# Patient Record
Sex: Female | Born: 1935
Health system: Southern US, Community
[De-identification: ages and names within clinical notes are randomized; demographics above are authoritative.]

## PROBLEM LIST (undated history)

## (undated) DIAGNOSIS — I639 Cerebral infarction, unspecified: Secondary | ICD-10-CM

## (undated) DIAGNOSIS — R011 Cardiac murmur, unspecified: Secondary | ICD-10-CM

## (undated) DIAGNOSIS — I10 Essential (primary) hypertension: Secondary | ICD-10-CM

## (undated) DIAGNOSIS — M1990 Unspecified osteoarthritis, unspecified site: Secondary | ICD-10-CM

## (undated) DIAGNOSIS — E785 Hyperlipidemia, unspecified: Secondary | ICD-10-CM

## (undated) DIAGNOSIS — E119 Type 2 diabetes mellitus without complications: Secondary | ICD-10-CM

## (undated) DIAGNOSIS — R55 Syncope and collapse: Secondary | ICD-10-CM

## (undated) DIAGNOSIS — F03B0 Unspecified dementia, moderate, without behavioral disturbance, psychotic disturbance, mood disturbance, and anxiety: Secondary | ICD-10-CM

## (undated) DIAGNOSIS — M109 Gout, unspecified: Secondary | ICD-10-CM

## (undated) DIAGNOSIS — R32 Unspecified urinary incontinence: Secondary | ICD-10-CM

## (undated) DIAGNOSIS — C801 Malignant (primary) neoplasm, unspecified: Secondary | ICD-10-CM

## (undated) DIAGNOSIS — F0390 Unspecified dementia without behavioral disturbance: Secondary | ICD-10-CM

## (undated) DIAGNOSIS — I4891 Unspecified atrial fibrillation: Secondary | ICD-10-CM

## (undated) DIAGNOSIS — I890 Lymphedema, not elsewhere classified: Secondary | ICD-10-CM

## (undated) DIAGNOSIS — I5020 Unspecified systolic (congestive) heart failure: Secondary | ICD-10-CM

## (undated) DIAGNOSIS — D649 Anemia, unspecified: Secondary | ICD-10-CM

## (undated) DIAGNOSIS — I340 Nonrheumatic mitral (valve) insufficiency: Secondary | ICD-10-CM

## (undated) DIAGNOSIS — R569 Unspecified convulsions: Secondary | ICD-10-CM

## (undated) HISTORY — DX: Unspecified osteoarthritis, unspecified site: M19.90

## (undated) HISTORY — DX: Unspecified urinary incontinence: R32

## (undated) HISTORY — PX: EYE SURGERY: SHX253

---

## 1997-02-19 HISTORY — PX: MASTECTOMY: SHX3

## 1997-08-27 HISTORY — PX: BREAST SURGERY: SHX581

## 1997-08-27 HISTORY — PX: MASTECTOMY: SHX3

## 1998-08-27 HISTORY — PX: CATARACT EXTRACTION: SUR2

## 2004-10-24 ENCOUNTER — Ambulatory Visit: Payer: Self-pay | Admitting: Oncology

## 2005-04-23 ENCOUNTER — Ambulatory Visit: Payer: Self-pay | Admitting: Oncology

## 2005-04-27 ENCOUNTER — Ambulatory Visit: Payer: Self-pay | Admitting: Oncology

## 2005-08-23 ENCOUNTER — Ambulatory Visit: Payer: Self-pay | Admitting: Surgery

## 2005-08-24 ENCOUNTER — Ambulatory Visit: Payer: Self-pay | Admitting: Oncology

## 2005-09-05 ENCOUNTER — Encounter: Payer: Self-pay | Admitting: Surgery

## 2005-09-27 ENCOUNTER — Encounter: Payer: Self-pay | Admitting: Surgery

## 2005-10-22 ENCOUNTER — Ambulatory Visit: Payer: Self-pay | Admitting: Oncology

## 2005-10-25 ENCOUNTER — Ambulatory Visit: Payer: Self-pay | Admitting: Oncology

## 2006-10-22 ENCOUNTER — Ambulatory Visit: Payer: Self-pay | Admitting: Oncology

## 2006-10-26 ENCOUNTER — Ambulatory Visit: Payer: Self-pay | Admitting: Oncology

## 2006-10-30 ENCOUNTER — Ambulatory Visit: Payer: Self-pay | Admitting: Ophthalmology

## 2006-11-06 ENCOUNTER — Ambulatory Visit: Payer: Self-pay | Admitting: Ophthalmology

## 2007-07-01 ENCOUNTER — Ambulatory Visit: Payer: Self-pay | Admitting: Ophthalmology

## 2007-07-01 ENCOUNTER — Other Ambulatory Visit: Payer: Self-pay

## 2007-07-09 ENCOUNTER — Ambulatory Visit: Payer: Self-pay | Admitting: Ophthalmology

## 2007-10-26 ENCOUNTER — Ambulatory Visit: Payer: Self-pay | Admitting: Oncology

## 2007-11-04 ENCOUNTER — Ambulatory Visit: Payer: Self-pay | Admitting: Oncology

## 2007-11-26 ENCOUNTER — Ambulatory Visit: Payer: Self-pay | Admitting: Oncology

## 2008-01-26 ENCOUNTER — Ambulatory Visit: Payer: Self-pay | Admitting: Oncology

## 2008-03-27 ENCOUNTER — Ambulatory Visit: Payer: Self-pay | Admitting: Oncology

## 2009-08-27 DIAGNOSIS — I639 Cerebral infarction, unspecified: Secondary | ICD-10-CM

## 2009-08-27 HISTORY — DX: Cerebral infarction, unspecified: I63.9

## 2010-04-14 ENCOUNTER — Emergency Department: Payer: Self-pay | Admitting: Emergency Medicine

## 2011-02-23 ENCOUNTER — Emergency Department: Payer: Self-pay | Admitting: Emergency Medicine

## 2014-09-07 DIAGNOSIS — I482 Chronic atrial fibrillation, unspecified: Secondary | ICD-10-CM | POA: Insufficient documentation

## 2014-09-07 DIAGNOSIS — R072 Precordial pain: Secondary | ICD-10-CM | POA: Diagnosis not present

## 2014-09-07 DIAGNOSIS — I5020 Unspecified systolic (congestive) heart failure: Secondary | ICD-10-CM | POA: Diagnosis not present

## 2014-09-07 DIAGNOSIS — E782 Mixed hyperlipidemia: Secondary | ICD-10-CM | POA: Diagnosis not present

## 2014-09-07 DIAGNOSIS — I10 Essential (primary) hypertension: Secondary | ICD-10-CM | POA: Diagnosis not present

## 2014-09-28 DIAGNOSIS — E785 Hyperlipidemia, unspecified: Secondary | ICD-10-CM | POA: Diagnosis not present

## 2014-09-28 DIAGNOSIS — I10 Essential (primary) hypertension: Secondary | ICD-10-CM | POA: Diagnosis not present

## 2014-09-28 DIAGNOSIS — E559 Vitamin D deficiency, unspecified: Secondary | ICD-10-CM | POA: Diagnosis not present

## 2014-09-28 DIAGNOSIS — Z23 Encounter for immunization: Secondary | ICD-10-CM | POA: Diagnosis not present

## 2014-10-04 DIAGNOSIS — Z853 Personal history of malignant neoplasm of breast: Secondary | ICD-10-CM | POA: Diagnosis not present

## 2014-10-04 DIAGNOSIS — M549 Dorsalgia, unspecified: Secondary | ICD-10-CM | POA: Diagnosis not present

## 2014-10-04 DIAGNOSIS — M546 Pain in thoracic spine: Secondary | ICD-10-CM | POA: Diagnosis not present

## 2014-10-04 DIAGNOSIS — E119 Type 2 diabetes mellitus without complications: Secondary | ICD-10-CM | POA: Diagnosis not present

## 2014-10-04 DIAGNOSIS — D6489 Other specified anemias: Secondary | ICD-10-CM | POA: Diagnosis not present

## 2014-10-04 DIAGNOSIS — I10 Essential (primary) hypertension: Secondary | ICD-10-CM | POA: Diagnosis not present

## 2014-10-05 DIAGNOSIS — R072 Precordial pain: Secondary | ICD-10-CM | POA: Diagnosis not present

## 2014-10-07 ENCOUNTER — Ambulatory Visit: Payer: Self-pay | Admitting: Internal Medicine

## 2014-10-07 DIAGNOSIS — M546 Pain in thoracic spine: Secondary | ICD-10-CM | POA: Diagnosis not present

## 2014-10-07 DIAGNOSIS — Z853 Personal history of malignant neoplasm of breast: Secondary | ICD-10-CM | POA: Diagnosis not present

## 2015-01-13 DIAGNOSIS — D6489 Other specified anemias: Secondary | ICD-10-CM | POA: Diagnosis not present

## 2015-01-13 DIAGNOSIS — I482 Chronic atrial fibrillation: Secondary | ICD-10-CM | POA: Diagnosis not present

## 2015-01-13 DIAGNOSIS — I10 Essential (primary) hypertension: Secondary | ICD-10-CM | POA: Diagnosis not present

## 2015-01-13 DIAGNOSIS — E119 Type 2 diabetes mellitus without complications: Secondary | ICD-10-CM | POA: Diagnosis not present

## 2015-01-17 ENCOUNTER — Other Ambulatory Visit: Payer: Self-pay | Admitting: Internal Medicine

## 2015-01-17 DIAGNOSIS — E119 Type 2 diabetes mellitus without complications: Secondary | ICD-10-CM | POA: Diagnosis not present

## 2015-01-17 DIAGNOSIS — I482 Chronic atrial fibrillation: Secondary | ICD-10-CM | POA: Diagnosis not present

## 2015-01-17 DIAGNOSIS — I10 Essential (primary) hypertension: Secondary | ICD-10-CM | POA: Diagnosis not present

## 2015-01-17 DIAGNOSIS — M546 Pain in thoracic spine: Secondary | ICD-10-CM

## 2015-01-17 DIAGNOSIS — I5020 Unspecified systolic (congestive) heart failure: Secondary | ICD-10-CM | POA: Diagnosis not present

## 2015-01-19 ENCOUNTER — Ambulatory Visit
Admission: RE | Admit: 2015-01-19 | Discharge: 2015-01-19 | Disposition: A | Discharge status: Home / Self Care | Payer: Self-pay | Source: Ambulatory Visit | Attending: Internal Medicine | Admitting: Internal Medicine

## 2015-02-11 ENCOUNTER — Ambulatory Visit: Admission: RE | Admit: 2015-02-11 | Payer: Commercial Managed Care - HMO | Source: Ambulatory Visit

## 2015-02-12 ENCOUNTER — Ambulatory Visit
Admission: RE | Admit: 2015-02-12 | Discharge: 2015-02-12 | Disposition: A | Discharge status: Home / Self Care | Payer: Commercial Managed Care - HMO | Source: Ambulatory Visit | Attending: Internal Medicine | Admitting: Internal Medicine

## 2015-02-12 DIAGNOSIS — M546 Pain in thoracic spine: Secondary | ICD-10-CM | POA: Diagnosis present

## 2015-02-12 DIAGNOSIS — M47896 Other spondylosis, lumbar region: Secondary | ICD-10-CM | POA: Insufficient documentation

## 2015-02-12 DIAGNOSIS — M5186 Other intervertebral disc disorders, lumbar region: Secondary | ICD-10-CM | POA: Diagnosis not present

## 2015-02-12 DIAGNOSIS — M5124 Other intervertebral disc displacement, thoracic region: Secondary | ICD-10-CM | POA: Diagnosis not present

## 2015-02-12 DIAGNOSIS — M47814 Spondylosis without myelopathy or radiculopathy, thoracic region: Secondary | ICD-10-CM | POA: Diagnosis not present

## 2015-02-17 DIAGNOSIS — E782 Mixed hyperlipidemia: Secondary | ICD-10-CM | POA: Diagnosis not present

## 2015-02-17 DIAGNOSIS — I10 Essential (primary) hypertension: Secondary | ICD-10-CM | POA: Diagnosis not present

## 2015-02-17 DIAGNOSIS — E559 Vitamin D deficiency, unspecified: Secondary | ICD-10-CM | POA: Diagnosis not present

## 2015-02-17 DIAGNOSIS — Z853 Personal history of malignant neoplasm of breast: Secondary | ICD-10-CM | POA: Diagnosis not present

## 2015-02-24 DIAGNOSIS — E119 Type 2 diabetes mellitus without complications: Secondary | ICD-10-CM | POA: Diagnosis not present

## 2015-02-24 DIAGNOSIS — B351 Tinea unguium: Secondary | ICD-10-CM | POA: Diagnosis not present

## 2015-05-13 DIAGNOSIS — E559 Vitamin D deficiency, unspecified: Secondary | ICD-10-CM | POA: Diagnosis not present

## 2015-05-13 DIAGNOSIS — I482 Chronic atrial fibrillation: Secondary | ICD-10-CM | POA: Diagnosis not present

## 2015-05-13 DIAGNOSIS — E119 Type 2 diabetes mellitus without complications: Secondary | ICD-10-CM | POA: Diagnosis not present

## 2015-05-13 DIAGNOSIS — M109 Gout, unspecified: Secondary | ICD-10-CM | POA: Diagnosis not present

## 2015-05-25 DIAGNOSIS — M1990 Unspecified osteoarthritis, unspecified site: Secondary | ICD-10-CM | POA: Diagnosis not present

## 2015-05-25 DIAGNOSIS — E119 Type 2 diabetes mellitus without complications: Secondary | ICD-10-CM | POA: Diagnosis not present

## 2015-05-25 DIAGNOSIS — Z23 Encounter for immunization: Secondary | ICD-10-CM | POA: Diagnosis not present

## 2015-05-25 DIAGNOSIS — M1A00X0 Idiopathic chronic gout, unspecified site, without tophus (tophi): Secondary | ICD-10-CM | POA: Diagnosis not present

## 2015-05-25 DIAGNOSIS — D6489 Other specified anemias: Secondary | ICD-10-CM | POA: Diagnosis not present

## 2015-05-25 DIAGNOSIS — I5020 Unspecified systolic (congestive) heart failure: Secondary | ICD-10-CM | POA: Diagnosis not present

## 2015-05-25 DIAGNOSIS — I482 Chronic atrial fibrillation: Secondary | ICD-10-CM | POA: Diagnosis not present

## 2015-05-25 DIAGNOSIS — I10 Essential (primary) hypertension: Secondary | ICD-10-CM | POA: Diagnosis not present

## 2015-08-24 DIAGNOSIS — B351 Tinea unguium: Secondary | ICD-10-CM | POA: Diagnosis not present

## 2015-08-24 DIAGNOSIS — E119 Type 2 diabetes mellitus without complications: Secondary | ICD-10-CM | POA: Diagnosis not present

## 2015-09-28 ENCOUNTER — Ambulatory Visit (INDEPENDENT_AMBULATORY_CARE_PROVIDER_SITE_OTHER): Payer: Commercial Managed Care - HMO

## 2015-09-28 ENCOUNTER — Encounter: Payer: Self-pay | Admitting: Emergency Medicine

## 2015-09-28 ENCOUNTER — Ambulatory Visit
Admission: EM | Admit: 2015-09-28 | Discharge: 2015-09-28 | Disposition: A | Discharge status: Home / Self Care | Payer: Commercial Managed Care - HMO | Attending: Family Medicine | Admitting: Family Medicine

## 2015-09-28 DIAGNOSIS — M10079 Idiopathic gout, unspecified ankle and foot: Secondary | ICD-10-CM

## 2015-09-28 DIAGNOSIS — M7989 Other specified soft tissue disorders: Secondary | ICD-10-CM | POA: Diagnosis not present

## 2015-09-28 DIAGNOSIS — M79672 Pain in left foot: Secondary | ICD-10-CM | POA: Diagnosis not present

## 2015-09-28 DIAGNOSIS — M109 Gout, unspecified: Secondary | ICD-10-CM

## 2015-09-28 HISTORY — DX: Essential (primary) hypertension: I10

## 2015-09-28 HISTORY — DX: Gout, unspecified: M10.9

## 2015-09-28 HISTORY — DX: Cerebral infarction, unspecified: I63.9

## 2015-09-28 HISTORY — DX: Type 2 diabetes mellitus without complications: E11.9

## 2015-09-28 HISTORY — DX: Malignant (primary) neoplasm, unspecified: C80.1

## 2015-09-28 MED ORDER — PREDNISONE 10 MG PO TABS: 20.0000 mg | ORAL_TABLET | Freq: Every day | ORAL | Status: DC

## 2015-09-28 NOTE — ED Notes (Signed)
Pt presents with left foot pain for two weeks. Unable to bear weight. Pt has hx of gout. No known injury.

## 2015-09-28 NOTE — ED Provider Notes (Signed)
CSN: [id number]     Arrival date & time 09/28/15  1557 History   First MD Initiated Contact with Patient 09/28/15 1625     Chief Complaint  Patient presents with  . Foot Pain   (Consider location/radiation/quality/duration/timing/severity/associated sxs/prior Treatment) HPI: Patient presents today with symptoms of left foot pain and swelling for the last 2 days. Patient states that she has a history of gout. She denies any injury or trauma that she can recall. She does state that weightbearing on the foot is very painful. She did start taking her colchicine this morning. She denies any other joints involved. She denies any abrasions to the area recently. She denies any fever. She denies any new medications recently. She is on hydrochlorothiazide. She also admits to eating red meat recently. She denies any alcohol use.  Past Medical History  Diagnosis Date  . Hypertension   . Diabetes mellitus without complication ([place])   . Gout   . Cancer ([place])   . Stroke [facility])    Past Surgical History  Procedure Laterality Date  . Breast surgery     No family history on file. Social History  Substance Use Topics  . Smoking status: Never Smoker   . Smokeless tobacco: None  . Alcohol Use: No   OB History    No data available     Review of Systems  Allergies  Codeine  Home Medications   Prior to Admission medications   Medication Sig Start Date End Date Taking? Authorizing Provider  amLODipine (NORVASC) 10 MG tablet Take 10 mg by mouth daily.   Yes Historical Provider, MD  cholecalciferol (VITAMIN D) 1000 units tablet Take 1,000 Units by mouth daily.   Yes Historical Provider, MD  ciprofloxacin (CIPRO) 500 MG tablet Take 500 mg by mouth 1 day or 1 dose.   Yes Historical Provider, MD  colchicine 0.6 MG tablet Take 0.6 mg by mouth daily.   Yes Historical Provider, MD  glipiZIDE (GLUCOTROL) 5 MG tablet Take by mouth daily before breakfast.   Yes Historical Provider, MD  hydrochlorothiazide  (HYDRODIURIL) 12.5 MG tablet Take 12.5 mg by mouth daily.   Yes Historical Provider, MD  lovastatin (MEVACOR) 40 MG tablet Take 40 mg by mouth at bedtime.   Yes Historical Provider, MD  Rivaroxaban (XARELTO) 15 MG TABS tablet Take 15 mg by mouth 2 (two) times daily with a meal.   Yes Historical Provider, MD   Meds Ordered and Administered this Visit  Medications - No data to display  BP 157/66 mmHg  Pulse 71  Temp(Src) 98 F (36.7 C) (Oral)  Resp 20  Ht 5\' 2"  (1.575 m)  Wt 139 lb (63.05 kg)  BMI 25.42 kg/m2  SpO2 100% No data found.   Physical Exam   GENERAL: NAD RESP: CTA B CARD: RRR EXTREM: L Foot- mild to moderate erythema, swelling, warmth, tenderness to medial aspect of the midfoot, full range of motion, no drainage from the site, no streaks, no calf tenderness bilaterally   ED Course  Procedures (including critical care time)  Labs Review Labs Reviewed - No data to display  Imaging Review No results found.    MDM  Left foot pain and swelling- x-rays were done which do not show any acute fracture, there are some degenerative changes noted in the midfoot. It does not appear to be an infectious etiology. Will treat patient for gout at this time. She can continue taking the colchicine which she started today or start the oral  prednisone that I have prescribed her. I have prescribed a very low dose of prednisone given the fact that patient is diabetic. I have asked that she drink plenty of water and avoid foods and beverages that can cause gout attacks. She will follow up with Dr. Caryl Comes if needed in the next day or two if symptoms are not improving.    Paulina Fusi, MD 09/28/15 1750

## 2015-10-14 ENCOUNTER — Encounter: Payer: Self-pay | Admitting: *Deleted

## 2015-10-14 ENCOUNTER — Ambulatory Visit
Admission: EM | Admit: 2015-10-14 | Discharge: 2015-10-14 | Disposition: A | Discharge status: Other Acute Inpatient Hospital | Payer: Commercial Managed Care - HMO | Attending: Family Medicine | Admitting: Family Medicine

## 2015-10-14 DIAGNOSIS — K5900 Constipation, unspecified: Secondary | ICD-10-CM | POA: Diagnosis not present

## 2015-10-14 DIAGNOSIS — K625 Hemorrhage of anus and rectum: Secondary | ICD-10-CM

## 2015-10-14 NOTE — Discharge Instructions (Signed)
Constipation, Adult Constipation is when a person:  Poops (has a bowel movement) less than 3 times a week.  Has a hard time pooping.  Has poop that is dry, hard, or bigger than normal. HOME CARE   Eat foods with a lot of fiber in them. This includes fruits, vegetables, beans, and whole grains such as brown rice.  Avoid fatty foods and foods with a lot of sugar. This includes french fries, hamburgers, cookies, candy, and soda.  If you are not getting enough fiber from food, take products with added fiber in them (supplements).  Drink enough fluid to keep your pee (urine) clear or pale yellow.  Exercise on a regular basis, or as told by your doctor.  Go to the restroom when you feel like you need to poop. Do not hold it.  Only take medicine as told by your doctor. Do not take medicines that help you poop (laxatives) without talking to your doctor first. GET HELP RIGHT AWAY IF:   You have bright red blood in your poop (stool).  Your constipation lasts more than 4 days or gets worse.  You have belly (abdominal) or butt (rectal) pain.  You have thin poop (as thin as a pencil).  You lose weight, and it cannot be explained. MAKE SURE YOU:   Understand these instructions.  Will watch your condition.  Will get help right away if you are not doing well or get worse.   This information is not intended to replace advice given to you by your health care provider. Make sure you discuss any questions you have with your health care provider.   Document Released: 01/30/2008 Document Revised: 09/03/2014 Document Reviewed: 05/25/2013 Elsevier Interactive Patient Education 2016 [employer]  

## 2015-10-14 NOTE — ED Provider Notes (Signed)
CSN: [id number]     Arrival date & time 10/14/15  1607 History   First MD Initiated Contact with Patient 10/14/15 1831    Nurses notes were reviewed. Chief Complaint  Patient presents with  . Rectal Bleeding  . Rectal Pain   patient is here because of rectal bleeding according to her daughter. Her daughter reports that her mother has been constant for several days. She able to produce a laxative which finally helped her bowels move. Once her bowels finally did move according to her mother reports heavy massive amount of bleeding this afternoon. The bleeding has stopped she denies any abdominal pain but the daughter Shelley Cameron had called her PCP and PCP suggested that she be evaluated.  PCP nurse felt that she should be evaluated at urgent care and they came here. Daughter is not sure why she came to the urgent care was directed to the urgent care that she didn't think we can do anything to help her and her mother is probably need to have some type of abdominal evaluation. I'm in agreement with the daughter and is October the daughter about seeing rectal bleeding square the bleeding starts back up and to be quite massive. Another concern is of the mother has never had a colonoscopy the daughter has been not the mother and now we have a 35 year old black female who's had rectal bleeding constipation and never had a colonoscopy. (Consider location/radiation/quality/duration/timing/severity/associated sxs/prior Treatment) Patient is a 82 y.o. female presenting with hematochezia. The history is provided by the patient. No language interpreter was used.  Rectal Bleeding Quality:  Bright red Amount:  Moderate Progression:  Worsening Chronicity:  New Context: constipation   Similar prior episodes: no   Relieved by:  Nothing Ineffective treatments:  None tried Associated symptoms: hematemesis   Associated symptoms: no abdominal pain, no fever, no light-headedness, no loss of consciousness and no recent  illness   Risk factors: no hx of colorectal cancer and no hx of colorectal surgery     Past Medical History  Diagnosis Date  . Hypertension   . Diabetes mellitus without complication ([place])   . Gout   . Cancer ([place])   . Stroke [facility])    Past Surgical History  Procedure Laterality Date  . Breast surgery     History reviewed. No pertinent family history. Social History  Substance Use Topics  . Smoking status: Never Smoker   . Smokeless tobacco: None  . Alcohol Use: No   OB History    No data available     Review of Systems  Constitutional: Negative for fever.  Gastrointestinal: Positive for hematochezia and hematemesis. Negative for abdominal pain.  Neurological: Negative for loss of consciousness and light-headedness.  All other systems reviewed and are negative.   Allergies  Codeine  Home Medications   Prior to Admission medications   Medication Sig Start Date End Date Taking? Authorizing Provider  amLODipine (NORVASC) 10 MG tablet Take 10 mg by mouth daily.   Yes Historical Provider, MD  cholecalciferol (VITAMIN D) 1000 units tablet Take 1,000 Units by mouth daily.   Yes Historical Provider, MD  colchicine 0.6 MG tablet Take 0.6 mg by mouth daily.   Yes Historical Provider, MD  glipiZIDE (GLUCOTROL) 5 MG tablet Take by mouth daily before breakfast.   Yes Historical Provider, MD  hydrochlorothiazide (HYDRODIURIL) 12.5 MG tablet Take 12.5 mg by mouth daily.   Yes Historical Provider, MD  lovastatin (MEVACOR) 40 MG tablet Take 40 mg by mouth at  bedtime.   Yes Historical Provider, MD  Rivaroxaban (XARELTO) 15 MG TABS tablet Take 15 mg by mouth 2 (two) times daily with a meal.   Yes Historical Provider, MD  ciprofloxacin (CIPRO) 500 MG tablet Take 500 mg by mouth 1 day or 1 dose.    Historical Provider, MD  predniSONE (DELTASONE) 10 MG tablet Take 2 tablets (20 mg total) by mouth daily. 09/28/15   Paulina Fusi, MD   Meds Ordered and Administered this Visit  Medications -  No data to display  BP 130/78 mmHg  Pulse 88  Temp(Src) 99.3 F (37.4 C) (Oral)  Resp 16  Ht 5\' 2"  (1.575 m)  Wt 140 lb (63.504 kg)  BMI 25.60 kg/m2  SpO2 99% No data found.   Physical Exam  Constitutional: She is oriented to person, place, and time. She appears well-developed and well-nourished.  HENT:  Head: Normocephalic.  Eyes: Conjunctivae are normal. Pupils are equal, round, and reactive to light.  Neck: Normal range of motion.  Cardiovascular: Normal rate and regular rhythm.   Pulmonary/Chest: Effort normal.  Abdominal: Soft. She exhibits no distension. There is no tenderness.  Musculoskeletal: Normal range of motion.  Neurological: She is alert and oriented to person, place, and time.  Skin: Skin is warm and dry.  Vitals reviewed.   ED Course  Procedures (including critical care time)  Labs Review Labs Reviewed - No data to display  Imaging Review No results found.   Visual Acuity Review  Right Eye Distance:   Left Eye Distance:   Bilateral Distance:    Right Eye Near:   Left Eye Near:    Bilateral Near:         MDM   1. Bright red rectal bleeding   2. Constipation, unspecified constipation type    because of the bright red bleeding history constipation I agree with the daughter that more extensive evaluation is probably warranted needed. We talked about going to the emergency room of choice and elected to go to [facility] ED for evaluation and treatment. We discussed about the best way to get there and patient will be taken there tonight.    Frederich Cha, MD 10/14/15 [phone]

## 2015-10-14 NOTE — ED Notes (Signed)
Onset of rectal bleeding and pain today, states constipation yesterday with laxative use and difficult bowel movements.

## 2015-10-17 DIAGNOSIS — I5020 Unspecified systolic (congestive) heart failure: Secondary | ICD-10-CM | POA: Diagnosis not present

## 2015-10-17 DIAGNOSIS — R1084 Generalized abdominal pain: Secondary | ICD-10-CM | POA: Diagnosis not present

## 2015-10-17 DIAGNOSIS — I10 Essential (primary) hypertension: Secondary | ICD-10-CM | POA: Diagnosis not present

## 2015-10-17 DIAGNOSIS — R109 Unspecified abdominal pain: Secondary | ICD-10-CM | POA: Diagnosis not present

## 2015-10-17 DIAGNOSIS — I482 Chronic atrial fibrillation: Secondary | ICD-10-CM | POA: Diagnosis not present

## 2015-10-17 DIAGNOSIS — K922 Gastrointestinal hemorrhage, unspecified: Secondary | ICD-10-CM | POA: Diagnosis not present

## 2015-10-17 DIAGNOSIS — Z853 Personal history of malignant neoplasm of breast: Secondary | ICD-10-CM | POA: Diagnosis not present

## 2015-10-17 DIAGNOSIS — E119 Type 2 diabetes mellitus without complications: Secondary | ICD-10-CM | POA: Diagnosis not present

## 2015-11-15 DIAGNOSIS — E119 Type 2 diabetes mellitus without complications: Secondary | ICD-10-CM | POA: Diagnosis not present

## 2015-11-15 DIAGNOSIS — I482 Chronic atrial fibrillation: Secondary | ICD-10-CM | POA: Diagnosis not present

## 2015-11-15 DIAGNOSIS — E559 Vitamin D deficiency, unspecified: Secondary | ICD-10-CM | POA: Diagnosis not present

## 2015-11-15 DIAGNOSIS — I10 Essential (primary) hypertension: Secondary | ICD-10-CM | POA: Diagnosis not present

## 2015-11-22 DIAGNOSIS — E782 Mixed hyperlipidemia: Secondary | ICD-10-CM | POA: Diagnosis not present

## 2015-11-22 DIAGNOSIS — I10 Essential (primary) hypertension: Secondary | ICD-10-CM | POA: Diagnosis not present

## 2015-11-22 DIAGNOSIS — Z23 Encounter for immunization: Secondary | ICD-10-CM | POA: Diagnosis not present

## 2015-11-22 DIAGNOSIS — I340 Nonrheumatic mitral (valve) insufficiency: Secondary | ICD-10-CM | POA: Diagnosis not present

## 2015-11-22 DIAGNOSIS — I5020 Unspecified systolic (congestive) heart failure: Secondary | ICD-10-CM | POA: Diagnosis not present

## 2015-11-22 DIAGNOSIS — D6489 Other specified anemias: Secondary | ICD-10-CM | POA: Diagnosis not present

## 2015-11-22 DIAGNOSIS — E119 Type 2 diabetes mellitus without complications: Secondary | ICD-10-CM | POA: Diagnosis not present

## 2015-11-22 DIAGNOSIS — I482 Chronic atrial fibrillation: Secondary | ICD-10-CM | POA: Diagnosis not present

## 2015-11-22 DIAGNOSIS — E559 Vitamin D deficiency, unspecified: Secondary | ICD-10-CM | POA: Diagnosis not present

## 2016-01-01 DIAGNOSIS — E119 Type 2 diabetes mellitus without complications: Secondary | ICD-10-CM | POA: Diagnosis not present

## 2016-01-10 ENCOUNTER — Other Ambulatory Visit: Payer: Self-pay | Admitting: Internal Medicine

## 2016-01-10 DIAGNOSIS — E119 Type 2 diabetes mellitus without complications: Secondary | ICD-10-CM | POA: Diagnosis not present

## 2016-01-10 DIAGNOSIS — I10 Essential (primary) hypertension: Secondary | ICD-10-CM | POA: Diagnosis not present

## 2016-01-10 DIAGNOSIS — R600 Localized edema: Secondary | ICD-10-CM | POA: Diagnosis not present

## 2016-01-10 DIAGNOSIS — I5020 Unspecified systolic (congestive) heart failure: Secondary | ICD-10-CM | POA: Diagnosis not present

## 2016-01-10 DIAGNOSIS — I482 Chronic atrial fibrillation: Secondary | ICD-10-CM | POA: Diagnosis not present

## 2016-01-10 DIAGNOSIS — M79671 Pain in right foot: Secondary | ICD-10-CM

## 2016-01-10 DIAGNOSIS — M1A00X0 Idiopathic chronic gout, unspecified site, without tophus (tophi): Secondary | ICD-10-CM | POA: Diagnosis not present

## 2016-01-12 ENCOUNTER — Ambulatory Visit
Admission: RE | Admit: 2016-01-12 | Discharge: 2016-01-12 | Disposition: A | Discharge status: Home / Self Care | Payer: Commercial Managed Care - HMO | Source: Ambulatory Visit | Attending: Internal Medicine | Admitting: Internal Medicine

## 2016-01-12 DIAGNOSIS — R600 Localized edema: Secondary | ICD-10-CM

## 2016-01-12 DIAGNOSIS — M79671 Pain in right foot: Secondary | ICD-10-CM | POA: Diagnosis not present

## 2016-01-19 DIAGNOSIS — M19071 Primary osteoarthritis, right ankle and foot: Secondary | ICD-10-CM | POA: Diagnosis not present

## 2016-01-19 DIAGNOSIS — M25571 Pain in right ankle and joints of right foot: Secondary | ICD-10-CM | POA: Diagnosis not present

## 2016-02-14 DIAGNOSIS — Z853 Personal history of malignant neoplasm of breast: Secondary | ICD-10-CM | POA: Diagnosis not present

## 2016-02-14 DIAGNOSIS — E119 Type 2 diabetes mellitus without complications: Secondary | ICD-10-CM | POA: Diagnosis not present

## 2016-02-14 DIAGNOSIS — I10 Essential (primary) hypertension: Secondary | ICD-10-CM | POA: Diagnosis not present

## 2016-02-14 DIAGNOSIS — E559 Vitamin D deficiency, unspecified: Secondary | ICD-10-CM | POA: Diagnosis not present

## 2016-02-14 DIAGNOSIS — I482 Chronic atrial fibrillation: Secondary | ICD-10-CM | POA: Diagnosis not present

## 2016-02-14 DIAGNOSIS — E782 Mixed hyperlipidemia: Secondary | ICD-10-CM | POA: Diagnosis not present

## 2016-02-14 DIAGNOSIS — D6489 Other specified anemias: Secondary | ICD-10-CM | POA: Diagnosis not present

## 2016-02-14 DIAGNOSIS — M1A00X0 Idiopathic chronic gout, unspecified site, without tophus (tophi): Secondary | ICD-10-CM | POA: Diagnosis not present

## 2016-02-14 DIAGNOSIS — R413 Other amnesia: Secondary | ICD-10-CM | POA: Diagnosis not present

## 2016-02-15 ENCOUNTER — Other Ambulatory Visit: Payer: Self-pay | Admitting: Internal Medicine

## 2016-02-15 DIAGNOSIS — R413 Other amnesia: Secondary | ICD-10-CM

## 2016-02-20 DIAGNOSIS — E119 Type 2 diabetes mellitus without complications: Secondary | ICD-10-CM | POA: Diagnosis not present

## 2016-02-20 DIAGNOSIS — B351 Tinea unguium: Secondary | ICD-10-CM | POA: Diagnosis not present

## 2016-02-23 ENCOUNTER — Ambulatory Visit: Payer: Commercial Managed Care - HMO | Attending: Internal Medicine

## 2016-03-05 ENCOUNTER — Ambulatory Visit
Admission: RE | Admit: 2016-03-05 | Discharge: 2016-03-05 | Disposition: A | Discharge status: Home / Self Care | Payer: Commercial Managed Care - HMO | Source: Ambulatory Visit | Attending: Internal Medicine | Admitting: Internal Medicine

## 2016-03-05 DIAGNOSIS — R413 Other amnesia: Secondary | ICD-10-CM | POA: Insufficient documentation

## 2016-03-05 DIAGNOSIS — I739 Peripheral vascular disease, unspecified: Secondary | ICD-10-CM | POA: Diagnosis not present

## 2016-03-05 DIAGNOSIS — G9389 Other specified disorders of brain: Secondary | ICD-10-CM | POA: Insufficient documentation

## 2016-03-05 DIAGNOSIS — G319 Degenerative disease of nervous system, unspecified: Secondary | ICD-10-CM | POA: Insufficient documentation

## 2016-04-10 DIAGNOSIS — I340 Nonrheumatic mitral (valve) insufficiency: Secondary | ICD-10-CM | POA: Diagnosis not present

## 2016-04-10 DIAGNOSIS — E119 Type 2 diabetes mellitus without complications: Secondary | ICD-10-CM | POA: Diagnosis not present

## 2016-04-10 DIAGNOSIS — I482 Chronic atrial fibrillation: Secondary | ICD-10-CM | POA: Diagnosis not present

## 2016-04-10 DIAGNOSIS — E782 Mixed hyperlipidemia: Secondary | ICD-10-CM | POA: Diagnosis not present

## 2016-04-10 DIAGNOSIS — I5020 Unspecified systolic (congestive) heart failure: Secondary | ICD-10-CM | POA: Diagnosis not present

## 2016-04-10 DIAGNOSIS — I10 Essential (primary) hypertension: Secondary | ICD-10-CM | POA: Diagnosis not present

## 2016-04-25 DIAGNOSIS — F0390 Unspecified dementia without behavioral disturbance: Secondary | ICD-10-CM | POA: Diagnosis not present

## 2016-04-25 DIAGNOSIS — E538 Deficiency of other specified B group vitamins: Secondary | ICD-10-CM | POA: Diagnosis not present

## 2016-05-17 DIAGNOSIS — M109 Gout, unspecified: Secondary | ICD-10-CM | POA: Diagnosis not present

## 2016-05-17 DIAGNOSIS — I10 Essential (primary) hypertension: Secondary | ICD-10-CM | POA: Diagnosis not present

## 2016-05-17 DIAGNOSIS — E119 Type 2 diabetes mellitus without complications: Secondary | ICD-10-CM | POA: Diagnosis not present

## 2016-05-17 DIAGNOSIS — D6489 Other specified anemias: Secondary | ICD-10-CM | POA: Diagnosis not present

## 2016-05-17 DIAGNOSIS — I482 Chronic atrial fibrillation: Secondary | ICD-10-CM | POA: Diagnosis not present

## 2016-05-21 DIAGNOSIS — D6489 Other specified anemias: Secondary | ICD-10-CM | POA: Diagnosis not present

## 2016-05-21 DIAGNOSIS — E119 Type 2 diabetes mellitus without complications: Secondary | ICD-10-CM | POA: Diagnosis not present

## 2016-05-21 DIAGNOSIS — R808 Other proteinuria: Secondary | ICD-10-CM | POA: Diagnosis not present

## 2016-05-21 DIAGNOSIS — I482 Chronic atrial fibrillation: Secondary | ICD-10-CM | POA: Diagnosis not present

## 2016-05-21 DIAGNOSIS — M109 Gout, unspecified: Secondary | ICD-10-CM | POA: Diagnosis not present

## 2016-05-21 DIAGNOSIS — I10 Essential (primary) hypertension: Secondary | ICD-10-CM | POA: Diagnosis not present

## 2016-05-24 DIAGNOSIS — I482 Chronic atrial fibrillation: Secondary | ICD-10-CM | POA: Diagnosis not present

## 2016-05-24 DIAGNOSIS — M1A00X0 Idiopathic chronic gout, unspecified site, without tophus (tophi): Secondary | ICD-10-CM | POA: Diagnosis not present

## 2016-05-24 DIAGNOSIS — E559 Vitamin D deficiency, unspecified: Secondary | ICD-10-CM | POA: Diagnosis not present

## 2016-05-24 DIAGNOSIS — I10 Essential (primary) hypertension: Secondary | ICD-10-CM | POA: Diagnosis not present

## 2016-05-24 DIAGNOSIS — I340 Nonrheumatic mitral (valve) insufficiency: Secondary | ICD-10-CM | POA: Diagnosis not present

## 2016-05-24 DIAGNOSIS — E782 Mixed hyperlipidemia: Secondary | ICD-10-CM | POA: Diagnosis not present

## 2016-05-24 DIAGNOSIS — E119 Type 2 diabetes mellitus without complications: Secondary | ICD-10-CM | POA: Diagnosis not present

## 2016-05-24 DIAGNOSIS — F0390 Unspecified dementia without behavioral disturbance: Secondary | ICD-10-CM | POA: Diagnosis not present

## 2016-05-24 DIAGNOSIS — I5020 Unspecified systolic (congestive) heart failure: Secondary | ICD-10-CM | POA: Diagnosis not present

## 2016-05-24 DIAGNOSIS — Z23 Encounter for immunization: Secondary | ICD-10-CM | POA: Diagnosis not present

## 2016-05-30 ENCOUNTER — Emergency Department: Payer: Commercial Managed Care - HMO

## 2016-05-30 ENCOUNTER — Encounter: Payer: Self-pay | Admitting: *Deleted

## 2016-05-30 ENCOUNTER — Observation Stay
Admission: EM | Admit: 2016-05-30 | Discharge: 2016-06-01 | Disposition: A | Discharge status: Home / Self Care | Payer: Commercial Managed Care - HMO | Attending: Internal Medicine | Admitting: Internal Medicine

## 2016-05-30 DIAGNOSIS — Z7901 Long term (current) use of anticoagulants: Secondary | ICD-10-CM | POA: Diagnosis not present

## 2016-05-30 DIAGNOSIS — Z792 Long term (current) use of antibiotics: Secondary | ICD-10-CM | POA: Insufficient documentation

## 2016-05-30 DIAGNOSIS — E785 Hyperlipidemia, unspecified: Secondary | ICD-10-CM | POA: Diagnosis not present

## 2016-05-30 DIAGNOSIS — Z79899 Other long term (current) drug therapy: Secondary | ICD-10-CM | POA: Insufficient documentation

## 2016-05-30 DIAGNOSIS — E119 Type 2 diabetes mellitus without complications: Secondary | ICD-10-CM | POA: Insufficient documentation

## 2016-05-30 DIAGNOSIS — R079 Chest pain, unspecified: Secondary | ICD-10-CM | POA: Diagnosis not present

## 2016-05-30 DIAGNOSIS — Z8673 Personal history of transient ischemic attack (TIA), and cerebral infarction without residual deficits: Secondary | ICD-10-CM | POA: Diagnosis not present

## 2016-05-30 DIAGNOSIS — M109 Gout, unspecified: Secondary | ICD-10-CM | POA: Insufficient documentation

## 2016-05-30 DIAGNOSIS — R0602 Shortness of breath: Secondary | ICD-10-CM | POA: Diagnosis not present

## 2016-05-30 DIAGNOSIS — L308 Other specified dermatitis: Secondary | ICD-10-CM | POA: Diagnosis not present

## 2016-05-30 DIAGNOSIS — R0789 Other chest pain: Principal | ICD-10-CM | POA: Insufficient documentation

## 2016-05-30 DIAGNOSIS — Z7984 Long term (current) use of oral hypoglycemic drugs: Secondary | ICD-10-CM | POA: Diagnosis not present

## 2016-05-30 DIAGNOSIS — F0390 Unspecified dementia without behavioral disturbance: Secondary | ICD-10-CM | POA: Insufficient documentation

## 2016-05-30 DIAGNOSIS — Z853 Personal history of malignant neoplasm of breast: Secondary | ICD-10-CM | POA: Diagnosis not present

## 2016-05-30 DIAGNOSIS — I482 Chronic atrial fibrillation: Secondary | ICD-10-CM | POA: Diagnosis not present

## 2016-05-30 DIAGNOSIS — N9089 Other specified noninflammatory disorders of vulva and perineum: Secondary | ICD-10-CM | POA: Diagnosis not present

## 2016-05-30 DIAGNOSIS — G8918 Other acute postprocedural pain: Secondary | ICD-10-CM | POA: Diagnosis not present

## 2016-05-30 DIAGNOSIS — I110 Hypertensive heart disease with heart failure: Secondary | ICD-10-CM | POA: Diagnosis not present

## 2016-05-30 DIAGNOSIS — I5022 Chronic systolic (congestive) heart failure: Secondary | ICD-10-CM | POA: Insufficient documentation

## 2016-05-30 DIAGNOSIS — Z7952 Long term (current) use of systemic steroids: Secondary | ICD-10-CM | POA: Insufficient documentation

## 2016-05-30 DIAGNOSIS — I10 Essential (primary) hypertension: Secondary | ICD-10-CM | POA: Diagnosis not present

## 2016-05-30 HISTORY — DX: Unspecified systolic (congestive) heart failure: I50.20

## 2016-05-30 HISTORY — DX: Unspecified dementia without behavioral disturbance: F03.90

## 2016-05-30 HISTORY — DX: Unspecified dementia, moderate, without behavioral disturbance, psychotic disturbance, mood disturbance, and anxiety: F03.B0

## 2016-05-30 HISTORY — DX: Nonrheumatic mitral (valve) insufficiency: I34.0

## 2016-05-30 HISTORY — DX: Unspecified atrial fibrillation: I48.91

## 2016-05-30 LAB — GLUCOSE, CAPILLARY: Glucose-Capillary: 103 mg/dL — ABNORMAL HIGH (ref 65–99)

## 2016-05-30 LAB — BASIC METABOLIC PANEL
ANION GAP: 9 (ref 5–15)
BUN: 24 mg/dL — ABNORMAL HIGH (ref 6–20)
CALCIUM: 9.2 mg/dL (ref 8.9–10.3)
CO2: 31 mmol/L (ref 22–32)
Chloride: 101 mmol/L (ref 101–111)
Creatinine, Ser: 1.25 mg/dL — ABNORMAL HIGH (ref 0.44–1.00)
GFR calc non Af Amer: 39 mL/min — ABNORMAL LOW (ref >60)
GFR, EST AFRICAN AMERICAN: 45 mL/min — AB (ref >60)
Glucose, Bld: 113 mg/dL — ABNORMAL HIGH (ref 65–99)
Potassium: 3.1 mmol/L — ABNORMAL LOW (ref 3.5–5.1)
SODIUM: 141 mmol/L (ref 135–145)

## 2016-05-30 LAB — CBC
HCT: 40 % (ref 35.0–47.0)
HEMOGLOBIN: 13.8 g/dL (ref 12.0–16.0)
MCH: 28.7 pg (ref 26.0–34.0)
MCHC: 34.4 g/dL (ref 32.0–36.0)
MCV: 83.4 fL (ref 80.0–100.0)
Platelets: 202 10*3/uL (ref 150–440)
RBC: 4.79 MIL/uL (ref 3.80–5.20)
RDW: 15 % — ABNORMAL HIGH (ref 11.5–14.5)
WBC: 9.7 10*3/uL (ref 3.6–11.0)

## 2016-05-30 LAB — TROPONIN I

## 2016-05-30 MED ORDER — SODIUM CHLORIDE 0.9% FLUSH: 3.0000 mL | Freq: Two times a day (BID) | INTRAVENOUS | Status: DC

## 2016-05-30 MED ORDER — POTASSIUM CHLORIDE CRYS ER 20 MEQ PO TBCR
40.0000 meq | EXTENDED_RELEASE_TABLET | Freq: Once | ORAL | Status: AC
  Administered 2016-05-30: 40 meq via ORAL
  Filled 2016-05-30: qty 2

## 2016-05-30 MED ORDER — INSULIN ASPART 100 UNIT/ML ~~LOC~~ SOLN: 0.0000 [IU] | Freq: Three times a day (TID) | SUBCUTANEOUS | Status: DC

## 2016-05-30 MED ORDER — ALBUTEROL SULFATE (2.5 MG/3ML) 0.083% IN NEBU: 2.5000 mg | INHALATION_SOLUTION | RESPIRATORY_TRACT | Status: DC | PRN

## 2016-05-30 MED ORDER — RIVAROXABAN 15 MG PO TABS
15.0000 mg | ORAL_TABLET | Freq: Two times a day (BID) | ORAL | Status: DC
  Administered 2016-05-31: 15 mg via ORAL
  Filled 2016-05-30: qty 1

## 2016-05-30 MED ORDER — INSULIN ASPART 100 UNIT/ML ~~LOC~~ SOLN: 0.0000 [IU] | Freq: Every day | SUBCUTANEOUS | Status: DC

## 2016-05-30 MED ORDER — ACETAMINOPHEN 650 MG RE SUPP: 650.0000 mg | Freq: Four times a day (QID) | RECTAL | Status: DC | PRN

## 2016-05-30 MED ORDER — SODIUM CHLORIDE 0.9% FLUSH
3.0000 mL | Freq: Two times a day (BID) | INTRAVENOUS | Status: DC
  Administered 2016-05-30 – 2016-06-01 (×4): 3 mL via INTRAVENOUS

## 2016-05-30 MED ORDER — HYDROCODONE-ACETAMINOPHEN 5-325 MG PO TABS: 1.0000 | ORAL_TABLET | ORAL | Status: DC | PRN

## 2016-05-30 MED ORDER — METOPROLOL SUCCINATE ER 25 MG PO TB24
25.0000 mg | ORAL_TABLET | Freq: Every day | ORAL | Status: DC
  Administered 2016-05-30: 25 mg via ORAL
  Filled 2016-05-30: qty 1

## 2016-05-30 MED ORDER — SODIUM CHLORIDE 0.9% FLUSH: 3.0000 mL | INTRAVENOUS | Status: DC | PRN

## 2016-05-30 MED ORDER — GLIPIZIDE 5 MG PO TABS
5.0000 mg | ORAL_TABLET | Freq: Every day | ORAL | Status: DC
  Administered 2016-05-31 – 2016-06-01 (×2): 5 mg via ORAL
  Filled 2016-05-30 (×2): qty 1

## 2016-05-30 MED ORDER — POLYETHYLENE GLYCOL 3350 17 G PO PACK: 17.0000 g | PACK | Freq: Every day | ORAL | Status: DC | PRN

## 2016-05-30 MED ORDER — SODIUM CHLORIDE 0.9 % IV SOLN: 250.0000 mL | INTRAVENOUS | Status: DC | PRN

## 2016-05-30 MED ORDER — AMLODIPINE BESYLATE 10 MG PO TABS
10.0000 mg | ORAL_TABLET | Freq: Every day | ORAL | Status: DC
  Administered 2016-05-30 – 2016-05-31 (×2): 10 mg via ORAL
  Filled 2016-05-30 (×2): qty 1

## 2016-05-30 MED ORDER — PRAVASTATIN SODIUM 20 MG PO TABS
20.0000 mg | ORAL_TABLET | Freq: Every day | ORAL | Status: DC
  Administered 2016-05-31: 20 mg via ORAL
  Filled 2016-05-30: qty 1

## 2016-05-30 MED ORDER — NITROGLYCERIN 0.4 MG SL SUBL: 0.4000 mg | SUBLINGUAL_TABLET | SUBLINGUAL | Status: DC | PRN

## 2016-05-30 MED ORDER — DONEPEZIL HCL 5 MG PO TABS
5.0000 mg | ORAL_TABLET | Freq: Every day | ORAL | Status: DC
  Administered 2016-05-30 – 2016-05-31 (×2): 5 mg via ORAL
  Filled 2016-05-30 (×2): qty 1

## 2016-05-30 MED ORDER — ACETAMINOPHEN 325 MG PO TABS
650.0000 mg | ORAL_TABLET | Freq: Four times a day (QID) | ORAL | Status: DC | PRN
  Administered 2016-05-31 (×3): 650 mg via ORAL
  Filled 2016-05-30 (×3): qty 2

## 2016-05-30 MED ORDER — BISACODYL 10 MG RE SUPP
10.0000 mg | Freq: Every day | RECTAL | Status: DC | PRN
  Filled 2016-05-30: qty 1

## 2016-05-30 MED ORDER — ASPIRIN EC 81 MG PO TBEC
81.0000 mg | DELAYED_RELEASE_TABLET | Freq: Every day | ORAL | Status: DC
  Administered 2016-05-30 – 2016-06-01 (×3): 81 mg via ORAL
  Filled 2016-05-30 (×3): qty 1

## 2016-05-30 NOTE — ED Triage Notes (Signed)
Pt to triage via wheelchair. Pt had chest pain earlier today.  No pain on arrival to triage.  No sob. nonradiaiting pain.  No n/v/d. No diaphoresis.  Pt alert. Pt had vaginal biopsy today.

## 2016-05-30 NOTE — ED Provider Notes (Signed)
[facility] Emergency Department Provider Note    First MD Initiated Contact with Patient 05/30/16 1703     (approximate)  I have reviewed the triage vital signs and the nursing notes.   HISTORY  Chief Complaint Chest Pain    HPI Shelley Cameron is a 82 y.o. female with history of breast cancer presents with brief right-sided chest pain and pressure that started at 4 PM. Patient had a vaginal biopsy performed in clinic today. While going of clinic patient was complaining of shortness of breath and chest pain. States the pain lasted roughly 5-10 minutes and was while at rest. Daughter witnessed the episode states that she did look very uncomfortable but did not have any diaphoresis nausea or vomiting. No recent fevers. She is on Xarelto. Has been taking medications as prescribed. Currently denies any chest pain or pressure.   Past Medical History:  Diagnosis Date  . Cancer ([place])   . Diabetes mellitus without complication ([place])   . Gout   . Hypertension   . Stroke [facility])     There are no active problems to display for this patient.   Past Surgical History:  Procedure Laterality Date  . BREAST SURGERY      Prior to Admission medications   Medication Sig Start Date End Date Taking? Authorizing Provider  amLODipine (NORVASC) 10 MG tablet Take 10 mg by mouth daily.    Historical Provider, MD  cholecalciferol (VITAMIN D) 1000 units tablet Take 1,000 Units by mouth daily.    Historical Provider, MD  ciprofloxacin (CIPRO) 500 MG tablet Take 500 mg by mouth 1 day or 1 dose.    Historical Provider, MD  colchicine 0.6 MG tablet Take 0.6 mg by mouth daily.    Historical Provider, MD  glipiZIDE (GLUCOTROL) 5 MG tablet Take by mouth daily before breakfast.    Historical Provider, MD  hydrochlorothiazide (HYDRODIURIL) 12.5 MG tablet Take 12.5 mg by mouth daily.    Historical Provider, MD  lovastatin (MEVACOR) 40 MG tablet Take 40 mg by mouth at bedtime.     Historical Provider, MD  predniSONE (DELTASONE) 10 MG tablet Take 2 tablets (20 mg total) by mouth daily. 09/28/15   Paulina Fusi, MD  Rivaroxaban (XARELTO) 15 MG TABS tablet Take 15 mg by mouth 2 (two) times daily with a meal.    Historical Provider, MD    Allergies Codeine  No family history on file.  Social History Social History  Substance Use Topics  . Smoking status: Never Smoker  . Smokeless tobacco: Never Used  . Alcohol use No    Review of Systems Patient denies headaches, rhinorrhea, blurry vision, numbness, shortness of breath, chest pain, edema, cough, abdominal pain, nausea, vomiting, diarrhea, dysuria, fevers, rashes or hallucinations unless otherwise stated above in HPI. ____________________________________________   PHYSICAL EXAM:  VITAL SIGNS: Vitals:   05/30/16 1637 05/30/16 1730  BP: (!) 149/72 120/75  Pulse: 62 (!) 51  Resp: 20 (!) 21  Temp: 98 F (36.7 C)     Constitutional: Alert and oriented. Well appearing and in no acute distress. Eyes: Conjunctivae are normal. PERRL. EOMI. Head: Atraumatic. Nose: No congestion/rhinnorhea. Mouth/Throat: Mucous membranes are moist.  Oropharynx non-erythematous. Neck: No stridor. Painless ROM. No cervical spine tenderness to palpation Hematological/Lymphatic/Immunilogical: No cervical lymphadenopathy. Cardiovascular: Normal rate, regular rhythm. Grossly normal heart sounds.  Good peripheral circulation. Respiratory: Normal respiratory effort.  No retractions. Lungs CTAB. Gastrointestinal: Soft and nontender. No distention. No abdominal bruits. No CVA tenderness.  Genitourinary:  Musculoskeletal: No lower extremity tenderness nor edema.  No joint effusions. Neurologic:  Normal speech and language. No gross focal neurologic deficits are appreciated. No gait instability. Skin:  Skin is warm, dry and intact. No rash noted. Psychiatric: Mood and affect are normal. Speech and behavior are  normal.  ____________________________________________   LABS (all labs ordered are listed, but only abnormal results are displayed)  Results for orders placed or performed during the hospital encounter of 05/30/16 (from the past 24 hour(s))  Basic metabolic panel     Status: Abnormal   Collection Time: 05/30/16  4:41 PM  Result Value Ref Range   Sodium 141 135 - 145 mmol/L   Potassium 3.1 (L) 3.5 - 5.1 mmol/L   Chloride 101 101 - 111 mmol/L   CO2 31 22 - 32 mmol/L   Glucose, Bld 113 (H) 65 - 99 mg/dL   BUN 24 (H) 6 - 20 mg/dL   Creatinine, Ser 1.25 (H) 0.44 - 1.00 mg/dL   Calcium 9.2 8.9 - 10.3 mg/dL   GFR calc non Af Amer 39 (L) >60 mL/min   GFR calc Af Amer 45 (L) >60 mL/min   Anion gap 9 5 - 15  CBC     Status: Abnormal   Collection Time: 05/30/16  4:41 PM  Result Value Ref Range   WBC 9.7 3.6 - 11.0 K/uL   RBC 4.79 3.80 - 5.20 MIL/uL   Hemoglobin 13.8 12.0 - 16.0 g/dL   HCT 40.0 35.0 - 47.0 %   MCV 83.4 80.0 - 100.0 fL   MCH 28.7 26.0 - 34.0 pg   MCHC 34.4 32.0 - 36.0 g/dL   RDW 15.0 (H) 11.5 - 14.5 %   Platelets 202 150 - 440 K/uL  Troponin I     Status: None   Collection Time: 05/30/16  4:41 PM  Result Value Ref Range   Troponin I <0.03 <0.03 ng/mL   ____________________________________________  EKG My review and personal interpretation at Time: 16:36   Indication: chest pain  Rate: 55  Rhythm: afib Other: no acute ischemic changes, otherwise normal intervals ____________________________________________  RADIOLOGY  I personally reviewed all radiographic images ordered to evaluate for the above acute complaints and reviewed radiology reports and findings.  These findings were personally discussed with the patient.  Please see medical record for radiology report.  ____________________________________________   PROCEDURES  Procedure(s) performed: none    Critical Care performed: no ____________________________________________   INITIAL IMPRESSION /  ASSESSMENT AND PLAN / ED COURSE  Pertinent labs & imaging results that were available during my care of the patient were reviewed by me and considered in my medical decision making (see chart for details).  DDX: ACS, pericarditis, esophagitis, boerhaaves, pe, dissection, pna, bronchitis, costochondritis   ADAYSIA FAIDLEY is a 82 y.o. who presents to the ED with episode of chest pain earlier today. Patient does have multiple risk factors for cardiac disease including hypertension, hyperlipidemia and diabetes. Had a stress test every year and a half ago. Has never had pain like this before. Not consistent with PE as she is on Zaroxolyn has no hypoxia or tachycardia at this time. He EKG shows A. fib with nonspecific ST changes no acute ischemia. Troponin is negative. Chest x-ray shows no acute infiltrate. Her abdominal exam is soft and benign. Based on these findings do feel patient will require observation for further risk stratification.  Have discussed with the patient and available family all diagnostics and treatments performed thus  far and all questions were answered to the best of my ability. The patient demonstrates understanding and agreement with plan.   Clinical Course     ____________________________________________   FINAL CLINICAL IMPRESSION(S) / ED DIAGNOSES  Final diagnoses:  Chest pain, unspecified type      NEW MEDICATIONS STARTED DURING THIS VISIT:  New Prescriptions   No medications on file     Note:  This document was prepared using Dragon voice recognition software and may include unintentional dictation errors.    Merlyn Lot, MD 05/30/16 2207

## 2016-05-30 NOTE — ED Notes (Addendum)
2 unsuccessful IV attempts in patient's left wrist. Let float nurse IV access is needed.

## 2016-05-30 NOTE — ED Notes (Signed)
Christine, RN attempted 1 IV stick unsuccessfully.

## 2016-05-30 NOTE — ED Notes (Signed)
ED Nurse called to find out if bed was ready yet. Nurse told it would be a little longer while they cleaned another room for her.

## 2016-05-30 NOTE — H&P (Signed)
[name]    MR#:  [id number]  DATE OF BIRTH:  05-Jul-1930  DATE OF ADMISSION:  05/30/2016  PRIMARY CARE PHYSICIAN: Adin Hector, MD   REQUESTING/REFERRING PHYSICIAN: Dr. Quentin Cornwall  CHIEF COMPLAINT:   Chief Complaint  Patient presents with  . Chest Pain    HISTORY OF PRESENT ILLNESS:  Shelley Cameron  is a 82 y.o. female with a known history of CVA, atrial fibrillation, hypertension, diabetes presents to the emergency room due to acute onset of central chest pain while watching television at home. This pain lasted all through her journey in a private car to the emergency room and resolve on its own. Vision is a poor historian due to dementia and history obtained from daughter at bedside, ER staff and reviewing old records. Daughter mentions that she noticed patient held the center of the chest and seemed uncomfortable. She had a normal stress test in February 2016 for similar pain. Patient had vaginal biopsy earlier today due to vaginal bleeding.  PAST MEDICAL HISTORY:   Past Medical History:  Diagnosis Date  . A-fib ([place])   . Cancer ([place])   . Diabetes mellitus without complication ([place])   . Gout   . Hypertension   . Moderate dementia   . Severe mitral insufficiency   . Stroke ([place])   . Systolic CHF ([place])     PAST SURGICAL HISTORY:   Past Surgical History:  Procedure Laterality Date  . BREAST SURGERY      SOCIAL HISTORY:   Social History  Substance Use Topics  . Smoking status: Never Smoker  . Smokeless tobacco: Never Used  . Alcohol use No    FAMILY HISTORY:   Family History  Problem Relation Age of Onset  . Hypertension Father     DRUG ALLERGIES:   Allergies  Allergen Reactions  . Codeine     REVIEW OF SYSTEMS:   Review of Systems  Constitutional: Negative for chills, fever, malaise/fatigue and weight loss.  HENT: Negative for hearing loss, nosebleeds and sore  throat.   Eyes: Negative for blurred vision, double vision and pain.  Respiratory: Positive for shortness of breath. Negative for cough, hemoptysis, sputum production and wheezing.   Cardiovascular: Positive for chest pain. Negative for palpitations, orthopnea and leg swelling.  Gastrointestinal: Negative for abdominal pain, constipation, diarrhea, heartburn, nausea and vomiting.  Genitourinary: Negative for dysuria and hematuria.  Musculoskeletal: Negative for back pain, falls, joint pain and myalgias.  Skin: Negative for rash.  Neurological: Negative for dizziness, tremors, sensory change, speech change, focal weakness, seizures, weakness and headaches.  Endo/Heme/Allergies: Does not bruise/bleed easily.  Psychiatric/Behavioral: Negative for depression and memory loss. The patient is not nervous/anxious.     MEDICATIONS AT HOME:   Prior to Admission medications   Medication Sig Start Date End Date Taking? Authorizing Provider  amLODipine (NORVASC) 10 MG tablet Take 10 mg by mouth daily.   Yes Historical Provider, MD  Cholecalciferol (VITAMIN D) 2000 units CAPS Take 1,000 Units by mouth daily.   Yes Historical Provider, MD  colchicine 0.6 MG tablet Take 0.6 mg by mouth daily.   Yes Historical Provider, MD  donepezil (ARICEPT) 5 MG tablet Take 5 mg by mouth at bedtime.   Yes Historical Provider, MD  glipiZIDE (GLUCOTROL) 5 MG tablet Take by mouth daily before breakfast.   Yes Historical Provider, MD  hydrochlorothiazide (HYDRODIURIL) 12.5 MG tablet Take 12.5 mg by mouth daily.  Yes Historical Provider, MD  lovastatin (MEVACOR) 40 MG tablet Take 40 mg by mouth at bedtime.   Yes Historical Provider, MD  metoprolol succinate (TOPROL-XL) 50 MG 24 hr tablet Take 25 mg by mouth daily. Take with or immediately following a meal.   Yes Historical Provider, MD  Rivaroxaban (XARELTO) 15 MG TABS tablet Take 15 mg by mouth 2 (two) times daily with a meal.   Yes Historical Provider, MD  vitamin B-12  (CYANOCOBALAMIN) 1000 MCG tablet Take 1,000 mcg by mouth daily.   Yes Historical Provider, MD     VITAL SIGNS:  Blood pressure 134/70, pulse (!) 53, temperature 98 F (36.7 C), temperature source Oral, resp. rate 20, height 5\' 2"  (1.575 m), weight 67.6 kg (149 lb), SpO2 96 %.  PHYSICAL EXAMINATION:  Physical Exam  GENERAL:  82 y.o.-year-old patient lying in the bed with no acute distress.  EYES: Pupils equal, round, reactive to light and accommodation. No scleral icterus. Extraocular muscles intact.  HEENT: Head atraumatic, normocephalic. Oropharynx and nasopharynx clear. No oropharyngeal erythema, moist oral mucosa  NECK:  Supple, no jugular venous distention. No thyroid enlargement, no tenderness.  LUNGS: Normal breath sounds bilaterally, no wheezing, rales, rhonchi. No use of accessory muscles of respiration.  CARDIOVASCULAR: S1, S2 , irregular. No murmurs, rubs, or gallops.  ABDOMEN: Soft, nontender, nondistended. Bowel sounds present. No organomegaly or mass.  EXTREMITIES: No pedal edema, cyanosis, or clubbing. + 2 pedal & radial pulses b/l.   NEUROLOGIC: Cranial nerves II through XII are intact. No focal Motor or sensory deficits appreciated b/l PSYCHIATRIC: The patient is alert and awake. Presently confused. SKIN: No obvious rash, lesion, or ulcer.   LABORATORY PANEL:   CBC  Recent Labs Lab 05/30/16 1641  WBC 9.7  HGB 13.8  HCT 40.0  PLT 202   ------------------------------------------------------------------------------------------------------------------  Chemistries   Recent Labs Lab 05/30/16 1641  NA 141  K 3.1*  CL 101  CO2 31  GLUCOSE 113*  BUN 24*  CREATININE 1.25*  CALCIUM 9.2   ------------------------------------------------------------------------------------------------------------------  Cardiac Enzymes  Recent Labs Lab 05/30/16 1641  TROPONINI <0.03    ------------------------------------------------------------------------------------------------------------------  RADIOLOGY:  Dg Chest 2 View  Result Date: 05/30/2016 CLINICAL DATA:  Chest pain.  History of previous breast carcinoma EXAM: CHEST  2 VIEW COMPARISON:  None. FINDINGS: There is no edema or consolidation. The heart size and pulmonary vascularity are normal. No adenopathy. No bone lesions. No pneumothorax. There are surgical clips in the right axillary region. IMPRESSION: No edema or consolidation. Electronically Signed   By: Lowella Grip III M.D.   On: 05/30/2016 17:17     IMPRESSION AND PLAN:   * Chest pain, atypical  She is a poor historian. Does have risk factors. Admit for overnight observation on telemetry floor. Repeat troponin. Had normal stress test in 09/2014. Discharge home if troponins normal and telemetry shows no arrhythmias to follow-up with cardiology as outpatient.  * Diabetes mellitus Home medications and sliding scale insulin  * Hypertension Continue medication  * Atrial fibrillation, rate controlled Continue rate control medications and Xarelto  All the records are reviewed and case discussed with ED provider. Management plans discussed with the patient, family and they are in agreement.  CODE STATUS: FULL CODE  TOTAL TIME TAKING CARE OF THIS PATIENT: 45 minutes.   Hillary Bow R M.D on 05/30/2016 at 8:19 PM  Between 7am to 6pm - Pager - [phone]  After 6pm go to [email or web address] - password [email or web address] [facility]  Hospitalists  Office  [phone]  CC: Primary care physician; Adin Hector, MD  Note: This dictation was prepared with Dragon dictation along with smaller phrase technology. Any transcriptional errors that result from this process are unintentional.

## 2016-05-31 ENCOUNTER — Encounter: Payer: Self-pay | Admitting: *Deleted

## 2016-05-31 DIAGNOSIS — R079 Chest pain, unspecified: Secondary | ICD-10-CM | POA: Diagnosis not present

## 2016-05-31 DIAGNOSIS — I4891 Unspecified atrial fibrillation: Secondary | ICD-10-CM | POA: Diagnosis not present

## 2016-05-31 DIAGNOSIS — E119 Type 2 diabetes mellitus without complications: Secondary | ICD-10-CM | POA: Diagnosis not present

## 2016-05-31 DIAGNOSIS — F0390 Unspecified dementia without behavioral disturbance: Secondary | ICD-10-CM | POA: Diagnosis not present

## 2016-05-31 DIAGNOSIS — I455 Other specified heart block: Secondary | ICD-10-CM | POA: Diagnosis not present

## 2016-05-31 LAB — GLUCOSE, CAPILLARY
GLUCOSE-CAPILLARY: 103 mg/dL — AB (ref 65–99)
Glucose-Capillary: 84 mg/dL (ref 65–99)
Glucose-Capillary: 61 mg/dL — ABNORMAL LOW (ref 65–99)
Glucose-Capillary: 117 mg/dL — ABNORMAL HIGH (ref 65–99)
Glucose-Capillary: 101 mg/dL — ABNORMAL HIGH (ref 65–99)

## 2016-05-31 LAB — TROPONIN I: Troponin I: <0.03 ng/mL (ref <0.03)

## 2016-05-31 MED ORDER — RIVAROXABAN 15 MG PO TABS: 15.0000 mg | ORAL_TABLET | Freq: Every day | ORAL | Status: DC

## 2016-05-31 NOTE — Consult Note (Signed)
[facility]  CARDIOLOGY CONSULT NOTE  Patient ID: DEMIRA DIAB MRN: [id number] DOB/AGE: 1930/08/02 82 y.o.  Admit date: 05/30/2016 Referring Physician Dr. Vianne Bulls Primary Physician   Primary Cardiologist Dr. Nehemiah Massed Reason for Consultation chest pain  HPI: pt with chronic afib and history of cva and dementia. Admitted with atypical chest pain and ruled out for an mi. Pain occurred after eating and was in both sides of her chest. Currrently pain free.   Review of Systems  Constitutional: Negative.   HENT: Negative.   Eyes: Negative.   Respiratory: Negative.   Cardiovascular: Positive for chest pain.  Gastrointestinal: Negative.   Genitourinary: Negative.   Musculoskeletal: Negative.   Skin: Negative.   Neurological: Negative.   Endo/Heme/Allergies: Negative.   Psychiatric/Behavioral: Negative.     Past Medical History:  Diagnosis Date  . A-fib ([place])   . Cancer ([place])   . Diabetes mellitus without complication ([place])   . Gout   . Hypertension   . Moderate dementia   . Severe mitral insufficiency   . Stroke ([place])   . Systolic CHF ([place])     Family History  Problem Relation Age of Onset  . Hypertension Father     Social History   Social History  . Marital status: Single    Spouse name: N/A  . Number of children: N/A  . Years of education: N/A   Occupational History  . Not on file.   Social History Main Topics  . Smoking status: Never Smoker  . Smokeless tobacco: Never Used  . Alcohol use No  . Drug use: Unknown  . Sexual activity: Not on file   Other Topics Concern  . Not on file   Social History Narrative  . No narrative on file    Past Surgical History:  Procedure Laterality Date  . BREAST SURGERY       Prescriptions Prior to Admission  Medication Sig Dispense Refill Last Dose  . amLODipine (NORVASC) 10 MG tablet Take 10 mg by mouth daily.   05/29/2016 at Unknown time  . Cholecalciferol (VITAMIN D) 2000  units CAPS Take 1,000 Units by mouth daily.   05/29/2016 at Unknown time  . colchicine 0.6 MG tablet Take 0.6 mg by mouth daily.   prn at prn  . donepezil (ARICEPT) 5 MG tablet Take 5 mg by mouth at bedtime.   05/29/2016 at Unknown time  . glipiZIDE (GLUCOTROL) 5 MG tablet Take by mouth daily before breakfast.   05/29/2016 at Unknown time  . hydrochlorothiazide (HYDRODIURIL) 12.5 MG tablet Take 12.5 mg by mouth daily.   05/29/2016 at Unknown time  . lovastatin (MEVACOR) 40 MG tablet Take 40 mg by mouth at bedtime.   05/29/2016 at Unknown time  . metoprolol succinate (TOPROL-XL) 50 MG 24 hr tablet Take 25 mg by mouth daily. Take with or immediately following a meal.   05/29/2016 at Unknown time  . Rivaroxaban (XARELTO) 15 MG TABS tablet Take 15 mg by mouth daily with supper.    05/29/2016 at 2000  . vitamin B-12 (CYANOCOBALAMIN) 1000 MCG tablet Take 1,000 mcg by mouth daily.   05/29/2016 at Unknown time    Physical Exam: Blood pressure (!) 108/48, pulse (!) 58, temperature 97.5 F (36.4 C), temperature source Oral, resp. rate 18, height 5\' 2"  (1.575 m), weight 67.6 kg (149 lb), SpO2 97 %.   Wt Readings from Last 1 Encounters:  05/30/16 67.6 kg (149 lb)     General appearance:  cooperative Resp: clear to auscultation bilaterally Cardio: irregularly irregular rhythm GI: soft, non-tender; bowel sounds normal; no masses,  no organomegaly Neurologic: Grossly normal  Labs:   Lab Results  Component Value Date   WBC 9.7 05/30/2016   HGB 13.8 05/30/2016   HCT 40.0 05/30/2016   MCV 83.4 05/30/2016   PLT 202 05/30/2016    Recent Labs Lab 05/30/16 1641  NA 141  K 3.1*  CL 101  CO2 31  BUN 24*  CREATININE 1.25*  CALCIUM 9.2  GLUCOSE 113*   Lab Results  Component Value Date   TROPONINI <0.03 05/31/2016      Radiology: no acute cardiopulmonary abnormalities.  EKG: afib with controlled vr. No prolonged pauses greater than 3 sec.   ASSESSMENT AND PLAN: Pt is an 82 yo female with history  of cva, chronic afib, hypertension and dm who was asdmitted after developiing bilateral chest pain which occurred after eating lunch. She had a vaginal biopsy earlier in the day. She went shopping after the procedure and then to her daughters house where she ate fried chicken and was watching tv when the pain occurred. Not relieved or worsened with ambulation. EKG showed afib with no iscfhemia. She ruled out for an mi and has no further chest pain.She is anticoagulated with xarelto at 15 mg daily and has fairly good rate control. Telemetry showed controlled vr with no prolonged pauses. Longest pause was 2.2 sec. Asymptomatic. Would recommend discharge on current meds and will proceed with outpatient funciton study in Dr. Alveria Apley office.  Signed: Teodoro Spray MD, [facility] 05/31/2016, 5:43 PM

## 2016-05-31 NOTE — Care Management Obs Status (Signed)
[facility] NOTIFICATION   Patient Details  Name: Shelley Cameron MRN: [id number] Date of Birth: [birth year]   Medicare Observation Status Notification Given:  Yes    Katrina Stack, RN 05/31/2016, 10:13 AM

## 2016-05-31 NOTE — Progress Notes (Signed)
Patient FS at 1615 was 77, patient remains asymptomatic, hypoglycemia protocol initaited. FS recheck was 117. Will continues to monitor.

## 2016-05-31 NOTE — Progress Notes (Signed)
Shelley Cameron, Shelley Cameron 82 y/o female. Patient was transferred from the ER to 2A following c/o chest pain. On admission patient was able to ambulate to her room with standby assist, she was A&O X4 and accompanied by her daughter. Patient and daughter were oriented to the room, educated about fall prevention and scheduled medications. Patient remained in asymptomatic afib, afebrile and hemodynamically stable. Patient's daughter was at her bedside overnight.

## 2016-05-31 NOTE — Progress Notes (Signed)
[facility] [place] NAME: Shelley Cameron    MR#:  [id number]  DATE OF BIRTH:  03-05-1930  SUBJECTIVE: 82 year old female patient admitted for chest pain. Troponins are negative for 3 times. Noted to have 2.25 second pause on telemetry Patient denies any chest pain now. .   CHIEF COMPLAINT:   Chief Complaint  Patient presents with  . Chest Pain    REVIEW OF SYSTEMS:    Review of Systems  Constitutional: Negative for chills and fever.  HENT: Negative for hearing loss.   Eyes: Negative for blurred vision, double vision and photophobia.  Respiratory: Negative for cough, hemoptysis and shortness of breath.   Cardiovascular: Negative for palpitations, orthopnea and leg swelling.  Gastrointestinal: Negative for abdominal pain, diarrhea and vomiting.  Genitourinary: Negative for dysuria and urgency.  Musculoskeletal: Negative for myalgias and neck pain.  Skin: Negative for rash.  Neurological: Negative for dizziness, focal weakness, seizures, weakness and headaches.  Psychiatric/Behavioral: Negative for memory loss. The patient does not have insomnia.     Nutrition: Tolerating Diet: Tolerating PT:      DRUG ALLERGIES:   Allergies  Allergen Reactions  . Codeine     VITALS:  Blood pressure (!) 108/48, pulse (!) 58, temperature 97.5 F (36.4 C), temperature source Oral, resp. rate 18, height 5\' 2"  (1.575 m), weight 67.6 kg (149 lb), SpO2 97 %.  PHYSICAL EXAMINATION:   Physical Exam  GENERAL:  82 y.o.-year-old patient lying in the bed with no acute distress.  EYES: Pupils equal, round, reactive to light and accommodation. No scleral icterus. Extraocular muscles intact.  HEENT: Head atraumatic, normocephalic. Oropharynx and nasopharynx clear.  NECK:  Supple, no jugular venous distention. No thyroid enlargement, no tenderness.  LUNGS: Normal breath sounds bilaterally, no wheezing, rales,rhonchi or crepitation. No use of  accessory muscles of respiration.  CARDIOVASCULAR: S1, S2 normal. No murmurs, rubs, or gallops.  ABDOMEN: Soft, nontender, nondistended. Bowel sounds present. No organomegaly or mass.  EXTREMITIES: No pedal edema, cyanosis, or clubbing.  NEUROLOGIC: Cranial nerves II through XII are intact. Muscle strength 5/5 in all extremities. Sensation intact. Gait not checked.  PSYCHIATRIC: The patient is alert and oriented x 3.  SKIN: No obvious rash, lesion, or ulcer.    LABORATORY PANEL:   CBC  Recent Labs Lab 05/30/16 1641  WBC 9.7  HGB 13.8  HCT 40.0  PLT 202   ------------------------------------------------------------------------------------------------------------------  Chemistries   Recent Labs Lab 05/30/16 1641  NA 141  K 3.1*  CL 101  CO2 31  GLUCOSE 113*  BUN 24*  CREATININE 1.25*  CALCIUM 9.2   ------------------------------------------------------------------------------------------------------------------  Cardiac Enzymes  Recent Labs Lab 05/31/16 0308  TROPONINI <0.03   ------------------------------------------------------------------------------------------------------------------  RADIOLOGY:  Dg Chest 2 View  Result Date: 05/30/2016 CLINICAL DATA:  Chest pain.  History of previous breast carcinoma EXAM: CHEST  2 VIEW COMPARISON:  None. FINDINGS: There is no edema or consolidation. The heart size and pulmonary vascularity are normal. No adenopathy. No bone lesions. No pneumothorax. There are surgical clips in the right axillary region. IMPRESSION: No edema or consolidation. Electronically Signed   By: Lowella Grip III M.D.   On: 05/30/2016 17:17     ASSESSMENT AND PLAN:   Active Problems:   Chest pain  #1 atypical chest pain likely secondary to GERD: Troponins are negative for 3 times.cardio consult requested because of pause #2 dementia continue Aricept #3 diabetes mellitus type 2;contrinue glucotrol.   Chronic atrial fibrillation:'  Patient noted to have some pauses on telemetry. Hold the metoprolol. Continue Xarelto.15 mg po daily  Based on renal  Function.same explained to daughter.       All the records are reviewed and case discussed with Care Management/Social Workerr. Management plans discussed with the patient, family and they are in agreement.  CODE STATUS:full  TOTAL TIME TAKING CARE OF THIS PATIENT: 35 minutes.   POSSIBLE D/C IN 1-2 DAYS, DEPENDING ON CLINICAL CONDITION.   Epifanio Lesches M.D on 05/31/2016 at 11:45 AM  Between 7am to 6pm - Pager - [phone]  After 6pm go to [email or web address] - password [email or web address] [facility]  [facility] Hospital[facility]  Office  [phone]  CC: Primary care physician; Adin Hector, MD

## 2016-05-31 NOTE — Care Management (Signed)
Placed in observation for chest pain.  Troponins negative.  Did have 2.25 second pause on telemetry.Lives with daughter. Independent in all adls, denies issues accessing medical care, obtaining medications or with transportation.  Current with  PCP.  No discharge needs identified at present by care manager or members of care team

## 2016-05-31 NOTE — Progress Notes (Signed)
Received a call form CCMD regarding patient having a pause of 2.25sec. DR Vianne Bulls made aware during round.

## 2016-06-01 DIAGNOSIS — R079 Chest pain, unspecified: Secondary | ICD-10-CM | POA: Diagnosis not present

## 2016-06-01 DIAGNOSIS — F0390 Unspecified dementia without behavioral disturbance: Secondary | ICD-10-CM | POA: Diagnosis not present

## 2016-06-01 DIAGNOSIS — E119 Type 2 diabetes mellitus without complications: Secondary | ICD-10-CM | POA: Diagnosis not present

## 2016-06-01 DIAGNOSIS — I455 Other specified heart block: Secondary | ICD-10-CM | POA: Diagnosis not present

## 2016-06-01 LAB — HEMOGLOBIN A1C
Hgb A1c MFr Bld: 5.9 % — ABNORMAL HIGH (ref 4.8–5.6)
Mean Plasma Glucose: 123 mg/dL

## 2016-06-01 LAB — GLUCOSE, CAPILLARY
GLUCOSE-CAPILLARY: 119 mg/dL — AB (ref 65–99)
Glucose-Capillary: 86 mg/dL (ref 65–99)

## 2016-06-01 MED ORDER — ALBUTEROL SULFATE (2.5 MG/3ML) 0.083% IN NEBU: 2.5000 mg | INHALATION_SOLUTION | RESPIRATORY_TRACT | 12 refills | Status: DC | PRN

## 2016-06-01 NOTE — Discharge Summary (Addendum)
Shelley Cameron, is a 82 y.o. female  DOB [birth year]  MRN [id number].  Admission date:  05/30/2016  Admitting Physician  Hillary Bow, MD  Discharge Date:  06/01/2016   Primary MD  [place] III, MD  Recommendations for primary care physician for things to follow:   Follow-up with Dr. Nehemiah Massed in 1 week Follow up with primary doctor in 1 week   Admission Diagnosis  chest pain   Discharge Diagnosis  chest pain    Active Problems:   Chest pain      Past Medical History:  Diagnosis Date  . A-fib ([place])   . Cancer ([place])   . Diabetes mellitus without complication ([place])   . Gout   . Hypertension   . Moderate dementia   . Severe mitral insufficiency   . Stroke ([place])   . Systolic CHF [facility])     Past Surgical History:  Procedure Laterality Date  . BREAST SURGERY         History of present illness and  Hospital Course:     Kindly see H&P for history of present illness and admission details, please review complete Labs, Consult reports and Test reports for all details in brief  HPI  from the history and physical done on the day of admission 82 year old female patient with history of dementia, diabetes mellitus type 2, hypertension admitted because of chest pain.   Hospital Course  #1 chest pain with negative troponins likely secondary to  Chest wall pain;. Seen by cardiology because of 2.2 second possible noted on telemetry. Dr.Fath  saw the patient, she was asymptomatic so he did not recommend any further workup. Can follow up with cardiology as an outpatient for functional study. #2 chronic atrial fibrillation rate controlled. Patient is on Xarelto 15 mg daily,  continue metoprolol succinate 50 MG daily. #3/diabetes mellitus type 2: Because her hemoglobin A1c is 5.9, poor  renal function, her age, low blood  sugar last evening diabetes coordinator recommended discontinuing the Glucotrol. Blood sugar was 61 yesterday evening. 4. Essential hypertension: Continue amlodipine. Dementia: Continue Aricept 5 mg at bedtime.    Discharge Condition: stable   Follow UP  Follow-up Information    BERT Briscoe Burns III, MD Follow up in 1 week(s).   Specialty:  Internal Medicine Why:  Monday, October 16th at [place], [employer] information: [address] [facility] [place] [address] [place] J, MD Follow up in 1 week(s).   Specialty:  Cardiology Why:  Thursday, [place] 19th at [place], [facility] Contact information: [place] [facility] [place] [address] [phone]             Discharge Instructions  and  Discharge Medications       Medication List    STOP taking these medications   glipiZIDE 5 MG tablet Commonly known as:  GLUCOTROL     TAKE these medications   amLODipine 10 MG tablet Commonly known as:  NORVASC Take 10 mg by mouth daily.   colchicine 0.6 MG tablet Take 0.6 mg by mouth daily.   donepezil 5 MG tablet Commonly known as:  ARICEPT Take 5 mg by mouth at bedtime.   hydrochlorothiazide 12.5 MG tablet Commonly known as:  HYDRODIURIL Take 12.5 mg by mouth daily.   lovastatin 40 MG tablet Commonly known as:  MEVACOR Take 40 mg by mouth at bedtime.   metoprolol succinate 50 MG 24 hr  tablet Commonly known as:  TOPROL-XL Take 25 mg by mouth daily. Take with or immediately following a meal.   Rivaroxaban 15 MG Tabs tablet Commonly known as:  XARELTO Take 15 mg by mouth daily with supper.   vitamin B-12 1000 MCG tablet Commonly known as:  CYANOCOBALAMIN Take 1,000 mcg by mouth daily.   Vitamin D 2000 units Caps Take 1,000 Units by mouth daily.         Diet and Activity recommendation: See Discharge Instructions above   Consults obtained -cardiology   Major procedures and  Radiology Reports - PLEASE review detailed and final reports for all details, in brief -      Dg Chest 2 View  Result Date: 05/30/2016 CLINICAL DATA:  Chest pain.  History of previous breast carcinoma EXAM: CHEST  2 VIEW COMPARISON:  None. FINDINGS: There is no edema or consolidation. The heart size and pulmonary vascularity are normal. No adenopathy. No bone lesions. No pneumothorax. There are surgical clips in the right axillary region. IMPRESSION: No edema or consolidation. Electronically Signed   By: Lowella Grip III M.D.   On: 05/30/2016 17:17    Micro Results     No results found for this or any previous visit (from the past 240 hour(s)).     Today   Subjective:   Shelley Cameron today has no headache,no chest abdominal pain,no new weakness tingling or numbness, feels much better wants to go home today.   Objective:   Blood pressure (!) 110/53, pulse 75, temperature 97.5 F (36.4 C), temperature source Oral, resp. rate 19, height 5\' 2"  (1.575 m), weight 67 kg (147 lb 9.6 oz), SpO2 100 %.   Intake/Output Summary (Last 24 hours) at 06/01/16 1332 Last data filed at 06/01/16 0952  Gross per 24 hour  Intake              723 ml  Output                0 ml  Net              723 ml    Exam Awake Alert, Oriented x 3, No new F.N deficits, Normal affect [place].AT,PERRAL Supple Neck,No JVD, No cervical lymphadenopathy appriciated.  Symmetrical Chest wall movement, Good air movement bilaterally, CTAB RRR,No Gallops,Rubs or new Murmurs, No Parasternal Heave +ve B.Sounds, Abd Soft, Non tender, No organomegaly appriciated, No rebound -guarding or rigidity. No Cyanosis, Clubbing or edema, No new Rash or bruise  Data Review   CBC w Diff:  Lab Results  Component Value Date   WBC 9.7 05/30/2016   HGB 13.8 05/30/2016   HCT 40.0 05/30/2016   PLT 202 05/30/2016    CMP:  Lab Results  Component Value Date   NA 141 05/30/2016   K 3.1 (L) 05/30/2016   CL 101 05/30/2016    CO2 31 05/30/2016   BUN 24 (H) 05/30/2016   CREATININE 1.25 (H) 05/30/2016  .   Total Time in preparing paper work, data evaluation and todays exam - 13 minutes  Norissa Bartee M.D on 06/01/2016 at 1:32 PM    Note: This dictation was prepared with Dragon dictation along with smaller phrase technology. Any transcriptional errors that result from this process are unintentional.

## 2016-06-01 NOTE — Progress Notes (Signed)
Inpatient Diabetes Program Recommendations  AACE/ADA: New Consensus Statement on Inpatient Glycemic Control (2015)  Target Ranges:  Prepandial:   less than 140 mg/dL      Peak postprandial:   less than 180 mg/dL (1-2 hours)      Critically ill patients:  140 - 180 mg/dL   Lab Results  Component Value Date   GLUCAP 86 06/01/2016   HGBA1C 5.9 (H) 05/30/2016    Review of Glycemic Control  Results for JESSICAH, FABIAN (MRN [id number]) as of 06/01/2016 09:43  Ref. Range 05/31/2016 11:39 05/31/2016 16:15 05/31/2016 18:12 05/31/2016 21:21 06/01/2016 07:36  Glucose-Capillary Latest Ref Range: 65 - 99 mg/dL 103 (H) 61 (L) 117 (H) 101 (H) 86    Diabetes history: Type 2 Outpatient Diabetes medications: Glucotrol 5mg /day Current orders for Inpatient glycemic control:Glucotrol 5mg /day, Novolog 0-9 units tid, Novolog 0-5 units qhs  Inpatient Diabetes Program Recommendations:  Because of her A1C 5.9%, poor renal function, her age and a low blood sugar last evening, please d/c glucotrol.  Do not recommend re-ordering Glucotrol at discharge.  Gentry Fitz, RN, BA, MHA, CDE Diabetes Coordinator Inpatient Diabetes Program  [phone] (Team Pager) [phone] ([place]) 06/01/2016 9:45 AM

## 2016-06-01 NOTE — Progress Notes (Signed)
Patient discharge home with daughter, discharge instructions and follow ups given and explained to patient and daughter at bedside, verbalized understanding. Patient was wheeled out of the building, no acute distress noted.

## 2016-06-11 DIAGNOSIS — R079 Chest pain, unspecified: Secondary | ICD-10-CM | POA: Diagnosis not present

## 2016-06-11 DIAGNOSIS — I5020 Unspecified systolic (congestive) heart failure: Secondary | ICD-10-CM | POA: Diagnosis not present

## 2016-06-11 DIAGNOSIS — E119 Type 2 diabetes mellitus without complications: Secondary | ICD-10-CM | POA: Diagnosis not present

## 2016-06-12 ENCOUNTER — Other Ambulatory Visit: Payer: Self-pay | Admitting: Internal Medicine

## 2016-06-12 DIAGNOSIS — R079 Chest pain, unspecified: Secondary | ICD-10-CM

## 2016-06-13 ENCOUNTER — Ambulatory Visit (INDEPENDENT_AMBULATORY_CARE_PROVIDER_SITE_OTHER): Payer: Commercial Managed Care - HMO | Admitting: General Surgery

## 2016-06-13 ENCOUNTER — Encounter: Payer: Self-pay | Admitting: General Surgery

## 2016-06-13 VITALS — BP 110/76 | HR 80 | Resp 12 | Ht 63.0 in | Wt 146.0 lb

## 2016-06-13 DIAGNOSIS — R0789 Other chest pain: Secondary | ICD-10-CM | POA: Diagnosis not present

## 2016-06-13 NOTE — Patient Instructions (Signed)
The patient is aware to call back for any questions or concerns.  

## 2016-06-13 NOTE — Progress Notes (Signed)
Patient ID: Shelley Cameron, female   DOB: Dec 28, 1929, 82 y.o.   MRN: [id number]  Chief Complaint  Patient presents with  . Other    HPI Shelley Cameron is a 82 y.o. female here today for some changes noticed at her right mastectomy site which was done in 1998. She states it cause some pain when she lays on that side for about 2-3 weeks. She =does a lymphedema sleeve she is suppose to wear on the right arm but does not wear often. She was recently in the hospital for chest pain evaluaiton. CT scan scheduled for next week. She is here today with her daughter, Shelley Cameron.  HPI  Past Medical History:  Diagnosis Date  . A-fib ([place])   . Arthritis   . Cancer ([place])   . Diabetes mellitus without complication ([place])   . Gout   . Hypertension   . Moderate dementia   . Severe mitral insufficiency   . Stroke [facility]) 2011  . Systolic CHF ([place])   . Urinary incontinence     Past Surgical History:  Procedure Laterality Date  . BREAST SURGERY Left 08/1997   Dr Smith/mastectomy  . CATARACT EXTRACTION  2000  . MASTECTOMY Right 02/19/1997   Dr Bary Castilla    Family History  Problem Relation Age of Onset  . Hypertension Father     Social History Social History  Substance Use Topics  . Smoking status: Never Smoker  . Smokeless tobacco: Never Used  . Alcohol use No    Allergies  Allergen Reactions  . Codeine Nausea Only    Current Outpatient Prescriptions  Medication Sig Dispense Refill  . amLODipine (NORVASC) 5 MG tablet Take by mouth.    . Cholecalciferol (VITAMIN D) 2000 units CAPS Take 1,000 Units by mouth daily.    . colchicine 0.6 MG tablet Take 0.6 mg by mouth as needed.     . donepezil (ARICEPT) 5 MG tablet Take 5 mg by mouth at bedtime.    . hydrochlorothiazide (HYDRODIURIL) 12.5 MG tablet Take 12.5 mg by mouth daily.    Marland Kitchen lovastatin (MEVACOR) 40 MG tablet Take 40 mg by mouth at bedtime.    . metoprolol succinate (TOPROL-XL) 50 MG 24 hr tablet Take 25 mg by mouth daily.  Take with or immediately following a meal.    . Rivaroxaban (XARELTO) 15 MG TABS tablet Take 15 mg by mouth daily with supper.     . vitamin B-12 (CYANOCOBALAMIN) 1000 MCG tablet Take 1,000 mcg by mouth daily.     No current facility-administered medications for this visit.     Review of Systems Review of Systems  Constitutional: Negative.   Respiratory: Positive for chest tightness (appointment with Dr Nehemiah Massed pending).   Cardiovascular: Negative.     Blood pressure 110/76, pulse 80, resp. rate 12, height 5\' 3"  (1.6 m), weight 146 lb (66.2 kg).  Physical Exam Physical Exam  Constitutional: She is oriented to person, place, and time. She appears well-developed and well-nourished.  HENT:  Mouth/Throat: Oropharynx is clear and moist.  Eyes: Conjunctivae are normal. No scleral icterus.  Neck: Neck supple.  Cardiovascular: Normal rate and normal heart sounds.  An irregular rhythm present.  Pulmonary/Chest: Effort normal and breath sounds normal.    Bilateral mastectomy incisions   Lymphadenopathy:    She has no cervical adenopathy.    She has no axillary adenopathy.  Neurological: She is alert and oriented to person, place, and time.  Skin: Skin is warm and dry.  Psychiatric: Her behavior is normal.    Data Reviewed No imaging studies to review.  Assessment    Likely musculoskeletal pain with benign clinical exam.  Highly unlikely for late metastatic disease.    Plan    The patient is having a CT angioma in the near future. Certainly if any bony lesion was evident this could be detected on that imaging study. At this time reassurance was provided.    Follow up as needed. The patient is aware to call back for any questions or concerns.   This information has been scribed by Karie Fetch RN, BSN,BC.   Robert Bellow 06/15/2016, 6:58 AM

## 2016-06-14 DIAGNOSIS — I482 Chronic atrial fibrillation: Secondary | ICD-10-CM | POA: Diagnosis not present

## 2016-06-14 DIAGNOSIS — E782 Mixed hyperlipidemia: Secondary | ICD-10-CM | POA: Diagnosis not present

## 2016-06-14 DIAGNOSIS — R0789 Other chest pain: Secondary | ICD-10-CM | POA: Diagnosis not present

## 2016-06-14 DIAGNOSIS — I10 Essential (primary) hypertension: Secondary | ICD-10-CM | POA: Diagnosis not present

## 2016-06-19 ENCOUNTER — Ambulatory Visit: Admission: RE | Admit: 2016-06-19 | Payer: Commercial Managed Care - HMO | Source: Ambulatory Visit

## 2016-06-20 DIAGNOSIS — D2339 Other benign neoplasm of skin of other parts of face: Secondary | ICD-10-CM | POA: Diagnosis not present

## 2016-06-20 DIAGNOSIS — L720 Epidermal cyst: Secondary | ICD-10-CM | POA: Diagnosis not present

## 2016-06-20 DIAGNOSIS — D225 Melanocytic nevi of trunk: Secondary | ICD-10-CM | POA: Diagnosis not present

## 2016-06-21 ENCOUNTER — Ambulatory Visit
Admission: RE | Admit: 2016-06-21 | Discharge: 2016-06-21 | Disposition: A | Discharge status: Home / Self Care | Payer: Commercial Managed Care - HMO | Source: Ambulatory Visit | Attending: Internal Medicine | Admitting: Internal Medicine

## 2016-06-21 DIAGNOSIS — R0602 Shortness of breath: Secondary | ICD-10-CM | POA: Diagnosis not present

## 2016-06-21 DIAGNOSIS — R079 Chest pain, unspecified: Secondary | ICD-10-CM | POA: Diagnosis not present

## 2016-06-21 MED ORDER — IOPAMIDOL (ISOVUE-370) INJECTION 76%: 60.0000 mL | Freq: Once | INTRAVENOUS | Status: DC | PRN

## 2016-06-21 MED ORDER — IOPAMIDOL (ISOVUE-370) INJECTION 76%
75.0000 mL | Freq: Once | INTRAVENOUS | Status: AC | PRN
  Administered 2016-06-21: 75 mL via INTRAVENOUS

## 2016-06-25 DIAGNOSIS — I482 Chronic atrial fibrillation: Secondary | ICD-10-CM | POA: Diagnosis not present

## 2016-07-04 ENCOUNTER — Encounter: Payer: Self-pay | Admitting: *Deleted

## 2016-07-04 ENCOUNTER — Emergency Department
Admission: EM | Admit: 2016-07-04 | Discharge: 2016-07-05 | Disposition: A | Discharge status: Home / Self Care | Payer: Commercial Managed Care - HMO | Attending: Student in an Organized Health Care Education/Training Program | Admitting: Student in an Organized Health Care Education/Training Program

## 2016-07-04 ENCOUNTER — Emergency Department: Payer: Commercial Managed Care - HMO

## 2016-07-04 DIAGNOSIS — I10 Essential (primary) hypertension: Secondary | ICD-10-CM | POA: Diagnosis not present

## 2016-07-04 DIAGNOSIS — G40209 Localization-related (focal) (partial) symptomatic epilepsy and epileptic syndromes with complex partial seizures, not intractable, without status epilepticus: Secondary | ICD-10-CM | POA: Diagnosis not present

## 2016-07-04 DIAGNOSIS — M79672 Pain in left foot: Secondary | ICD-10-CM | POA: Diagnosis not present

## 2016-07-04 DIAGNOSIS — H1031 Unspecified acute conjunctivitis, right eye: Secondary | ICD-10-CM | POA: Diagnosis not present

## 2016-07-04 DIAGNOSIS — R918 Other nonspecific abnormal finding of lung field: Secondary | ICD-10-CM | POA: Diagnosis not present

## 2016-07-04 DIAGNOSIS — I4891 Unspecified atrial fibrillation: Secondary | ICD-10-CM | POA: Diagnosis not present

## 2016-07-04 DIAGNOSIS — R059 Cough, unspecified: Secondary | ICD-10-CM

## 2016-07-04 DIAGNOSIS — R531 Weakness: Secondary | ICD-10-CM | POA: Diagnosis not present

## 2016-07-04 DIAGNOSIS — R42 Dizziness and giddiness: Secondary | ICD-10-CM | POA: Diagnosis not present

## 2016-07-04 DIAGNOSIS — Z853 Personal history of malignant neoplasm of breast: Secondary | ICD-10-CM | POA: Diagnosis not present

## 2016-07-04 DIAGNOSIS — R05 Cough: Secondary | ICD-10-CM | POA: Diagnosis not present

## 2016-07-04 DIAGNOSIS — I110 Hypertensive heart disease with heart failure: Secondary | ICD-10-CM | POA: Insufficient documentation

## 2016-07-04 DIAGNOSIS — H1089 Other conjunctivitis: Secondary | ICD-10-CM | POA: Diagnosis not present

## 2016-07-04 DIAGNOSIS — E119 Type 2 diabetes mellitus without complications: Secondary | ICD-10-CM | POA: Insufficient documentation

## 2016-07-04 DIAGNOSIS — G301 Alzheimer's disease with late onset: Secondary | ICD-10-CM | POA: Diagnosis not present

## 2016-07-04 DIAGNOSIS — Z79899 Other long term (current) drug therapy: Secondary | ICD-10-CM | POA: Diagnosis not present

## 2016-07-04 DIAGNOSIS — I5020 Unspecified systolic (congestive) heart failure: Secondary | ICD-10-CM | POA: Diagnosis not present

## 2016-07-04 DIAGNOSIS — Z8673 Personal history of transient ischemic attack (TIA), and cerebral infarction without residual deficits: Secondary | ICD-10-CM | POA: Diagnosis not present

## 2016-07-04 DIAGNOSIS — R569 Unspecified convulsions: Secondary | ICD-10-CM | POA: Diagnosis not present

## 2016-07-04 DIAGNOSIS — F0280 Dementia in other diseases classified elsewhere without behavioral disturbance: Secondary | ICD-10-CM | POA: Diagnosis not present

## 2016-07-04 DIAGNOSIS — R55 Syncope and collapse: Secondary | ICD-10-CM | POA: Diagnosis not present

## 2016-07-04 LAB — URINALYSIS COMPLETE WITH MICROSCOPIC (ARMC ONLY)
BILIRUBIN URINE: NEGATIVE
Glucose, UA: NEGATIVE mg/dL
Ketones, ur: NEGATIVE mg/dL
LEUKOCYTES UA: NEGATIVE
Nitrite: NEGATIVE
PH: 6 (ref 5.0–8.0)
PROTEIN: 100 mg/dL — AB
Specific Gravity, Urine: 1.009 (ref 1.005–1.030)

## 2016-07-04 LAB — CBC
HEMATOCRIT: 40.8 % (ref 35.0–47.0)
Hemoglobin: 13.9 g/dL (ref 12.0–16.0)
MCH: 28.2 pg (ref 26.0–34.0)
MCHC: 34 g/dL (ref 32.0–36.0)
MCV: 83 fL (ref 80.0–100.0)
Platelets: 232 10*3/uL (ref 150–440)
RBC: 4.91 MIL/uL (ref 3.80–5.20)
RDW: 14.4 % (ref 11.5–14.5)
WBC: 10.2 10*3/uL (ref 3.6–11.0)

## 2016-07-04 LAB — COMPREHENSIVE METABOLIC PANEL
ALT: 19 U/L (ref 14–54)
AST: 34 U/L (ref 15–41)
Albumin: 3.5 g/dL (ref 3.5–5.0)
Alkaline Phosphatase: 81 U/L (ref 38–126)
Anion gap: 10 (ref 5–15)
BUN: 18 mg/dL (ref 6–20)
CO2: 29 mmol/L (ref 22–32)
Calcium: 8.5 mg/dL — ABNORMAL LOW (ref 8.9–10.3)
Chloride: 96 mmol/L — ABNORMAL LOW (ref 101–111)
Creatinine, Ser: 1.13 mg/dL — ABNORMAL HIGH (ref 0.44–1.00)
GFR calc Af Amer: 50 mL/min — ABNORMAL LOW (ref >60)
GFR calc non Af Amer: 43 mL/min — ABNORMAL LOW (ref >60)
Glucose, Bld: 134 mg/dL — ABNORMAL HIGH (ref 65–99)
Potassium: 3.4 mmol/L — ABNORMAL LOW (ref 3.5–5.1)
Sodium: 135 mmol/L (ref 135–145)
Total Bilirubin: 1.4 mg/dL — ABNORMAL HIGH (ref 0.3–1.2)
Total Protein: 7.5 g/dL (ref 6.5–8.1)

## 2016-07-04 MED ORDER — LEVETIRACETAM 250 MG PO TABS
250.0000 mg | ORAL_TABLET | Freq: Once | ORAL | Status: AC
  Administered 2016-07-04: 250 mg via ORAL
  Filled 2016-07-04: qty 1

## 2016-07-04 MED ORDER — SODIUM CHLORIDE 0.9 % IV BOLUS (SEPSIS): 500.0000 mL | Freq: Once | INTRAVENOUS | Status: DC

## 2016-07-04 MED ORDER — BACITRACIN-POLYMYXIN B 500-10000 UNIT/GM OP OINT: TOPICAL_OINTMENT | Freq: Two times a day (BID) | OPHTHALMIC | 0 refills | Status: AC

## 2016-07-04 MED ORDER — LEVETIRACETAM 500 MG PO TABS
ORAL_TABLET | ORAL | Status: AC
  Filled 2016-07-04: qty 1

## 2016-07-04 MED ORDER — BENZONATATE 100 MG PO CAPS: 100.0000 mg | ORAL_CAPSULE | Freq: Two times a day (BID) | ORAL | 0 refills | Status: AC | PRN

## 2016-07-04 MED ORDER — CEPHALEXIN 500 MG PO CAPS
500.0000 mg | ORAL_CAPSULE | Freq: Once | ORAL | Status: AC
  Administered 2016-07-04: 500 mg via ORAL
  Filled 2016-07-04: qty 1

## 2016-07-04 MED ORDER — GUAIFENESIN 100 MG/5ML PO SOLN
5.0000 mL | Freq: Once | ORAL | Status: AC
  Administered 2016-07-04: 100 mg via ORAL
  Filled 2016-07-04: qty 5

## 2016-07-04 NOTE — ED Provider Notes (Signed)
[facility] Emergency Department Provider Note    First MD Initiated Contact with Patient 07/04/16 2034     (approximate)  I have reviewed the triage vital signs and the nursing notes.   HISTORY  Chief Complaint Weakness and Dizziness    HPI Shelley Cameron is a 82 y.o. female who was seen at [facility] ER this morning for new seizure episode.  When asked why she is in the ER she states, "I don't know."   Patient was evaluated by neurology. Started on Keppra and arrange for outpatient follow-up. Patient also reported to have UTI was started on antibiotics for this. Family reported that she had a history of dementia and is also been having a nonproductive cough for the past several days. When I brought her home to give her antibiotics and patient started acting abnormal like she was having difficulty getting up out of the chair and appeared weak. They brought her to the ER for further evaluation.   Past Medical History:  Diagnosis Date  . A-fib ([place])   . Arthritis   . Cancer [facility])    breast cancer. Bilateral mastectomy.   . Diabetes mellitus without complication ([place])   . Gout   . Hypertension   . Moderate dementia   . Severe mitral insufficiency   . Stroke [facility]) 2011  . Systolic CHF ([place])   . Urinary incontinence    Family History  Problem Relation Age of Onset  . Hypertension Father    Past Surgical History:  Procedure Laterality Date  . BREAST SURGERY Left 08/1997   Dr Smith/mastectomy  . CATARACT EXTRACTION  2000  . MASTECTOMY Right 02/19/1997   Dr Bary Castilla   Patient Active Problem List   Diagnosis Date Noted  . Chest wall pain 05/30/2016      Prior to Admission medications   Medication Sig Start Date End Date Taking? Authorizing Provider  amLODipine (NORVASC) 5 MG tablet Take 5 mg by mouth daily.    Yes Historical Provider, MD  cefUROXime (CEFTIN) 250 MG tablet Take 250 mg by mouth 2 (two) times daily.   Yes Historical  Provider, MD  Cholecalciferol (VITAMIN D) 2000 units CAPS Take 1,000 Units by mouth daily.   Yes Historical Provider, MD  colchicine 0.6 MG tablet Take 0.6 mg by mouth as needed.    Yes Historical Provider, MD  donepezil (ARICEPT) 5 MG tablet Take 5 mg by mouth at bedtime.   Yes Historical Provider, MD  hydrochlorothiazide (HYDRODIURIL) 12.5 MG tablet Take 12.5 mg by mouth daily.   Yes Historical Provider, MD  levETIRAcetam (KEPPRA) 500 MG tablet Take 250 mg by mouth 2 (two) times daily.   Yes Historical Provider, MD  lovastatin (MEVACOR) 40 MG tablet Take 40 mg by mouth at bedtime.   Yes Historical Provider, MD  metoprolol succinate (TOPROL-XL) 50 MG 24 hr tablet Take 25 mg by mouth daily. Take with or immediately following a meal.   Yes Historical Provider, MD  Rivaroxaban (XARELTO) 15 MG TABS tablet Take 15 mg by mouth daily with supper.    Yes Historical Provider, MD  vitamin B-12 (CYANOCOBALAMIN) 1000 MCG tablet Take 1,000 mcg by mouth daily.   Yes Historical Provider, MD  bacitracin-polymyxin b (POLYSPORIN) ophthalmic ointment Place into the right eye 2 (two) times daily. Place a 1/2 inch ribbon of ointment into the lower eyelid. 07/04/16 07/11/16  Merlyn Lot, MD  benzonatate (TESSALON PERLES) 100 MG capsule Take 1 capsule (100 mg total) by  mouth 2 (two) times daily as needed for cough. 07/04/16 07/11/16  Merlyn Lot, MD    Allergies Codeine    Social History Social History  Substance Use Topics  . Smoking status: Never Smoker  . Smokeless tobacco: Never Used  . Alcohol use No    Review of Systems Patient denies headaches, rhinorrhea, blurry vision, numbness, shortness of breath, chest pain, edema, cough, abdominal pain, nausea, vomiting, diarrhea, dysuria, fevers, rashes or hallucinations unless otherwise stated above in HPI. ____________________________________________   PHYSICAL EXAM:  VITAL SIGNS: Vitals:   07/04/16 2307 07/04/16 2330  BP: 119/74 116/85    Pulse: (!) 117 99  Resp: 14 12  Temp: 98.5 F (36.9 C)     Constitutional: Alert elderly appearing and in no acute distress. Eyes: Conjunctivae of right eye with injection and green purulent drainage, no chemosis.  No proptosis. PERRL. EOMI. Head: Atraumatic. Nose: No congestion/rhinnorhea. Mouth/Throat: Mucous membranes are moist.  Oropharynx non-erythematous. Neck: No stridor. Painless ROM. No cervical spine tenderness to palpation Hematological/Lymphatic/Immunilogical: No cervical lymphadenopathy. Cardiovascular: Normal rate, regular rhythm. Grossly normal heart sounds.  Good peripheral circulation. Respiratory: Normal respiratory effort.  No retractions. Lungs CTAB. Gastrointestinal: Soft and nontender. No distention. No abdominal bruits. No CVA tenderness. Genitourinary:  Musculoskeletal: No lower extremity tenderness nor edema.  No joint effusions. Neurologic:  Normal speech and language. No gross focal neurologic deficits are appreciated. No gait instability. Skin:  Skin is warm, dry and intact. No rash noted. Psychiatric: Mood and affect are normal. Speech and behavior are normal.  ____________________________________________   LABS (all labs ordered are listed, but only abnormal results are displayed)  Results for orders placed or performed during the hospital encounter of 07/04/16 (from the past 24 hour(s))  CBC     Status: None   Collection Time: 07/04/16  8:48 PM  Result Value Ref Range   WBC 10.2 3.6 - 11.0 K/uL   RBC 4.91 3.80 - 5.20 MIL/uL   Hemoglobin 13.9 12.0 - 16.0 g/dL   HCT 40.8 35.0 - 47.0 %   MCV 83.0 80.0 - 100.0 fL   MCH 28.2 26.0 - 34.0 pg   MCHC 34.0 32.0 - 36.0 g/dL   RDW 14.4 11.5 - 14.5 %   Platelets 232 150 - 440 K/uL  Comprehensive metabolic panel     Status: Abnormal   Collection Time: 07/04/16  8:48 PM  Result Value Ref Range   Sodium 135 135 - 145 mmol/L   Potassium 3.4 (L) 3.5 - 5.1 mmol/L   Chloride 96 (L) 101 - 111 mmol/L   CO2 29  22 - 32 mmol/L   Glucose, Bld 134 (H) 65 - 99 mg/dL   BUN 18 6 - 20 mg/dL   Creatinine, Ser 1.13 (H) 0.44 - 1.00 mg/dL   Calcium 8.5 (L) 8.9 - 10.3 mg/dL   Total Protein 7.5 6.5 - 8.1 g/dL   Albumin 3.5 3.5 - 5.0 g/dL   AST 34 15 - 41 U/L   ALT 19 14 - 54 U/L   Alkaline Phosphatase 81 38 - 126 U/L   Total Bilirubin 1.4 (H) 0.3 - 1.2 mg/dL   GFR calc non Af Amer 43 (L) >60 mL/min   GFR calc Af Amer 50 (L) >60 mL/min   Anion gap 10 5 - 15  Urinalysis complete, with microscopic     Status: Abnormal   Collection Time: 07/04/16 10:58 PM  Result Value Ref Range   Color, Urine YELLOW (A) YELLOW   APPearance  CLEAR (A) CLEAR   Glucose, UA NEGATIVE NEGATIVE mg/dL   Bilirubin Urine NEGATIVE NEGATIVE   Ketones, ur NEGATIVE NEGATIVE mg/dL   Specific Gravity, Urine 1.009 1.005 - 1.030   Hgb urine dipstick 1+ (A) NEGATIVE   pH 6.0 5.0 - 8.0   Protein, ur 100 (A) NEGATIVE mg/dL   Nitrite NEGATIVE NEGATIVE   Leukocytes, UA NEGATIVE NEGATIVE   RBC / HPF 0-5 0 - 5 RBC/hpf   WBC, UA 0-5 0 - 5 WBC/hpf   Bacteria, UA RARE (A) NONE SEEN   Squamous Epithelial / LPF 0-5 (A) NONE SEEN   ____________________________________________  EKG My review and personal interpretation at Time: 20:38   Indication: ams  Rate: 100  Rhythm: afib Axis: normal Other: non specific st changes, no acute ischemia ____________________________________________  RADIOLOGY  I personally reviewed all radiographic images ordered to evaluate for the above acute complaints and reviewed radiology reports and findings.  These findings were personally discussed with the patient.  Please see medical record for radiology report. ____________________________________________   PROCEDURES  Procedure(s) performed: none Procedures    Critical Care performed: no ____________________________________________   INITIAL IMPRESSION / ASSESSMENT AND PLAN / ED COURSE  Pertinent labs & imaging results that were available during my  care of the patient were reviewed by me and considered in my medical decision making (see chart for details).  DDX: dehydration, cva, seizure, hypoglycemia, dysrhythmia, pna, uti  Shelley Cameron is a 82 y.o. who presents to the ED with confusion and weakness as described above.Patient is AFVSS in ED. Exam as above. Given current presentation have considered the above differential.  Patient with reassuring neuro exam at this time. Patient had extensive evaluation including neurology consult at [facility] this morning. Do not feel repeat neuro imaging clinically indicated at this time. No evidence of seizure-like activity at this time. Patient does have a cough therefore will repeat chest x-ray to evaluate for any worsening infiltrate. We'll repeat UA as well as laboratory evaluation.  The patient will be placed on continuous pulse oximetry and telemetry for monitoring.  Laboratory evaluation will be sent to evaluate for the above complaints.     Clinical Course as of Jul 04 2342  Wed Jul 04, 2016  2342 Blood work is reassuring. Repeat neuro exam is unremarkable. Episode of altered mental status likely secondary to medication side effect given recently starting Keppra. Do not feel further diagnostic testing clinically indicated at this time.  Patient was able to tolerate PO and was able to ambulate with a steady gait.  Patient does have evidence of purulent conjunctivae and sclerae are without evidence of orbital cellulitis or proptosis. Extraocular motions were intact. We'll treat with ophthalmic antibiotics. Have discussed with the patient and available family all diagnostics and treatments performed thus far and all questions were answered to the best of my ability. The patient demonstrates understanding and agreement with plan.   [PR]    Clinical Course User Index [PR] Merlyn Lot, MD     ____________________________________________   FINAL CLINICAL IMPRESSION(S) / ED  DIAGNOSES  Final diagnoses:  Acute bacterial conjunctivitis of right eye  Weakness  Cough      NEW MEDICATIONS STARTED DURING THIS VISIT:  New Prescriptions   BACITRACIN-POLYMYXIN B (POLYSPORIN) OPHTHALMIC OINTMENT    Place into the right eye 2 (two) times daily. Place a 1/2 inch ribbon of ointment into the lower eyelid.   BENZONATATE (TESSALON PERLES) 100 MG CAPSULE    Take 1 capsule (  100 mg total) by mouth 2 (two) times daily as needed for cough.     Note:  This document was prepared using Dragon voice recognition software and may include unintentional dictation errors.    Merlyn Lot, MD 07/04/16 [phone]

## 2016-07-04 NOTE — ED Triage Notes (Signed)
Pt arrived to ED from home via EMS reporting dizziness and weakness beginning at an unknown time. PT verbalized "my daughter calls when she wants to because I don't answer when she thinks I should answer." Pt was seen at [facility] today for seizures and discharged with a prescription for Keppra. Pt reports this was not her first seizure though. Pt presents with no neuro deficits and alert and oriented to self and situation but disoriented to time and place. Unknown if this is baseline at this time.

## 2016-07-05 NOTE — ED Notes (Signed)
Discharge instructions reviewed with patient. Questions fielded by this RN. Patient verbalizes understanding of instructions. Patient discharged home in stable condition per Quentin Cornwall MD . No acute distress noted at time of discharge.

## 2016-07-10 DIAGNOSIS — G40209 Localization-related (focal) (partial) symptomatic epilepsy and epileptic syndromes with complex partial seizures, not intractable, without status epilepticus: Secondary | ICD-10-CM | POA: Diagnosis not present

## 2016-07-10 DIAGNOSIS — I482 Chronic atrial fibrillation: Secondary | ICD-10-CM | POA: Diagnosis not present

## 2016-07-10 DIAGNOSIS — I5020 Unspecified systolic (congestive) heart failure: Secondary | ICD-10-CM | POA: Diagnosis not present

## 2016-07-10 DIAGNOSIS — E119 Type 2 diabetes mellitus without complications: Secondary | ICD-10-CM | POA: Diagnosis not present

## 2016-07-10 DIAGNOSIS — F0390 Unspecified dementia without behavioral disturbance: Secondary | ICD-10-CM | POA: Diagnosis not present

## 2016-07-11 DIAGNOSIS — R55 Syncope and collapse: Secondary | ICD-10-CM | POA: Diagnosis not present

## 2016-07-11 DIAGNOSIS — I482 Chronic atrial fibrillation: Secondary | ICD-10-CM | POA: Diagnosis not present

## 2016-07-11 DIAGNOSIS — N390 Urinary tract infection, site not specified: Secondary | ICD-10-CM | POA: Diagnosis not present

## 2016-07-13 DIAGNOSIS — R569 Unspecified convulsions: Secondary | ICD-10-CM | POA: Diagnosis not present

## 2016-07-23 ENCOUNTER — Encounter: Payer: Self-pay | Admitting: General Surgery

## 2016-07-23 DIAGNOSIS — N390 Urinary tract infection, site not specified: Secondary | ICD-10-CM | POA: Diagnosis not present

## 2016-07-26 ENCOUNTER — Ambulatory Visit
Admission: RE | Admit: 2016-07-26 | Discharge: 2016-07-26 | Disposition: A | Discharge status: Home / Self Care | Payer: Commercial Managed Care - HMO | Source: Ambulatory Visit | Attending: Cardiology | Admitting: Cardiology

## 2016-07-26 ENCOUNTER — Encounter
Admission: RE | Admit: 2016-07-26 | Discharge: 2016-07-26 | Disposition: A | Discharge status: Home / Self Care | Payer: Commercial Managed Care - HMO | Source: Ambulatory Visit | Attending: Cardiology | Admitting: Cardiology

## 2016-07-26 DIAGNOSIS — I10 Essential (primary) hypertension: Secondary | ICD-10-CM | POA: Insufficient documentation

## 2016-07-26 DIAGNOSIS — I4891 Unspecified atrial fibrillation: Secondary | ICD-10-CM | POA: Diagnosis not present

## 2016-07-26 DIAGNOSIS — Z01818 Encounter for other preprocedural examination: Secondary | ICD-10-CM | POA: Insufficient documentation

## 2016-07-26 DIAGNOSIS — R9431 Abnormal electrocardiogram [ECG] [EKG]: Secondary | ICD-10-CM | POA: Diagnosis not present

## 2016-07-26 HISTORY — DX: Syncope and collapse: R55

## 2016-07-26 HISTORY — DX: Unspecified osteoarthritis, unspecified site: M19.90

## 2016-07-26 HISTORY — DX: Anemia, unspecified: D64.9

## 2016-07-26 HISTORY — DX: Cardiac murmur, unspecified: R01.1

## 2016-07-26 HISTORY — DX: Unspecified convulsions: R56.9

## 2016-07-26 HISTORY — DX: Lymphedema, not elsewhere classified: I89.0

## 2016-07-26 HISTORY — DX: Hyperlipidemia, unspecified: E78.5

## 2016-07-26 LAB — BASIC METABOLIC PANEL
ANION GAP: 7 (ref 5–15)
BUN: 22 mg/dL — ABNORMAL HIGH (ref 6–20)
CHLORIDE: 99 mmol/L — AB (ref 101–111)
CO2: 33 mmol/L — AB (ref 22–32)
CREATININE: 1.28 mg/dL — AB (ref 0.44–1.00)
Calcium: 9.2 mg/dL (ref 8.9–10.3)
GFR calc non Af Amer: 37 mL/min — ABNORMAL LOW (ref >60)
GFR, EST AFRICAN AMERICAN: 43 mL/min — AB (ref >60)
Glucose, Bld: 113 mg/dL — ABNORMAL HIGH (ref 65–99)
POTASSIUM: 3.1 mmol/L — AB (ref 3.5–5.1)
SODIUM: 139 mmol/L (ref 135–145)

## 2016-07-26 LAB — CBC
HCT: 40.5 % (ref 35.0–47.0)
HEMOGLOBIN: 13.9 g/dL (ref 12.0–16.0)
MCH: 28.4 pg (ref 26.0–34.0)
MCHC: 34.3 g/dL (ref 32.0–36.0)
MCV: 83 fL (ref 80.0–100.0)
PLATELETS: 235 10*3/uL (ref 150–440)
RBC: 4.89 MIL/uL (ref 3.80–5.20)
RDW: 15 % — ABNORMAL HIGH (ref 11.5–14.5)
WBC: 8.4 10*3/uL (ref 3.6–11.0)

## 2016-07-26 LAB — PROTIME-INR
INR: 1.42
PROTHROMBIN TIME: 17.5 s — AB (ref 11.4–15.2)

## 2016-07-26 LAB — DIFFERENTIAL
Basophils Absolute: 0.1 10*3/uL (ref 0–0.1)
Basophils Relative: 1 %
Eosinophils Absolute: 0.5 10*3/uL (ref 0–0.7)
Eosinophils Relative: 6 %
LYMPHS ABS: 2.7 10*3/uL (ref 1.0–3.6)
LYMPHS PCT: 32 %
Monocytes Absolute: 1 10*3/uL — ABNORMAL HIGH (ref 0.2–0.9)
Monocytes Relative: 13 %
NEUTROS ABS: 4 10*3/uL (ref 1.4–6.5)
NEUTROS PCT: 48 %

## 2016-07-26 LAB — SURGICAL PCR SCREEN
MRSA, PCR: NEGATIVE
Staphylococcus aureus: NEGATIVE

## 2016-07-26 LAB — APTT: APTT: 33 s (ref 24–36)

## 2016-07-26 NOTE — Patient Instructions (Signed)
  Your procedure is scheduled on: August 01, 2016 (Wednesday) Report to Same Day Surgery 2nd floor medical mall [facility] Entrance-take elevator on left to 2nd floor.  Check in with surgery information desk.) To find out your arrival time please call [phone] between 1PM - 3PM on July 31, 2016 (Tuesday)  Remember: Instructions that are not followed completely may result in serious medical risk, up to and including death, or upon the discretion of your surgeon and anesthesiologist your surgery may need to be rescheduled.    _x___ 1. Do not eat food or drink liquids after midnight. No gum chewing or hard candies.     __x__ 2. No Alcohol for 24 hours before or after surgery.   __x__3. No Smoking for 24 prior to surgery.   ____  4. Bring all medications with you on the day of surgery if instructed.    __x__ 5. Notify your doctor if there is any change in your medical condition     (cold, fever, infections).     Do not wear jewelry, make-up, hairpins, clips or nail polish.  Do not wear lotions, powders, or perfumes. You may wear deodorant.  Do not shave 48 hours prior to surgery. Men may shave face and neck.  Do not bring valuables to the hospital.    [facility] is not responsible for any belongings or valuables.               Contacts, dentures or bridgework may not be worn into surgery.  Leave your suitcase in the car. After surgery it may be brought to your room.  For patients admitted to the hospital, discharge time is determined by your treatment team.   Patients discharged the day of surgery will not be allowed to drive home.  You will need someone to drive you home and stay with you the night of your procedure.    Please read over the following fact sheets that you were given:   [id number] Preparing for Surgery and or MRSA Information   _x___ Take these medicines the morning of surgery with A SIP OF WATER:    1. TAKE ALL MEDICATIONS AS INSTRUCTED BY CARDIOLOGIST           2. kEPPRA    ____Fleets enema or Magnesium Citrate as directed.   _x___ Use CHG Soap or sage wipes as directed on instruction sheet   ____ Use inhalers on the day of surgery and bring to hospital day of surgery  ____ Stop metformin 2 days prior to surgery    ____ Take 1/2 of usual insulin dose the night before surgery and none on the morning of           surgery.   __x__ Stop Aspirin, Coumadin, Pllavix ,Eliquis, Effient, or Pradaxa (Daughter stated instructed to stop Xarelto five days prior to surgery by Dr. Nehemiah Massed )  x__ Stop Anti-inflammatories such as Advil, Aleve, Ibuprofen, Motrin, Naproxen,          Naprosyn, Goodies powders or aspirin products. Ok to take Tylenol.   _x___ Stop supplements until after surgery.  (Stop Vitamin B-12 until after surgery)  ____ Bring C-Pap to the hospital.

## 2016-07-27 NOTE — Pre-Procedure Instructions (Signed)
Met B and PT/PTT sent to Dr. Saralyn Pilar and Anesthesia for review.  Potassium 3.1, asked Dr. Saralyn Pilar if he wanted to order a supplement?

## 2016-07-27 NOTE — Pre-Procedure Instructions (Signed)
SPOKE WITH PATIRICA AT DR PARASCHOS NEEDS KT SUPP OR INCREASE. RECHECK AM SURGERY ORDERED

## 2016-07-30 DIAGNOSIS — E876 Hypokalemia: Secondary | ICD-10-CM | POA: Diagnosis not present

## 2016-08-01 ENCOUNTER — Ambulatory Visit: Payer: Commercial Managed Care - HMO

## 2016-08-01 ENCOUNTER — Ambulatory Visit: Payer: Commercial Managed Care - HMO | Admitting: Registered Nurse

## 2016-08-01 ENCOUNTER — Encounter
Admission: RE | Disposition: A | Discharge status: Home / Self Care | Payer: Self-pay | Source: Ambulatory Visit | Attending: Cardiology

## 2016-08-01 ENCOUNTER — Observation Stay: Payer: Commercial Managed Care - HMO

## 2016-08-01 ENCOUNTER — Observation Stay
Admission: RE | Admit: 2016-08-01 | Discharge: 2016-08-02 | Disposition: A | Discharge status: Home / Self Care | Payer: Commercial Managed Care - HMO | Source: Ambulatory Visit | Attending: Cardiology | Admitting: Cardiology

## 2016-08-01 DIAGNOSIS — F329 Major depressive disorder, single episode, unspecified: Secondary | ICD-10-CM | POA: Diagnosis not present

## 2016-08-01 DIAGNOSIS — Z7984 Long term (current) use of oral hypoglycemic drugs: Secondary | ICD-10-CM | POA: Diagnosis not present

## 2016-08-01 DIAGNOSIS — Z9013 Acquired absence of bilateral breasts and nipples: Secondary | ICD-10-CM | POA: Insufficient documentation

## 2016-08-01 DIAGNOSIS — I4891 Unspecified atrial fibrillation: Secondary | ICD-10-CM | POA: Diagnosis not present

## 2016-08-01 DIAGNOSIS — M109 Gout, unspecified: Secondary | ICD-10-CM | POA: Insufficient documentation

## 2016-08-01 DIAGNOSIS — Z8673 Personal history of transient ischemic attack (TIA), and cerebral infarction without residual deficits: Secondary | ICD-10-CM | POA: Insufficient documentation

## 2016-08-01 DIAGNOSIS — E785 Hyperlipidemia, unspecified: Secondary | ICD-10-CM | POA: Insufficient documentation

## 2016-08-01 DIAGNOSIS — E119 Type 2 diabetes mellitus without complications: Secondary | ICD-10-CM | POA: Insufficient documentation

## 2016-08-01 DIAGNOSIS — I509 Heart failure, unspecified: Secondary | ICD-10-CM | POA: Diagnosis not present

## 2016-08-01 DIAGNOSIS — Z7901 Long term (current) use of anticoagulants: Secondary | ICD-10-CM | POA: Diagnosis not present

## 2016-08-01 DIAGNOSIS — R001 Bradycardia, unspecified: Secondary | ICD-10-CM | POA: Diagnosis not present

## 2016-08-01 DIAGNOSIS — E559 Vitamin D deficiency, unspecified: Secondary | ICD-10-CM | POA: Insufficient documentation

## 2016-08-01 DIAGNOSIS — Z006 Encounter for examination for normal comparison and control in clinical research program: Secondary | ICD-10-CM | POA: Diagnosis not present

## 2016-08-01 DIAGNOSIS — Z950 Presence of cardiac pacemaker: Secondary | ICD-10-CM | POA: Diagnosis not present

## 2016-08-01 DIAGNOSIS — I482 Chronic atrial fibrillation: Secondary | ICD-10-CM | POA: Diagnosis not present

## 2016-08-01 DIAGNOSIS — M1990 Unspecified osteoarthritis, unspecified site: Secondary | ICD-10-CM | POA: Diagnosis not present

## 2016-08-01 DIAGNOSIS — R011 Cardiac murmur, unspecified: Secondary | ICD-10-CM | POA: Diagnosis not present

## 2016-08-01 DIAGNOSIS — N390 Urinary tract infection, site not specified: Secondary | ICD-10-CM | POA: Diagnosis not present

## 2016-08-01 DIAGNOSIS — Z885 Allergy status to narcotic agent status: Secondary | ICD-10-CM | POA: Insufficient documentation

## 2016-08-01 DIAGNOSIS — D649 Anemia, unspecified: Secondary | ICD-10-CM | POA: Insufficient documentation

## 2016-08-01 DIAGNOSIS — I340 Nonrheumatic mitral (valve) insufficiency: Secondary | ICD-10-CM | POA: Diagnosis not present

## 2016-08-01 DIAGNOSIS — I110 Hypertensive heart disease with heart failure: Secondary | ICD-10-CM | POA: Insufficient documentation

## 2016-08-01 DIAGNOSIS — I639 Cerebral infarction, unspecified: Secondary | ICD-10-CM | POA: Diagnosis not present

## 2016-08-01 DIAGNOSIS — I495 Sick sinus syndrome: Principal | ICD-10-CM | POA: Diagnosis present

## 2016-08-01 DIAGNOSIS — Z853 Personal history of malignant neoplasm of breast: Secondary | ICD-10-CM | POA: Insufficient documentation

## 2016-08-01 DIAGNOSIS — R569 Unspecified convulsions: Secondary | ICD-10-CM | POA: Diagnosis not present

## 2016-08-01 HISTORY — PX: PACEMAKER INSERTION: SHX728

## 2016-08-01 LAB — GLUCOSE, CAPILLARY
GLUCOSE-CAPILLARY: 87 mg/dL (ref 65–99)
Glucose-Capillary: 99 mg/dL (ref 65–99)
Glucose-Capillary: 94 mg/dL (ref 65–99)

## 2016-08-01 SURGERY — INSERTION, CARDIAC PACEMAKER
Anesthesia: Monitor Anesthesia Care | Site: Shoulder | Laterality: Left

## 2016-08-01 MED ORDER — FAMOTIDINE 20 MG PO TABS
20.0000 mg | ORAL_TABLET | Freq: Once | ORAL | Status: AC
  Administered 2016-08-01: 20 mg via ORAL

## 2016-08-01 MED ORDER — HYDROCHLOROTHIAZIDE 12.5 MG PO CAPS
12.5000 mg | ORAL_CAPSULE | Freq: Every day | ORAL | Status: DC
  Administered 2016-08-01 – 2016-08-02 (×2): 12.5 mg via ORAL
  Filled 2016-08-01 (×2): qty 1

## 2016-08-01 MED ORDER — SODIUM CHLORIDE 0.9 % IJ SOLN
INTRAMUSCULAR | Status: AC
  Filled 2016-08-01: qty 50

## 2016-08-01 MED ORDER — AZELASTINE HCL 0.1 % NA SOLN
1.0000 | Freq: Two times a day (BID) | NASAL | Status: DC
  Administered 2016-08-01: 1 via NASAL
  Filled 2016-08-01: qty 30

## 2016-08-01 MED ORDER — AMLODIPINE BESYLATE 10 MG PO TABS
10.0000 mg | ORAL_TABLET | Freq: Every day | ORAL | Status: DC
  Administered 2016-08-01 – 2016-08-02 (×2): 10 mg via ORAL
  Filled 2016-08-01 (×2): qty 1

## 2016-08-01 MED ORDER — FENTANYL CITRATE (PF) 100 MCG/2ML IJ SOLN: 25.0000 ug | INTRAMUSCULAR | Status: DC | PRN

## 2016-08-01 MED ORDER — CEFAZOLIN IN D5W 1 GM/50ML IV SOLN
1.0000 g | Freq: Once | INTRAVENOUS | Status: AC
  Administered 2016-08-01: 1 g via INTRAVENOUS

## 2016-08-01 MED ORDER — SODIUM CHLORIDE 0.9% FLUSH
3.0000 mL | Freq: Two times a day (BID) | INTRAVENOUS | Status: DC
  Administered 2016-08-01 – 2016-08-02 (×2): 3 mL via INTRAVENOUS

## 2016-08-01 MED ORDER — LIDOCAINE 1 % OPTIME INJ - NO CHARGE
INTRAMUSCULAR | Status: DC | PRN
  Administered 2016-08-01: 30 mL

## 2016-08-01 MED ORDER — DONEPEZIL HCL 5 MG PO TABS
5.0000 mg | ORAL_TABLET | Freq: Every day | ORAL | Status: DC
  Administered 2016-08-01: 5 mg via ORAL
  Filled 2016-08-01: qty 1

## 2016-08-01 MED ORDER — PROPOFOL 500 MG/50ML IV EMUL
INTRAVENOUS | Status: DC | PRN
  Administered 2016-08-01: 50 ug/kg/min via INTRAVENOUS

## 2016-08-01 MED ORDER — ONDANSETRON HCL 4 MG/2ML IJ SOLN: 4.0000 mg | Freq: Once | INTRAMUSCULAR | Status: DC | PRN

## 2016-08-01 MED ORDER — ACETAMINOPHEN 325 MG PO TABS: 325.0000 mg | ORAL_TABLET | ORAL | Status: DC | PRN

## 2016-08-01 MED ORDER — BENZONATATE 100 MG PO CAPS: 200.0000 mg | ORAL_CAPSULE | Freq: Three times a day (TID) | ORAL | Status: DC | PRN

## 2016-08-01 MED ORDER — SODIUM CHLORIDE 0.9 % IV SOLN
INTRAVENOUS | Status: DC
  Administered 2016-08-01: 12:00:00 via INTRAVENOUS

## 2016-08-01 MED ORDER — SODIUM CHLORIDE 0.9 % IR SOLN
Status: DC | PRN
  Administered 2016-08-01: 120 mL

## 2016-08-01 MED ORDER — ONDANSETRON HCL 4 MG/2ML IJ SOLN: 4.0000 mg | Freq: Four times a day (QID) | INTRAMUSCULAR | Status: DC | PRN

## 2016-08-01 MED ORDER — LEVETIRACETAM 250 MG PO TABS
250.0000 mg | ORAL_TABLET | Freq: Two times a day (BID) | ORAL | Status: DC
  Administered 2016-08-01 – 2016-08-02 (×2): 250 mg via ORAL
  Filled 2016-08-01 (×2): qty 1

## 2016-08-01 MED ORDER — SODIUM CHLORIDE 0.9 % IR SOLN
Freq: Once | Status: DC
  Filled 2016-08-01: qty 2

## 2016-08-01 MED ORDER — FAMOTIDINE 20 MG PO TABS
ORAL_TABLET | ORAL | Status: AC
  Administered 2016-08-01: 20 mg via ORAL
  Filled 2016-08-01: qty 1

## 2016-08-01 MED ORDER — CEFAZOLIN IN D5W 1 GM/50ML IV SOLN
1.0000 g | Freq: Four times a day (QID) | INTRAVENOUS | Status: AC
  Administered 2016-08-01 – 2016-08-02 (×3): 1 g via INTRAVENOUS
  Filled 2016-08-01 (×4): qty 50

## 2016-08-01 MED ORDER — CEFAZOLIN IN D5W 1 GM/50ML IV SOLN
INTRAVENOUS | Status: AC
  Administered 2016-08-01: 1 g via INTRAVENOUS
  Filled 2016-08-01: qty 50

## 2016-08-01 SURGICAL SUPPLY — 38 items
BAG DECANTER FOR FLEXI CONT (MISCELLANEOUS) IMPLANT
BRUSH SCRUB 4% CHG (MISCELLANEOUS) ×3 IMPLANT
CABLE SURG 12 DISP A/V CHANNEL (MISCELLANEOUS) ×3 IMPLANT
CANISTER SUCT 1200ML W/VALVE (MISCELLANEOUS) ×3 IMPLANT
CHLORAPREP W/TINT 26ML (MISCELLANEOUS) ×3 IMPLANT
COVER LIGHT HANDLE STERIS (MISCELLANEOUS) ×6 IMPLANT
COVER MAYO STAND STRL (DRAPES) ×3 IMPLANT
DEVICE DISSECT PLASMABLAD 3.0S (MISCELLANEOUS) IMPLANT
DRAPE C-ARM XRAY 36X54 (DRAPES) ×3 IMPLANT
DRESSING TELFA 4X3 1S ST N-ADH (GAUZE/BANDAGES/DRESSINGS) ×3 IMPLANT
DRSG TEGADERM 4X4.75 (GAUZE/BANDAGES/DRESSINGS) ×3 IMPLANT
ELECT REM PT RETURN 9FT ADLT (ELECTROSURGICAL) ×3
ELECTRODE REM PT RTRN 9FT ADLT (ELECTROSURGICAL) ×1 IMPLANT
GLOVE BIO SURGEON STRL SZ7.5 (GLOVE) ×3 IMPLANT
GLOVE BIO SURGEON STRL SZ8 (GLOVE) ×3 IMPLANT
GOWN STRL REUS W/ TWL LRG LVL3 (GOWN DISPOSABLE) ×1 IMPLANT
GOWN STRL REUS W/ TWL XL LVL3 (GOWN DISPOSABLE) ×1 IMPLANT
GOWN STRL REUS W/TWL LRG LVL3 (GOWN DISPOSABLE) ×2
GOWN STRL REUS W/TWL XL LVL3 (GOWN DISPOSABLE) ×2
IMMOBILIZER SHDR MD LX WHT (SOFTGOODS) ×3 IMPLANT
IMMOBILIZER SHDR XL LX WHT (SOFTGOODS) IMPLANT
INTRO PACEMKR SHEATH II 7FR (MISCELLANEOUS) ×3
INTRODUCER PACEMKR SHTH II 7FR (MISCELLANEOUS) ×1 IMPLANT
IV NS 500ML (IV SOLUTION) ×2
IV NS 500ML BAXH (IV SOLUTION) ×1 IMPLANT
KIT RM TURNOVER STRD PROC AR (KITS) ×3 IMPLANT
LABEL OR SOLS (LABEL) ×3 IMPLANT
LEAD CAPSURE NOVUS 5076-58CM (Lead) ×3 IMPLANT
MARKER SKIN DUAL TIP RULER LAB (MISCELLANEOUS) ×3 IMPLANT
PACEMAKER ADAPTA SR ADSR01 (Pacemaker) ×1 IMPLANT
PACEMAKER STYLET GRAY (MISCELLANEOUS) ×2
PACEMAKER STYLET GRAY 58 (MISCELLANEOUS) ×1 IMPLANT
PACK PACE INSERTION (MISCELLANEOUS) ×3 IMPLANT
PAD ONESTEP ZOLL R SERIES ADT (MISCELLANEOUS) ×3 IMPLANT
PLASMABLADE 3.0S (MISCELLANEOUS)
PPM ADAPTA SR ADSR01 (Pacemaker) ×3 IMPLANT
SPONGE XRAY 4X4 16PLY STRL (MISCELLANEOUS) ×3 IMPLANT
SUT SILK 0 SH 30 (SUTURE) ×9 IMPLANT

## 2016-08-01 NOTE — Progress Notes (Signed)
Dr Vertis Kelch to check pt having fusion beats

## 2016-08-01 NOTE — Anesthesia Procedure Notes (Signed)
Date/Time: 08/01/2016 12:22 PM Performed by: Doreen Salvage Pre-anesthesia Checklist: Patient identified, Emergency Drugs available, Suction available and Patient being monitored Patient Re-evaluated:Patient Re-evaluated prior to inductionOxygen Delivery Method: Simple face mask Intubation Type: IV induction Dental Injury: Teeth and Oropharynx as per pre-operative assessment

## 2016-08-01 NOTE — Interval H&P Note (Signed)
History and Physical Interval Note:  08/01/2016 10:55 AM  Shelley Cameron  has presented today for surgery, with the diagnosis of SSS  The various methods of treatment have been discussed with the patient and family. After consideration of risks, benefits and other options for treatment, the patient has consented to  Procedure(s): INSERTION PACEMAKER (Left) as a surgical intervention .  The patient's history has been reviewed, patient examined, no change in status, stable for surgery.  I have reviewed the patient's chart and labs.  Questions were answered to the patient's satisfaction.     Fontaine Kossman [employer]

## 2016-08-01 NOTE — Anesthesia Preprocedure Evaluation (Signed)
Anesthesia Evaluation  Patient identified by MRN, date of birth, ID band Patient awake    Reviewed: Allergy & Precautions, NPO status , Patient's Chart, lab work & pertinent test results  Airway Mallampati: III       Dental  (+) Teeth Intact   Pulmonary neg pulmonary ROS,    breath sounds clear to auscultation       Cardiovascular Exercise Tolerance: Poor hypertension, Pt. on medications + dysrhythmias Atrial Fibrillation  Rhythm:Irregular Rate:Abnormal     Neuro/Psych Seizures -,  Depression CVA    GI/Hepatic negative GI ROS, Neg liver ROS,   Endo/Other  diabetes, Type 2, Oral Hypoglycemic Agents  Renal/GU negative Renal ROS     Musculoskeletal   Abdominal Normal abdominal exam  (+)   Peds  Hematology  (+) anemia ,   Anesthesia Other Findings   Reproductive/Obstetrics                             Anesthesia Physical Anesthesia Plan  ASA: III  Anesthesia Plan: MAC   Post-op Pain Management:    Induction: Intravenous  Airway Management Planned: Natural Airway and Nasal Cannula  Additional Equipment:   Intra-op Plan:   Post-operative Plan:   Informed Consent: I have reviewed the patients History and Physical, chart, labs and discussed the procedure including the risks, benefits and alternatives for the proposed anesthesia with the patient or authorized representative who has indicated his/her understanding and acceptance.     Plan Discussed with: CRNA  Anesthesia Plan Comments:         Anesthesia Quick Evaluation

## 2016-08-01 NOTE — Anesthesia Postprocedure Evaluation (Signed)
Anesthesia Post Note  Patient: Shelley Cameron  Procedure(s) Performed: Procedure(s) (LRB): INSERTION PACEMAKER (Left)  Patient location during evaluation: PACU Anesthesia Type: General Level of consciousness: awake Pain management: pain level controlled Vital Signs Assessment: post-procedure vital signs reviewed and stable Respiratory status: spontaneous breathing Cardiovascular status: stable Anesthetic complications: no    Last Vitals:  Vitals:   08/01/16 1355  BP: (!) 158/80  Pulse: (!) 55  Resp: (!) 21  Temp: 36.4 C    Last Pain: There were no vitals filed for this visit.               VAN STAVEREN,Zyquan Crotty

## 2016-08-01 NOTE — Transfer of Care (Signed)
Immediate Anesthesia Transfer of Care Note  Patient: Shelley Cameron  Procedure(s) Performed: Procedure(s): INSERTION PACEMAKER (Left)  Patient Location: PACU  Anesthesia Type:General  Level of Consciousness: sedated  Airway & Oxygen Therapy: Patient Spontanous Breathing and Patient connected to face mask oxygen  Post-op Assessment: Report given to RN and Post -op Vital signs reviewed and stable  Post vital signs: Reviewed and stable  Last Vitals:  Vitals:   08/01/16 1355  BP: (!) 158/80  Pulse: (!) 55  Resp: (!) 21  Temp: [id number] C    Complications: No apparent anesthesia complications

## 2016-08-01 NOTE — Op Note (Signed)
[facility] Cardiology   08/01/2016                     1:57 PM  PATIENT:  Shelley Cameron    PRE-OPERATIVE DIAGNOSIS:  SSS  POST-OPERATIVE DIAGNOSIS:  Same  PROCEDURE:  INSERTION PACEMAKER  SURGEON:  Isaias Cowman, MD    ANESTHESIA:     PREOPERATIVE INDICATIONS:  Shelley Cameron is a  82 y.o. female with a diagnosis of SSS who failed conservative measures and elected for surgical management.    The risks benefits and alternatives were discussed with the patient preoperatively including but not limited to the risks of infection, bleeding, cardiopulmonary complications, the need for revision surgery, among others, and the patient was willing to proceed.   OPERATIVE PROCEDURE: The patient was brought to the OR the fasting state. The left pectoral region was prepped and draped in usual sterile manner. Anesthesia was obtained 1% lidocaine locally. A 6 cm incision was performed a left pectoral region. The pacemaker pocket was generated by electrocautery and blunt dissection. Access was obtained to left subclavian vein by fine needle aspiration. MRI compatible lead was positioned to right ventricular apical septum. After proper thresholds were obtained the lead was sutured in place. The pacemaker, was irrigated with gentamicin solution. The lead was connected to a single chamber rate responsive pacemaker generator ( Medtronic [id number]). The pacemaker generator is positioned into the pocket the pocket was closed with -20 and 4-0 Vicryl, respectively. Steri-Strips and pressure dressing were applied.

## 2016-08-01 NOTE — H&P (Signed)
<6>29299-5<7>Encounter Details<6>46240-8<7>Social History<6>29762-2<7>Progress Notes<6>10164-2<7>Miscellaneous Notes<6>34109-9<7>Plan of Treatment<6>18776-5<7>Lab Results<6>30954-2<7>Visit Diagnoses<6>51848-0<7>Discontinued Medications<6>X-DCMED<7>/"> Jump to Section ? Discontinued MedicationsDocument InformationEncounter DetailsLab ResultsMiscellaneous NotesPatient DemographicsPlan of TreatmentProgress NotesReason for VisitSocial HistoryVisit Diagnoses Primitivo Gauze Encounter Summary, generated on Dec. 06, 2017 Printout Information  Document Contents Office Visit Document Received Date Dec. 06, 2017 [place]   Patient Demographics - 82 y.o. Female, born 1929/10/23   Patient Address Communication Language Race / Ethnicity  [address] [place], [place] [address] [phone] [facility]) [phone] (Home) English (Preferred) Black or African American / Not Hispanic or Latino  Reason for Visit    Reason Comments  Follow-up 4wks & Holter   Syncope     Consultation (Routine) Consultation (Routine)  Status Reason Specialty Diagnoses / Procedures Referred By Contact Referred To Contact  Authorized  Cardiovascular Disease / Cardiology Diagnoses  Essential (primary) hypertension  F/U WITH [facility] CP    Procedures  RETURN VISIT  Cheryll Cockayne, MD  [place]  [facility] [place], [place] [address]  Phone: [phone]  Fax: [phone]  Flossie Dibble, MD  [address]  [facility]  [place], [place] [address]  Phone: [phone]  Fax: [phone]      Encounter Details    Date Type Department Care Team Description  07/11/2016 Office Visit [facility]  [place], [place] [address]  [phone]  Flossie Dibble, MD  [address]  [facility]  [place], [place] [address]  [phone]    [phone] (Fax)  Chronic a-fib (CMS-HCC) (Primary Dx);  Cardiac syncope;  Acute UTI   Social History - as of this encounter   Tobacco Use Types Packs/Day Years Used Date  Never Smoker      Smokeless Tobacco: Never Used      Alcohol Use Drinks/Week oz/Week Comments  No 0 Standard drinks or equivalent  0.0     Sex Assigned at Birth Date Recorded  Not on file    Progress Notes - in this encounter   Table of Contents for Progress Notes Archie Balboa, CMA - 07/11/2016 4:15 PM EST Flossie Dibble, MD - 07/11/2016 4:15 PM EST   Archie Balboa, CMA - 07/11/2016 4:15 PM EST Spoke with Pt about Dr's advise. Pt verbalized understanding.  Back to top of Progress Notes Flossie Dibble, MD - 07/11/2016 4:15 PM EST Formatting of this note may be different from the original. Established Patient Visit   Chief Complaint: Chief Complaint  Patient presents with  . Follow-up  4wks & Holter  . Syncope  Date of Service: 07/11/2016 Date of Birth: Aug 13, 1930 PCP: Cheryll Cockayne, MD  History of Present Illness: Ms. Gatch is a 82 y.o.female patient  Chronic non-valvular atrial fibrillation The patient has chronic non-valvular atrial fibrillation for years with symptoms including dyspnea, irregular heart beat and syncope currently treated with metoprolol without heart rate control worsening with increased severity and frequency. The patient has risk factor and causes of atrial fibrillation including hypertension, valvular heart disease, sick sinus syndrome and structural heart disease and or heart dysfunction. The identified symptoms associated with this atrial fibrillation have altered the patient's quality of life and therefore has a severity of atrial fibrillation score of 3. The patient has been on anticoagulation for risk reduction of stroke with atrial fibrillation Syncope Patient presents with new onset of a sudden loss of consciousness without warning associated  with not doing anything and blacking out  for 4 minutes over the last 7 daysworsening with increased frequency with bradycardia, tachycardia, or ecg changes consistant with sss Holter The holter monitor shows occasional PVCs, ventricular bigeminy, PACs, atrial fibrillation, sick sinus syndrome and symptomatic bradycardia   Past Medical and Surgical History  Past Medical History Past Medical History:  Diagnosis Date  . Anemia, unspecified  hematocrits 32% to 34%.  . Arthritis  . Atrial fibrillation , unspecified (CMS-HCC) 12/14  echo with LVEF 40-45%; evaluated by Dr. Nehemiah Massed. Xarelto recommended.  . Congestive heart failure with left ventricular systolic dysfunction (CMS-HCC)  EF 40-45%  . Diabetes mellitus type 2, uncomplicated (CMS-HCC)  adult onset  . DJD (degenerative joint disease)  . Gout  . Heart murmur, unspecified  mild mitral regurgitation by echo 2003  . History of breast cancer  status post bilateral mastectomies and status post Tamoxifen therapy. She was previously followed by Dr. Oliva Bustard and Dr. Tamala Julian for this.  . Hyperlipidemia, unspecified  . Hypertension  . Lymphedema of upper extremity  with worsening approximately January 2007, followed by Surgery and Dr. Oliva Bustard.  Marland Kitchen Nonintractable epilepsy with complex partial seizures (CMS-HCC) 07/10/2016  Hospitalized at [facility] 11/17 for episode of tonic-clonic seizure activity. Evaluated by neurology. CT unremarkable. Keppra initiated.  Marland Kitchen PMB (postmenopausal bleeding)  . Syncopal episodes  Carotid Dopplers done Sept 2003 negative for occlusion. CT of the head without contrast negative for bleed. Stress Myoview performed, revealed no evidence of ischemia.Echocardiogram performed revealing preserved LV function, mild mitral regurgitation, without other abnormalities.  . Vitamin D deficiency, unspecified   Past Surgical History She has a past surgical history that includes Mastectomy (Bilateral); Cataract extraction (Bilateral); and  colonoscopy (07/13/2002).   Medications and Allergies  Current Medications  Current Outpatient Prescriptions  Medication Sig Dispense Refill  . amLODIPine (NORVASC) 10 MG tablet TAKE 1 TABLET BY MOUTH EVERY DAY 90 tablet 3  . azelastine (ASTELIN) 137 mcg nasal spray Place 1 spray into both nostrils 2 (two) times daily for 7 days. Then twice a day as needed 30 mL 5  . benzonatate (TESSALON) 200 MG capsule Take 1 capsule (200 mg total) by mouth 3 (three) times daily as needed for Cough. 30 capsule 0  . cefUROXime (CEFTIN) 250 MG tablet Take 1 tablet (250 mg total) by mouth 2 (two) times daily for 7 days. 14 tablet 0  . CHOLECALCIFEROL, VITAMIN D3, (VITAMIN D3 ORAL) Take by mouth.  . colchicine (COLCRYS) 0.6 mg tablet Take 2 tablets (1.2mg ) by mouth at first sign of gout flare followed by 1 tablet (0.6mg ) after 1 hour. (Max 1.8mg  within 1 hour) 30 tablet 11  . cyanocobalamin (VITAMIN B12) 1000 MCG tablet Take 1,000 mcg by mouth once daily.  Marland Kitchen donepezil (ARICEPT) 5 MG tablet Take 1 tablet (5 mg total) by mouth nightly. 30 tablet 2  . hydroCHLOROthiazide (MICROZIDE) 12.5 mg capsule TAKE ONE CAPSULE BY MOUTH EVERY DAY 90 capsule 0  . levETIRAcetam (KEPPRA) 500 MG tablet Take 0.5 tablets (250 mg total) by mouth 2 (two) times daily. 30 tablet 0  . levETIRAcetam (KEPPRA) 500 MG tablet Take 500 mg by mouth 2 (two) times daily.  Marland Kitchen levoFLOXacin (LEVAQUIN) 250 MG tablet Take 1 tablet (250 mg total) by mouth once daily for 10 days. 10 tablet 0  . lovastatin (MEVACOR) 40 MG tablet TAKE 1 TABLET BY MOUTH EVERY DAY WITH DINNER 90 tablet 3  . triamcinolone 0.5 % cream Apply topically 2 (two) times daily as needed (rash/itch). 15 g 11  . Alveda Reasons  15 mg tablet TAKE 1 TABLET(15 MG) BY MOUTH EVERY EVENING 30 tablet 6   No current facility-administered medications for this visit.   Allergies: Codeine and Micardis [telmisartan]  Social and Family History  Social History reports that she has never smoked. She has  never used smokeless tobacco. She reports that she does not drink alcohol or use drugs.  Family History Family History  Problem Relation Age of Onset  . Hypertension Father   Review of Systems   Review of Systems  Positive for syncope Negative for weight gain weight loss, weakness, vision change, hearing loss, cough, congestion, PND, orthopnea, heartburn, nausea, diaphoresis, vomiting, diarrhea, bloody stool, melena, stomach pain, extremity pain, leg weakness, leg cramping, leg blood clots, headache, blackouts, nosebleed, trouble swallowing, mouth pain, urinary frequency, urination at night, muscle weakness, skin lesions, skin rashes, tingling ,ulcers, numbness, anxiety, and/or depression Physical Examination   Vitals:BP (P) 124/70 (BP Location: Left upper arm, Patient Position: Sitting, BP Cuff Size: Adult)  Pulse (P) 64  Resp (P) 17  Ht (P) 160 cm (5\' 3" )  Wt (P) 66.1 kg (145 lb 12.8 oz)  LMP (LMP Unknown)  SpO2 (P) 96%  BMI (P) 25.83 kg/m  Ht:(P) 160 cm (5\' 3" ) Wt:(P) 66.1 kg (145 lb 12.8 oz) [id number] surface area is 1.71 meters squared (pended). Body mass index is 25.83 kg/m (pended). Appearance: well appearing in no acute distress HEENT: Pupils equally reactive to light and accomodation, no xanthalasma  Neck: Supple, no apparent thyromegaly, masses, or lymphadenopathy  Lungs: normal respiratory effort; no crackles, no rhonchi, no wheezes Heart: irregular rate and rhythm. Normal S1 S2 No gallops, murmur, no rub, PMI is normal size and placement. carotid upstroke normal without bruit. Jugular venous pressure is normal Abdomen: soft, nontender, not distended with normal bowel sounds. No apparent hepatosplenomegally. Abdominal aorta is normal size without bruit Extremities: no edema, no ulcers, no clubbing, no cyanosis Peripheral Pulses: 2+ in upper extremities, 2+ femoral pulses bilaterally, 2+lower extremity  Musculoskeletal; Normal muscle tone without kyphosis Neurological:  Oriented and Alert, Cranial nerves intact  Assessment   82 y.o. female with  Encounter Diagnoses  Name Primary?  . Chronic a-fib (CMS-HCC) Yes  . Cardiac syncope   Plan  -The patient is to have consultation and permanent pacemaker placement for sick sinus syndrome. The patient understands all risks and benefits of permanent pacemaker placement. This includes the possibility of death, stroke, heart attack, hemopericardium, pneumothorax, infection, bleeding, blood clot, and reaction to medications. The patient is at low risk for general anesthesia   No orders of the defined types were placed in this encounter.  No Follow-up on file.  Flossie Dibble, MD    Back to top of Progress Notes   Miscellaneous Notes - in this encounter   Addendum Note - Archie Balboa, [place] - 07/11/2016 4:15 PM EST Addended by: Archie Balboa on: 07/11/2016 05:18 PM  Modules accepted: Orders      Plan of Treatment - as of this encounter   Upcoming Encounters Upcoming Encounters  Date Type Specialty Care Team Description  08/13/2016 Procedure visit Podiatry Cherrie Gauze, DPM  [place]  [place], [place] [address]  [phone]  [phone] (Fax)    08/15/2016 Ancillary Orders Lab Cheryll Cockayne, [place]  [facility] [place], [place] [address]  [phone]  [phone] (Fax)    08/22/2016 Office Visit Internal Medicine Cheryll Cockayne, MD  [phone]  [place], [place] [address]  [phone]  [phone] (Fax)     Lab Results - in this encounter    Urinalysis w/Microscopic (07/23/2016 1:11 PM) Urinalysis w/Microscopic (07/23/2016 1:11 PM)  Component Value Ref Range  Color Yellow Yellow, Straw, Blue  Clarity Clear Clear, Slightly Cloudy, Turbid Other  Specific Gravity 1.010 1.000 - 1.030  pH, Urine 6.5 5.0 - 8.0  Protein, Urinalysis Trace  Negative, Trace mg/dL  Glucose, Urinalysis Negative Negative mg/dL  Ketones, Urinalysis Negative Negative mg/dL  Blood, Urinalysis Negative Negative  Nitrite, Urinalysis Negative Negative  Leukocyte Esterase, Urinalysis Negative Negative  White Blood Cells, Urinalysis None Seen None Seen, 0-3 /hpf  Red Blood Cells, Urinalysis 0-3 None Seen, 0-3 /hpf  Bacteria, Urinalysis None Seen None Seen /hpf  Squamous Epithelial Cells, Urinalysis Rare Rare, Few, None Seen /hpf   Urinalysis w/Microscopic (07/23/2016 1:11 PM)  Specimen Performing Laboratory  Urine [facility]  [place], [place] [address]     Visit Diagnoses    Diagnosis  Chronic a-fib (CMS-HCC) - Primary  Atrial fibrillation   Cardiac syncope  Syncope and collapse   Acute UTI  Urinary tract infection, site not specified    Discontinued Medications - as of this encounter   Prescription Sig. Discontinue Reason Start Date End Date  metoprolol succinate (TOPROL-XL) 50 MG XL tablet  Take 0.5 tablets (25 mg total) by mouth once daily. Alternate therapy 05/15/2016 07/11/2016   Images Document Information   Primary Care Provider Sheliah Hatch MD (Mar. 31, 2015 - Present) tel:[phone] (Work) fax:[phone] [place], [place] [address]  Document Coverage Dates Nov. 15, 2017 - Nov. 22, 2017  [place] [phone] (Work) [place], [place] [address]   Encounter Providers Bruce Lenn Sink MD (Attending) tel:[phone] (Work) fax:[phone] [address] [facility] [place], [place] [address]   Encounter Date Nov. 15, 2017   Show All Sections

## 2016-08-02 DIAGNOSIS — Z006 Encounter for examination for normal comparison and control in clinical research program: Secondary | ICD-10-CM | POA: Diagnosis not present

## 2016-08-02 DIAGNOSIS — I482 Chronic atrial fibrillation: Secondary | ICD-10-CM | POA: Diagnosis not present

## 2016-08-02 DIAGNOSIS — M109 Gout, unspecified: Secondary | ICD-10-CM | POA: Diagnosis not present

## 2016-08-02 DIAGNOSIS — N390 Urinary tract infection, site not specified: Secondary | ICD-10-CM | POA: Diagnosis not present

## 2016-08-02 DIAGNOSIS — R011 Cardiac murmur, unspecified: Secondary | ICD-10-CM | POA: Diagnosis not present

## 2016-08-02 DIAGNOSIS — E119 Type 2 diabetes mellitus without complications: Secondary | ICD-10-CM | POA: Diagnosis not present

## 2016-08-02 DIAGNOSIS — I495 Sick sinus syndrome: Secondary | ICD-10-CM | POA: Diagnosis not present

## 2016-08-02 DIAGNOSIS — I110 Hypertensive heart disease with heart failure: Secondary | ICD-10-CM | POA: Diagnosis not present

## 2016-08-02 DIAGNOSIS — I509 Heart failure, unspecified: Secondary | ICD-10-CM | POA: Diagnosis not present

## 2016-08-02 MED ORDER — METOPROLOL TARTRATE 50 MG PO TABS
50.0000 mg | ORAL_TABLET | Freq: Two times a day (BID) | ORAL | Status: DC
  Administered 2016-08-02: 50 mg via ORAL
  Filled 2016-08-02: qty 1

## 2016-08-02 MED ORDER — CEPHALEXIN 250 MG PO CAPS: 250.0000 mg | ORAL_CAPSULE | Freq: Four times a day (QID) | ORAL | 0 refills | Status: DC

## 2016-08-02 NOTE — Care Management (Signed)
Elective permanent pacemaker insertion.  No discharge needs identified by care team members

## 2016-08-02 NOTE — Progress Notes (Signed)
Pt confused but redirects easily.   Daughter at bedside this shift. Pacer site with old drainage marked.  Afib with pacing on demand and short runs PVCs noted on telemetry.

## 2016-08-02 NOTE — Discharge Instructions (Signed)
Do not lift left arm above head. Do not lift more than 15 lbs. above your head. May shower 08/03/2016. Leave steri strips on. Restart Xarelto 08/03/2016.

## 2016-08-02 NOTE — Progress Notes (Signed)
Pt discharge to home with daughter via transport.  Pt BP prior to leaving 110/50, HR 77 - a-fib on monitor.  Patient's daughter  verbalized understanding of d/c instructions.  Pt daughter also instructed on new medication metoprolol 50mg  BID - RN to call into pt home pharmacy per MD Paraschos.  Pt daughter verbalized understanding of follow-up instructions.  PIV d/c'd.

## 2016-08-02 NOTE — Progress Notes (Signed)
MD Paraschos called regarding patient HR in 120's-160's with activity.  Pt s/p pacemaker placement placed yesterday and set for discharge to home today with daughter.  RN instructed to order metoprolol tartrate 50mg  BID, give patient first dose and observe for thirty minutes.

## 2016-08-02 NOTE — Discharge Summary (Signed)
Physician Discharge Summary  Patient ID: Shelley Cameron MRN: [id number] DOB/AGE: Jun 13, 1930 82 y.o.  Admit date: 08/01/2016 Discharge date: 08/02/2016  Primary Discharge Diagnosis Bradycardia Secondary Discharge Diagnosis sick sinus syndrome  Significant Diagnostic Studies: radiology: X-Ray: No pneumothorax and yes  Consults: None  Hospital Course: The patient underwent elective single-chamber pacemaker on 08/01/2016 without periprocedural complication. She was returned to telemetry where she had an uncomplicated hospital course. On the morning of 08/02/2016 the patient was ambulating without difficulty and was discharged home   Discharge Exam: Blood pressure 140/77, pulse 70, temperature 98.2 F (36.8 C), temperature source Oral, resp. rate 18, weight 66.6 kg (146 lb 14.4 oz), SpO2 95 %.   General appearance: alert Head: atraumatic Eyes: conjunctivae/corneas clear. PERRL, EOM's intact. Fundi benign. Ears: normal TM's and external ear canals both ears Nose: Nares normal. Septum midline. Mucosa normal. No drainage or sinus tenderness. Throat: lips, mucosa, and tongue normal; teeth and gums normal Neck: no adenopathy, no carotid bruit, no JVD, supple, symmetrical, trachea midline and thyroid not enlarged, symmetric, no tenderness/mass/nodules Back: symmetric, no curvature. ROM normal. No CVA tenderness. Resp: clear to auscultation bilaterally Chest wall: no tenderness Cardio: regular rate and rhythm, S1, S2 normal, no murmur, click, rub or gallop GI: soft, non-tender; bowel sounds normal; no masses,  no organomegaly Extremities: extremities normal, atraumatic, no cyanosis or edema Pulses: 2+ and symmetric Skin: Skin color, texture, turgor normal. No rashes or lesions Neurologic: Grossly normal Labs:   Lab Results  Component Value Date   WBC 8.4 07/26/2016   HGB 13.9 07/26/2016   HCT 40.5 07/26/2016   MCV 83.0 07/26/2016   PLT 235 07/26/2016    Recent Labs Lab  07/26/16 1511  NA 139  K 3.1*  CL 99*  CO2 33*  BUN 22*  CREATININE 1.28*  CALCIUM 9.2  GLUCOSE 113*      Radiology: No pneumothorax on chest x-ray EKG: Atrial fibrillation with intermittent ventricular pacing  FOLLOW UP PLANS AND APPOINTMENTS    Medication List    TAKE these medications   acetaminophen 500 MG tablet Commonly known as:  TYLENOL Take 1,000 mg by mouth every 6 (six) hours as needed for mild pain.   amLODipine 5 MG tablet Commonly known as:  NORVASC Take 5 mg by mouth at bedtime.   azelastine 0.1 % nasal spray Commonly known as:  ASTELIN Place 1 spray into both nostrils 2 (two) times daily as needed for rhinitis or allergies.   benzonatate 200 MG capsule Commonly known as:  TESSALON Take 200 mg by mouth 3 (three) times daily as needed for cough.   cephALEXin 250 MG capsule Commonly known as:  KEFLEX Take 1 capsule (250 mg total) by mouth 4 (four) times daily.   colchicine 0.6 MG tablet Take 0.6 mg by mouth as needed. For gout   donepezil 5 MG tablet Commonly known as:  ARICEPT Take 5 mg by mouth at bedtime.   hydrochlorothiazide 12.5 MG tablet Commonly known as:  HYDRODIURIL Take 12.5 mg by mouth daily.   levETIRAcetam 500 MG tablet Commonly known as:  KEPPRA Take 250 mg by mouth 2 (two) times daily.   levofloxacin 250 MG tablet Commonly known as:  LEVAQUIN Take 250 mg by mouth daily. Given for UTI last dose should be 07-20-16   loratadine 10 MG dissolvable tablet Commonly known as:  CLARITIN REDITABS Take 10 mg by mouth daily as needed for allergies.   lovastatin 40 MG tablet Commonly known as:  MEVACOR  Take 40 mg by mouth at bedtime.   Rivaroxaban 15 MG Tabs tablet Commonly known as:  XARELTO Take 15 mg by mouth daily with supper.   vitamin B-12 1000 MCG tablet Commonly known as:  CYANOCOBALAMIN Take 1,000 mcg by mouth daily.   Vitamin D 2000 units Caps Take 2,000 Units by mouth daily.      Follow-up Information     Corey Skains, MD Follow up in 1 week(s).   Specialty:  Cardiology Contact information: [place] [facility] [place] [address] [phone]           BRING ALL MEDICATIONS WITH YOU TO FOLLOW UP APPOINTMENTS  Time spent with patient to include physician time: 25 minutes Signed:  Isaias Cowman MD, PhD, [facility] 08/02/2016, 9:14 AM

## 2016-08-03 ENCOUNTER — Other Ambulatory Visit: Payer: Commercial Managed Care - HMO

## 2016-08-03 ENCOUNTER — Emergency Department
Admission: EM | Admit: 2016-08-03 | Discharge: 2016-08-03 | Disposition: A | Discharge status: Home / Self Care | Payer: Commercial Managed Care - HMO | Attending: Emergency Medicine | Admitting: Emergency Medicine

## 2016-08-03 ENCOUNTER — Emergency Department: Payer: Commercial Managed Care - HMO

## 2016-08-03 ENCOUNTER — Encounter: Payer: Self-pay | Admitting: Emergency Medicine

## 2016-08-03 DIAGNOSIS — T82897A Other specified complication of cardiac prosthetic devices, implants and grafts, initial encounter: Secondary | ICD-10-CM | POA: Insufficient documentation

## 2016-08-03 DIAGNOSIS — E119 Type 2 diabetes mellitus without complications: Secondary | ICD-10-CM | POA: Diagnosis not present

## 2016-08-03 DIAGNOSIS — T829XXA Unspecified complication of cardiac and vascular prosthetic device, implant and graft, initial encounter: Secondary | ICD-10-CM | POA: Diagnosis not present

## 2016-08-03 DIAGNOSIS — I110 Hypertensive heart disease with heart failure: Secondary | ICD-10-CM | POA: Insufficient documentation

## 2016-08-03 DIAGNOSIS — Y828 Other medical devices associated with adverse incidents: Secondary | ICD-10-CM | POA: Insufficient documentation

## 2016-08-03 DIAGNOSIS — R0789 Other chest pain: Secondary | ICD-10-CM | POA: Diagnosis present

## 2016-08-03 DIAGNOSIS — Z79899 Other long term (current) drug therapy: Secondary | ICD-10-CM | POA: Diagnosis not present

## 2016-08-03 DIAGNOSIS — I5020 Unspecified systolic (congestive) heart failure: Secondary | ICD-10-CM | POA: Diagnosis not present

## 2016-08-03 DIAGNOSIS — R079 Chest pain, unspecified: Secondary | ICD-10-CM | POA: Diagnosis not present

## 2016-08-03 DIAGNOSIS — Z853 Personal history of malignant neoplasm of breast: Secondary | ICD-10-CM | POA: Insufficient documentation

## 2016-08-03 DIAGNOSIS — M79602 Pain in left arm: Secondary | ICD-10-CM | POA: Diagnosis not present

## 2016-08-03 LAB — BASIC METABOLIC PANEL
Anion gap: 8 (ref 5–15)
BUN: 21 mg/dL — AB (ref 6–20)
CHLORIDE: 95 mmol/L — AB (ref 101–111)
CO2: 32 mmol/L (ref 22–32)
Calcium: 9.1 mg/dL (ref 8.9–10.3)
Creatinine, Ser: 1.08 mg/dL — ABNORMAL HIGH (ref 0.44–1.00)
GFR calc Af Amer: 53 mL/min — ABNORMAL LOW (ref >60)
GFR calc non Af Amer: 46 mL/min — ABNORMAL LOW (ref >60)
GLUCOSE: 102 mg/dL — AB (ref 65–99)
POTASSIUM: 3.7 mmol/L (ref 3.5–5.1)
Sodium: 135 mmol/L (ref 135–145)

## 2016-08-03 LAB — CBC
HEMATOCRIT: 41 % (ref 35.0–47.0)
HEMOGLOBIN: 14.1 g/dL (ref 12.0–16.0)
MCH: 28.8 pg (ref 26.0–34.0)
MCHC: 34.5 g/dL (ref 32.0–36.0)
MCV: 83.4 fL (ref 80.0–100.0)
Platelets: 197 10*3/uL (ref 150–440)
RBC: 4.91 MIL/uL (ref 3.80–5.20)
RDW: 15 % — AB (ref 11.5–14.5)
WBC: 9.8 10*3/uL (ref 3.6–11.0)

## 2016-08-03 LAB — TROPONIN I: Troponin I: <0.03 ng/mL (ref <0.03)

## 2016-08-03 NOTE — ED Notes (Signed)
Per pt daughter, pt woke up at 5 am this morning c/o left sided chest pain and left arm numbness. Pt had pacemaker placed on Wednesday. Pt has hx of dementia and a-fib.

## 2016-08-03 NOTE — ED Triage Notes (Signed)
Pt presents to ED via [facility] from home with c/o left arm pain this morning. Pt reported to EMS pain lasting "a couple of seconds." Pt had pacemaker placed about 2 days ago. Pt denies chest pain, shortness of breath, or headache. Pt alert.

## 2016-08-03 NOTE — ED Provider Notes (Signed)
[facility] Emergency Department Provider Note   ____________________________________________   First MD Initiated Contact with Patient 08/03/16 [phone]     (approximate)  I have reviewed the triage vital signs and the nursing notes.   HISTORY  Chief Complaint Arm pain    HPI Shelley Cameron is a 82 y.o. female sick sinus syndrome with pacemaker placement 2 days ago  Patient woke up about 5 this morning, was noting intermittent sharp lancing pain in her left upper chest while laying on her right side. This would last about one second, occurred several times with variable timing distances.  No shortness of breath and denies having "chest pain" except for slight discomfort over the pacemaker pocket. Pain would shoot down the mid left arm. Pain will last for most 1 second.  No shortness of breath. No cough. No nausea or vomiting. Pacemaker site is healing well per patient's daughter who is also with her  Presently the patient denies any pain or discomfort  Past Medical History:  Diagnosis Date  . A-fib ([place])   . Anemia   . Arthritis   . Cancer ([place]) 1998, 1999   breast cancer. Bilateral mastectomy.   . Diabetes mellitus without complication ([place])   . DJD (degenerative joint disease)   . Gout   . Heart murmur   . Hyperlipidemia   . Hypertension   . Lymphedema of right arm   . Moderate dementia   . Seizures ([place])    Tonic-Clonic seizure activity  . Severe mitral insufficiency   . Stroke [facility]) 2011  . Syncope   . Systolic CHF ([place])   . Urinary incontinence     Patient Active Problem List   Diagnosis Date Noted  . Sick sinus syndrome ([place]) 08/01/2016  . Chest wall pain 05/30/2016    Past Surgical History:  Procedure Laterality Date  . BREAST SURGERY Left 08/1997   Dr Smith/mastectomy  . CATARACT EXTRACTION  2000  . EYE SURGERY Bilateral    Cataract Extraction with IOL  . MASTECTOMY Right 02/19/1997   Dr Bary Castilla  . MASTECTOMY Left  1999   Dr. Rochel Brome, [facility]  . PACEMAKER INSERTION Left 08/01/2016   Procedure: INSERTION PACEMAKER;  Surgeon: Isaias Cowman, MD;  Location: [facility] ORS;  Service: Cardiovascular;  Laterality: Left;    Prior to Admission medications   Medication Sig Start Date End Date Taking? Authorizing Provider  acetaminophen (TYLENOL) 500 MG tablet Take 1,000 mg by mouth every 6 (six) hours as needed for mild pain.    Historical Provider, MD  amLODipine (NORVASC) 5 MG tablet Take 5 mg by mouth at bedtime.     Historical Provider, MD  azelastine (ASTELIN) 0.1 % nasal spray Place 1 spray into both nostrils 2 (two) times daily as needed for rhinitis or allergies.  07/10/16   Historical Provider, MD  benzonatate (TESSALON) 200 MG capsule Take 200 mg by mouth 3 (three) times daily as needed for cough. 07/10/16   Historical Provider, MD  cephALEXin (KEFLEX) 250 MG capsule Take 1 capsule (250 mg total) by mouth 4 (four) times daily. 08/02/16   Clabe Seal, PA-C  Cholecalciferol (VITAMIN D) 2000 units CAPS Take 2,000 Units by mouth daily.     Historical Provider, MD  colchicine 0.6 MG tablet Take 0.6 mg by mouth as needed. For gout    Historical Provider, MD  donepezil (ARICEPT) 5 MG tablet Take 5 mg by mouth at bedtime.    Historical Provider, MD  hydrochlorothiazide (HYDRODIURIL)  12.5 MG tablet Take 12.5 mg by mouth daily.    Historical Provider, MD  levETIRAcetam (KEPPRA) 500 MG tablet Take 250 mg by mouth 2 (two) times daily.    Historical Provider, MD  levofloxacin (LEVAQUIN) 250 MG tablet Take 250 mg by mouth daily. Given for UTI last dose should be 07-20-16 07/11/16   Historical Provider, MD  loratadine (CLARITIN REDITABS) 10 MG dissolvable tablet Take 10 mg by mouth daily as needed for allergies.    Historical Provider, MD  lovastatin (MEVACOR) 40 MG tablet Take 40 mg by mouth at bedtime.    Historical Provider, MD  Rivaroxaban (XARELTO) 15 MG TABS tablet Take 15 mg by mouth daily with supper.      Historical Provider, MD  vitamin B-12 (CYANOCOBALAMIN) 1000 MCG tablet Take 1,000 mcg by mouth daily.    Historical Provider, MD    Allergies Codeine  Family History  Problem Relation Age of Onset  . Hypertension Father     Social History Social History  Substance Use Topics  . Smoking status: Never Smoker  . Smokeless tobacco: Never Used  . Alcohol use No    Review of Systems Constitutional: No fever/chills Eyes: No visual changes. ENT: No sore throat. Cardiovascular: See history of present illness Respiratory: Denies shortness of breath. Gastrointestinal: No abdominal pain.  No nausea, no vomiting.  No diarrhea.  No constipation. Genitourinary: Negative for dysuria. Musculoskeletal: Negative for back pain. Skin: Negative for rash. Neurological: Negative for headaches, focal weakness or numbness.  10-point ROS otherwise negative.  ____________________________________________   PHYSICAL EXAM:  VITAL SIGNS: ED Triage Vitals  Enc Vitals Group     BP 08/03/16 0645 117/71     Pulse Rate 08/03/16 0645 75     Resp 08/03/16 0645 18     Temp 08/03/16 0645 98 F (36.7 C)     Temp Source 08/03/16 0645 Oral     SpO2 08/03/16 0645 97 %     Weight 08/03/16 0647 148 lb (67.1 kg)     Height 08/03/16 0647 5\' 3"  (1.6 m)     Head Circumference --      Peak Flow --      Pain Score --      Pain Loc --      Pain Edu? --      Excl. in [place]? --     Constitutional: Alert and oriented to self and place and daughter but not to. Well appearing and in no acute distress. Eyes: Conjunctivae are normal. PERRL. EOMI. Head: Atraumatic. Nose: No congestion/rhinnorhea. Mouth/Throat: Mucous membranes are moist.  Oropharynx non-erythematous. Neck: No stridor.   Cardiovascular: Normal rate, regular rhythm. Grossly normal heart sounds.  Good peripheral circulation. Left upper chest surgical site is clean dry and intact. There is no surrounding hematoma, there is a slight amount of local skin  induration but no evidence of erythema or warmth. Respiratory: Normal respiratory effort.  No retractions. Lungs CTAB. Gastrointestinal: Soft and nontender. No distention.  Musculoskeletal: No lower extremity tenderness nor edema.   Right hand Median, ulnar, radial motor intact. Cap refill less than 2 seconds all digits. Strong radial pulse. 5 out of 5 strength throughout the hand intrinsics, flexion and extension at the wrist. No evidence of trauma.  Left hand Median, ulnar, radial motor intact. Cap refill less than 2 seconds all digits. Strong radial pulse. 5 out of 5 strength throughout the hand intrinsics, flexion and extension at the wrist. No evidence of trauma. ____________________________________________  The patient  has no pronator drift. The patient has normal cranial nerve exam. Extraocular movements are normal. Visual fields are normal. Patient has 5 out of 5 strength in all extremities. There is no numbness or gross, acute sensory abnormality in the extremities bilaterally. No speech disturbance. No dysarthria. No aphasia. Patient speaking in full and clear sentences.  Neurologic:  Normal speech and language. No gross focal neurologic deficits are appreciated.  Skin:  Skin is warm, dry and intact. No rash noted. Psychiatric: Mood and affect are normal. Speech and behavior are normal.  ____________________________________________   LABS (all labs ordered are listed, but only abnormal results are displayed)  Labs Reviewed  BASIC METABOLIC PANEL - Abnormal; Notable for the following:       Result Value   Chloride 95 (*)    Glucose, Bld 102 (*)    BUN 21 (*)    Creatinine, Ser 1.08 (*)    GFR calc non Af Amer 46 (*)    GFR calc Af Amer 53 (*)    All other components within normal limits  CBC - Abnormal; Notable for the following:    RDW 15.0 (*)    All other components within normal limits  TROPONIN I    ____________________________________________  EKG  Reviewed his arrhythmia 7 AM Heart rate 100 QRS 190 QTc 470 Probable single chamer pacer, frequent ventricular beats with capture also narrow QRS complexes with capture vs fusion noted as well No evidence of an acute ischemic changes noted ____________________________________________  RADIOLOGY  Dg Chest Portable 1 View  Result Date: 08/03/2016 CLINICAL DATA:  Chest pain. EXAM: PORTABLE CHEST 1 VIEW COMPARISON:  Radiograph of August 01, 2016. FINDINGS: The heart size and mediastinal contours are within normal limits. Both lungs are clear. Single lead left-sided pacemaker is in grossly good position. No pneumothorax or pleural effusion is noted. Right axillary surgical clips are noted. The visualized skeletal structures are unremarkable. IMPRESSION: No acute cardiopulmonary abnormality seen. Electronically Signed   By: Marijo Conception, M.D.   On: 08/03/2016 07:34    ____________________________________________   PROCEDURES  Procedure(s) performed: None  Procedures  Critical Care performed: No  ____________________________________________   INITIAL IMPRESSION / ASSESSMENT AND PLAN / ED COURSE  Pertinent labs & imaging results that were available during my care of the patient were reviewed by me and considered in my medical decision making (see chart for details).  Intermittent sharp pain in the left upper chest reading to left arm only noted falling on her right side. Now resolved. Reassuring EKG and labs. Chest x-ray no acute abnormality noted. Appears pacemaker is functioning appropriately.  Symptoms be extremely atypical acute coronary syndrome, the location of the pain around the area of her pocket while laying on the right side and shooting intermittent lead to the left arm is suspicious and likely due to postoperative discomfort versus pacing, and she has no complaints such as shortness of breath or pleuritic pain.  Does not appear at significant risk for embolic disease or thromboembolism that she is anticoagulated, daughter gave a dose of Xarelto yesterday, and plans to resume this Saturday  ----------------------------------------- 7:53 AM on 08/03/2016 -----------------------------------------  Patient resting comfortably. No further complaints in the emergency room. Discussed with Dr. Josefa Half of cardiology, reviewed case carefully and he advises discharge and no further ER workup. Recommends regular follow-up as planned (Tuesday).  Return precautions and treatment recommendations and follow-up discussed with the patient and daughter who is agreeable with the plan.   Clinical Course  ____________________________________________   FINAL CLINICAL IMPRESSION(S) / ED DIAGNOSES  Final diagnoses:  Pacemaker complications, initial encounter      NEW MEDICATIONS STARTED DURING THIS VISIT:  New Prescriptions   No medications on file     Note:  This document was prepared using Dragon voice recognition software and may include unintentional dictation errors.     Delman Kitten, MD 08/03/16 [phone]

## 2016-08-03 NOTE — Discharge Instructions (Signed)
°  Please follow up with the recommended doctor as planned for Tuesday.  Continue to take your regular medications as advised by your cardiology doctor prior.   Return to the Emergency Department (ED) if you experience any further chest pain/pressure/tightness, difficulty breathing, or sudden sweating, or other symptoms that concern you.

## 2016-08-07 DIAGNOSIS — I10 Essential (primary) hypertension: Secondary | ICD-10-CM | POA: Diagnosis not present

## 2016-08-07 DIAGNOSIS — I5020 Unspecified systolic (congestive) heart failure: Secondary | ICD-10-CM | POA: Diagnosis not present

## 2016-08-07 DIAGNOSIS — I340 Nonrheumatic mitral (valve) insufficiency: Secondary | ICD-10-CM | POA: Diagnosis not present

## 2016-08-07 DIAGNOSIS — R0789 Other chest pain: Secondary | ICD-10-CM | POA: Diagnosis not present

## 2016-08-07 DIAGNOSIS — I482 Chronic atrial fibrillation: Secondary | ICD-10-CM | POA: Diagnosis not present

## 2016-08-07 DIAGNOSIS — R55 Syncope and collapse: Secondary | ICD-10-CM | POA: Diagnosis not present

## 2016-08-07 DIAGNOSIS — E119 Type 2 diabetes mellitus without complications: Secondary | ICD-10-CM | POA: Diagnosis not present

## 2016-08-07 DIAGNOSIS — E782 Mixed hyperlipidemia: Secondary | ICD-10-CM | POA: Diagnosis not present

## 2016-08-09 DIAGNOSIS — F0390 Unspecified dementia without behavioral disturbance: Secondary | ICD-10-CM | POA: Diagnosis not present

## 2016-08-09 DIAGNOSIS — E538 Deficiency of other specified B group vitamins: Secondary | ICD-10-CM | POA: Diagnosis not present

## 2016-08-09 DIAGNOSIS — R569 Unspecified convulsions: Secondary | ICD-10-CM | POA: Diagnosis not present

## 2016-08-13 DIAGNOSIS — B351 Tinea unguium: Secondary | ICD-10-CM | POA: Diagnosis not present

## 2016-08-13 DIAGNOSIS — E119 Type 2 diabetes mellitus without complications: Secondary | ICD-10-CM | POA: Diagnosis not present

## 2016-08-15 DIAGNOSIS — E119 Type 2 diabetes mellitus without complications: Secondary | ICD-10-CM | POA: Diagnosis not present

## 2016-08-15 DIAGNOSIS — I10 Essential (primary) hypertension: Secondary | ICD-10-CM | POA: Diagnosis not present

## 2016-08-15 DIAGNOSIS — R946 Abnormal results of thyroid function studies: Secondary | ICD-10-CM | POA: Diagnosis not present

## 2016-08-15 DIAGNOSIS — E538 Deficiency of other specified B group vitamins: Secondary | ICD-10-CM | POA: Diagnosis not present

## 2016-08-22 DIAGNOSIS — E782 Mixed hyperlipidemia: Secondary | ICD-10-CM | POA: Diagnosis not present

## 2016-08-22 DIAGNOSIS — I5020 Unspecified systolic (congestive) heart failure: Secondary | ICD-10-CM | POA: Diagnosis not present

## 2016-08-22 DIAGNOSIS — I10 Essential (primary) hypertension: Secondary | ICD-10-CM | POA: Diagnosis not present

## 2016-08-22 DIAGNOSIS — D649 Anemia, unspecified: Secondary | ICD-10-CM | POA: Diagnosis not present

## 2016-08-22 DIAGNOSIS — E538 Deficiency of other specified B group vitamins: Secondary | ICD-10-CM | POA: Diagnosis not present

## 2016-08-22 DIAGNOSIS — E119 Type 2 diabetes mellitus without complications: Secondary | ICD-10-CM | POA: Diagnosis not present

## 2016-08-22 DIAGNOSIS — I482 Chronic atrial fibrillation: Secondary | ICD-10-CM | POA: Diagnosis not present

## 2016-08-22 DIAGNOSIS — Z853 Personal history of malignant neoplasm of breast: Secondary | ICD-10-CM | POA: Diagnosis not present

## 2016-08-22 DIAGNOSIS — M1A00X0 Idiopathic chronic gout, unspecified site, without tophus (tophi): Secondary | ICD-10-CM | POA: Diagnosis not present

## 2016-10-02 DIAGNOSIS — I10 Essential (primary) hypertension: Secondary | ICD-10-CM | POA: Diagnosis not present

## 2016-10-02 DIAGNOSIS — M545 Low back pain: Secondary | ICD-10-CM | POA: Diagnosis not present

## 2016-10-02 DIAGNOSIS — I482 Chronic atrial fibrillation: Secondary | ICD-10-CM | POA: Diagnosis not present

## 2016-10-31 DIAGNOSIS — M10072 Idiopathic gout, left ankle and foot: Secondary | ICD-10-CM | POA: Diagnosis not present

## 2016-10-31 DIAGNOSIS — M2012 Hallux valgus (acquired), left foot: Secondary | ICD-10-CM | POA: Diagnosis not present

## 2016-11-08 DIAGNOSIS — E538 Deficiency of other specified B group vitamins: Secondary | ICD-10-CM | POA: Diagnosis not present

## 2016-11-08 DIAGNOSIS — F0390 Unspecified dementia without behavioral disturbance: Secondary | ICD-10-CM | POA: Diagnosis not present

## 2016-11-08 DIAGNOSIS — R569 Unspecified convulsions: Secondary | ICD-10-CM | POA: Diagnosis not present

## 2016-11-21 DIAGNOSIS — I5020 Unspecified systolic (congestive) heart failure: Secondary | ICD-10-CM | POA: Diagnosis not present

## 2016-11-21 DIAGNOSIS — E119 Type 2 diabetes mellitus without complications: Secondary | ICD-10-CM | POA: Diagnosis not present

## 2016-11-21 DIAGNOSIS — I482 Chronic atrial fibrillation: Secondary | ICD-10-CM | POA: Diagnosis not present

## 2016-11-22 DIAGNOSIS — E119 Type 2 diabetes mellitus without complications: Secondary | ICD-10-CM | POA: Diagnosis not present

## 2016-11-22 DIAGNOSIS — I5020 Unspecified systolic (congestive) heart failure: Secondary | ICD-10-CM | POA: Diagnosis not present

## 2016-11-22 DIAGNOSIS — I482 Chronic atrial fibrillation: Secondary | ICD-10-CM | POA: Diagnosis not present

## 2016-11-28 DIAGNOSIS — Z0000 Encounter for general adult medical examination without abnormal findings: Secondary | ICD-10-CM | POA: Diagnosis not present

## 2016-11-28 DIAGNOSIS — Z853 Personal history of malignant neoplasm of breast: Secondary | ICD-10-CM | POA: Diagnosis not present

## 2016-11-28 DIAGNOSIS — F0390 Unspecified dementia without behavioral disturbance: Secondary | ICD-10-CM | POA: Diagnosis not present

## 2016-11-28 DIAGNOSIS — I340 Nonrheumatic mitral (valve) insufficiency: Secondary | ICD-10-CM | POA: Diagnosis not present

## 2016-11-28 DIAGNOSIS — E119 Type 2 diabetes mellitus without complications: Secondary | ICD-10-CM | POA: Diagnosis not present

## 2016-11-28 DIAGNOSIS — I5020 Unspecified systolic (congestive) heart failure: Secondary | ICD-10-CM | POA: Diagnosis not present

## 2016-11-28 DIAGNOSIS — I10 Essential (primary) hypertension: Secondary | ICD-10-CM | POA: Diagnosis not present

## 2016-11-28 DIAGNOSIS — I482 Chronic atrial fibrillation: Secondary | ICD-10-CM | POA: Diagnosis not present

## 2016-11-28 DIAGNOSIS — G40209 Localization-related (focal) (partial) symptomatic epilepsy and epileptic syndromes with complex partial seizures, not intractable, without status epilepticus: Secondary | ICD-10-CM | POA: Diagnosis not present

## 2016-11-28 DIAGNOSIS — D649 Anemia, unspecified: Secondary | ICD-10-CM | POA: Diagnosis not present

## 2017-01-01 DIAGNOSIS — I480 Paroxysmal atrial fibrillation: Secondary | ICD-10-CM | POA: Diagnosis not present

## 2017-01-15 IMAGING — CR DG CHEST 2V
1 series · 2 of 2 positions shown · non-contrast
Comparison: 05/30/2016

CLINICAL DATA: Altered mental status, cough, dizziness, and
weakness.

EXAM:
CHEST  2 VIEW

[Series 1: dg chest 2 view · 0.14mm/px · 2 of 2 slices shown]
[im 1/2]
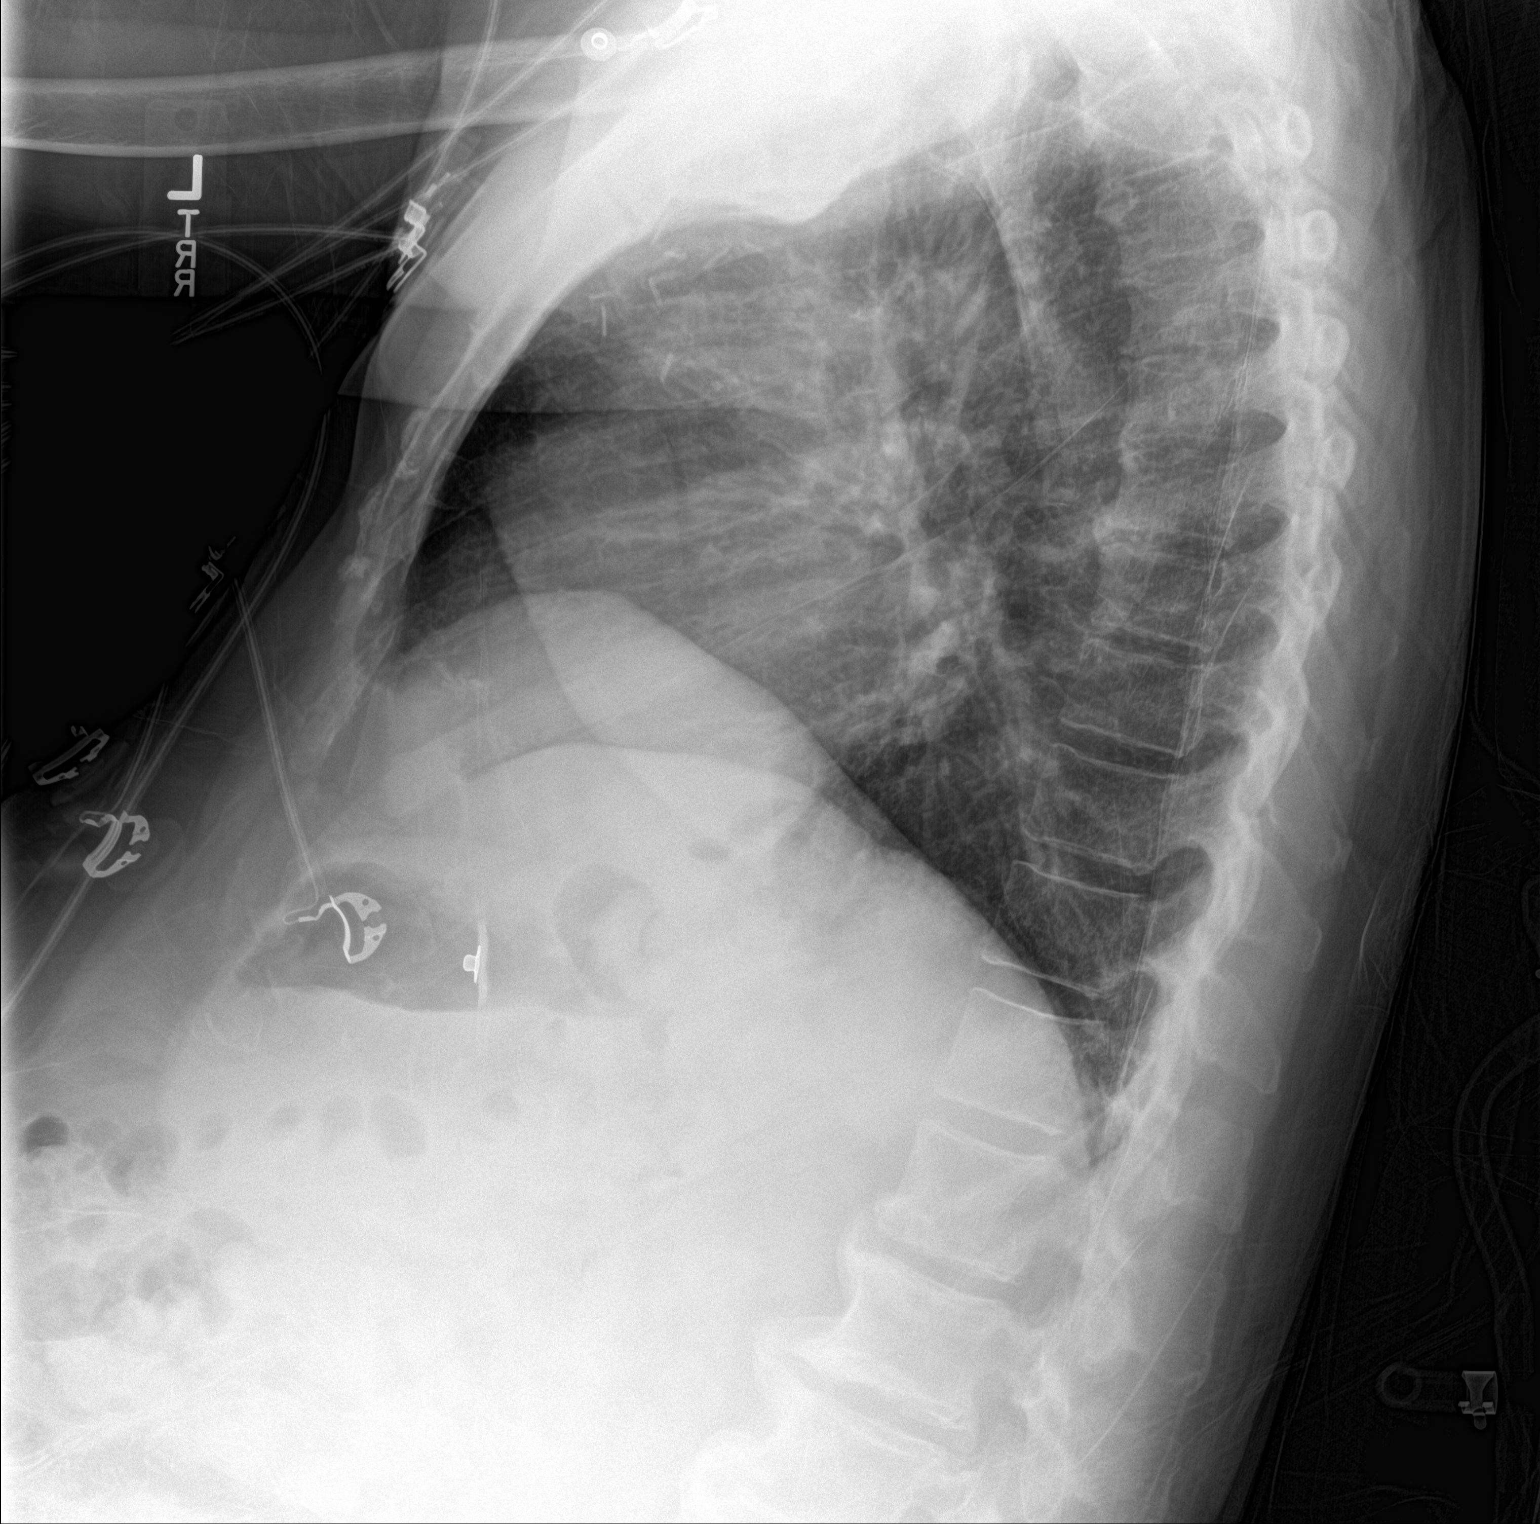
[im 2/2]
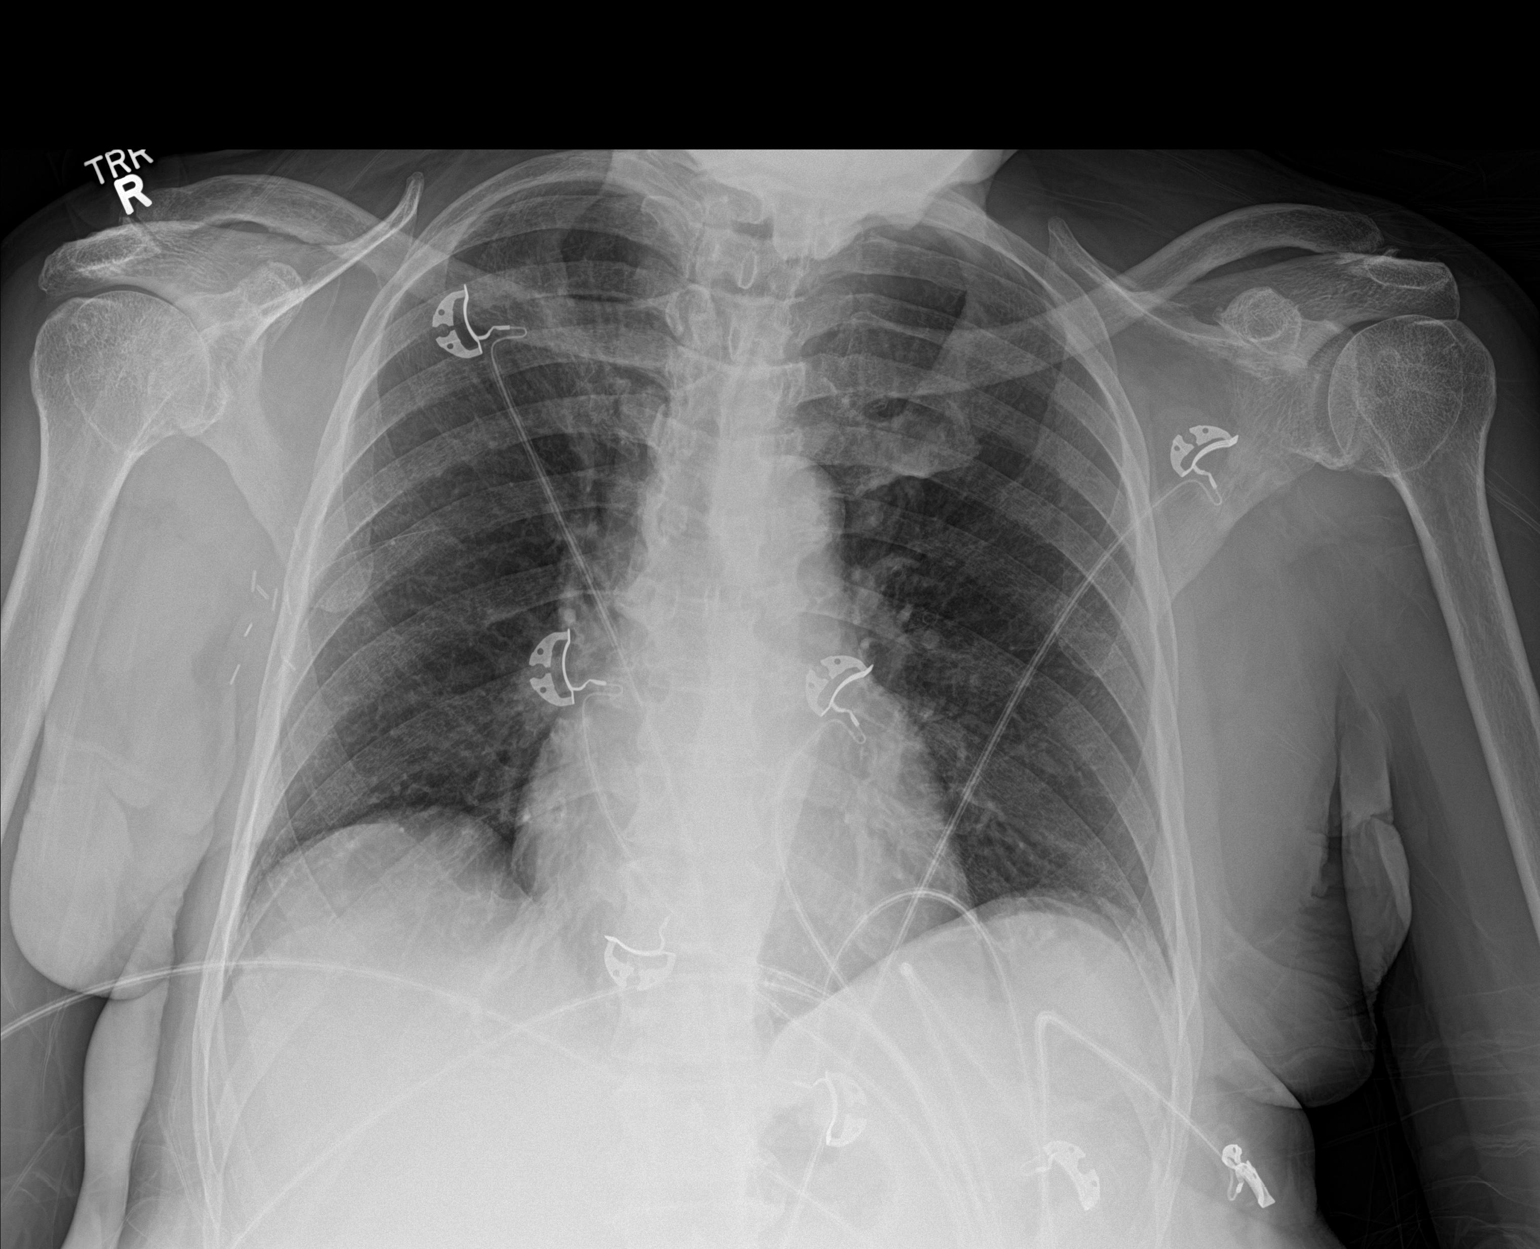

[2 of 2 positions shown; findings below may reference images not displayed]

FINDINGS: Shallow inspiration. The heart size and mediastinal contours are
within normal limits. Both lungs are clear. Degenerative changes in
the spine and shoulders. Surgical clips in the right axilla.
Calcified and tortuous aorta.
IMPRESSION: No active cardiopulmonary disease.

## 2017-02-05 DIAGNOSIS — R600 Localized edema: Secondary | ICD-10-CM | POA: Diagnosis not present

## 2017-02-18 DIAGNOSIS — E119 Type 2 diabetes mellitus without complications: Secondary | ICD-10-CM | POA: Diagnosis not present

## 2017-02-18 DIAGNOSIS — B351 Tinea unguium: Secondary | ICD-10-CM | POA: Diagnosis not present

## 2017-02-18 DIAGNOSIS — M19071 Primary osteoarthritis, right ankle and foot: Secondary | ICD-10-CM | POA: Diagnosis not present

## 2017-03-20 DIAGNOSIS — L03032 Cellulitis of left toe: Secondary | ICD-10-CM | POA: Diagnosis not present

## 2017-04-09 DIAGNOSIS — E119 Type 2 diabetes mellitus without complications: Secondary | ICD-10-CM | POA: Diagnosis not present

## 2017-04-09 DIAGNOSIS — I10 Essential (primary) hypertension: Secondary | ICD-10-CM | POA: Diagnosis not present

## 2017-04-09 DIAGNOSIS — E782 Mixed hyperlipidemia: Secondary | ICD-10-CM | POA: Diagnosis not present

## 2017-04-09 DIAGNOSIS — I5020 Unspecified systolic (congestive) heart failure: Secondary | ICD-10-CM | POA: Diagnosis not present

## 2017-04-09 DIAGNOSIS — I482 Chronic atrial fibrillation: Secondary | ICD-10-CM | POA: Diagnosis not present

## 2017-04-09 DIAGNOSIS — I340 Nonrheumatic mitral (valve) insufficiency: Secondary | ICD-10-CM | POA: Diagnosis not present

## 2017-04-09 DIAGNOSIS — R55 Syncope and collapse: Secondary | ICD-10-CM | POA: Diagnosis not present

## 2017-04-10 DIAGNOSIS — M1A00X0 Idiopathic chronic gout, unspecified site, without tophus (tophi): Secondary | ICD-10-CM | POA: Diagnosis not present

## 2017-04-17 DIAGNOSIS — M1A00X0 Idiopathic chronic gout, unspecified site, without tophus (tophi): Secondary | ICD-10-CM | POA: Diagnosis not present

## 2017-04-17 DIAGNOSIS — L03032 Cellulitis of left toe: Secondary | ICD-10-CM | POA: Diagnosis not present

## 2017-05-07 DIAGNOSIS — I952 Hypotension due to drugs: Secondary | ICD-10-CM | POA: Diagnosis not present

## 2017-05-07 DIAGNOSIS — I5020 Unspecified systolic (congestive) heart failure: Secondary | ICD-10-CM | POA: Diagnosis not present

## 2017-05-07 DIAGNOSIS — I482 Chronic atrial fibrillation: Secondary | ICD-10-CM | POA: Diagnosis not present

## 2017-05-07 DIAGNOSIS — R55 Syncope and collapse: Secondary | ICD-10-CM | POA: Diagnosis not present

## 2017-05-08 DIAGNOSIS — M1A00X0 Idiopathic chronic gout, unspecified site, without tophus (tophi): Secondary | ICD-10-CM | POA: Diagnosis not present

## 2017-05-08 DIAGNOSIS — L03032 Cellulitis of left toe: Secondary | ICD-10-CM | POA: Diagnosis not present

## 2017-05-08 DIAGNOSIS — E119 Type 2 diabetes mellitus without complications: Secondary | ICD-10-CM | POA: Diagnosis not present

## 2017-05-08 DIAGNOSIS — B351 Tinea unguium: Secondary | ICD-10-CM | POA: Diagnosis not present

## 2017-05-23 DIAGNOSIS — E559 Vitamin D deficiency, unspecified: Secondary | ICD-10-CM | POA: Diagnosis not present

## 2017-05-23 DIAGNOSIS — I5020 Unspecified systolic (congestive) heart failure: Secondary | ICD-10-CM | POA: Diagnosis not present

## 2017-05-23 DIAGNOSIS — E119 Type 2 diabetes mellitus without complications: Secondary | ICD-10-CM | POA: Diagnosis not present

## 2017-05-23 DIAGNOSIS — E538 Deficiency of other specified B group vitamins: Secondary | ICD-10-CM | POA: Diagnosis not present

## 2017-05-23 DIAGNOSIS — I482 Chronic atrial fibrillation: Secondary | ICD-10-CM | POA: Diagnosis not present

## 2017-05-23 DIAGNOSIS — I10 Essential (primary) hypertension: Secondary | ICD-10-CM | POA: Diagnosis not present

## 2017-05-28 DIAGNOSIS — E119 Type 2 diabetes mellitus without complications: Secondary | ICD-10-CM | POA: Diagnosis not present

## 2017-05-30 DIAGNOSIS — D649 Anemia, unspecified: Secondary | ICD-10-CM | POA: Diagnosis not present

## 2017-05-30 DIAGNOSIS — I10 Essential (primary) hypertension: Secondary | ICD-10-CM | POA: Diagnosis not present

## 2017-05-30 DIAGNOSIS — I952 Hypotension due to drugs: Secondary | ICD-10-CM | POA: Diagnosis not present

## 2017-05-30 DIAGNOSIS — Z23 Encounter for immunization: Secondary | ICD-10-CM | POA: Diagnosis not present

## 2017-05-30 DIAGNOSIS — F0390 Unspecified dementia without behavioral disturbance: Secondary | ICD-10-CM | POA: Diagnosis not present

## 2017-05-30 DIAGNOSIS — I482 Chronic atrial fibrillation: Secondary | ICD-10-CM | POA: Diagnosis not present

## 2017-05-30 DIAGNOSIS — G40209 Localization-related (focal) (partial) symptomatic epilepsy and epileptic syndromes with complex partial seizures, not intractable, without status epilepticus: Secondary | ICD-10-CM | POA: Diagnosis not present

## 2017-05-30 DIAGNOSIS — I340 Nonrheumatic mitral (valve) insufficiency: Secondary | ICD-10-CM | POA: Diagnosis not present

## 2017-05-30 DIAGNOSIS — I5020 Unspecified systolic (congestive) heart failure: Secondary | ICD-10-CM | POA: Diagnosis not present

## 2017-05-30 DIAGNOSIS — E119 Type 2 diabetes mellitus without complications: Secondary | ICD-10-CM | POA: Diagnosis not present

## 2017-07-09 DIAGNOSIS — I5022 Chronic systolic (congestive) heart failure: Secondary | ICD-10-CM | POA: Diagnosis not present

## 2017-08-15 ENCOUNTER — Emergency Department
Admission: EM | Admit: 2017-08-15 | Discharge: 2017-08-15 | Disposition: A | Discharge status: Home / Self Care | Payer: Commercial Managed Care - HMO | Attending: Emergency Medicine | Admitting: Emergency Medicine

## 2017-08-15 ENCOUNTER — Encounter: Payer: Self-pay | Admitting: Emergency Medicine

## 2017-08-15 DIAGNOSIS — Z7901 Long term (current) use of anticoagulants: Secondary | ICD-10-CM | POA: Diagnosis not present

## 2017-08-15 DIAGNOSIS — I5022 Chronic systolic (congestive) heart failure: Secondary | ICD-10-CM | POA: Insufficient documentation

## 2017-08-15 DIAGNOSIS — F0390 Unspecified dementia without behavioral disturbance: Secondary | ICD-10-CM | POA: Insufficient documentation

## 2017-08-15 DIAGNOSIS — I110 Hypertensive heart disease with heart failure: Secondary | ICD-10-CM | POA: Diagnosis not present

## 2017-08-15 DIAGNOSIS — Z8673 Personal history of transient ischemic attack (TIA), and cerebral infarction without residual deficits: Secondary | ICD-10-CM | POA: Diagnosis not present

## 2017-08-15 DIAGNOSIS — E119 Type 2 diabetes mellitus without complications: Secondary | ICD-10-CM | POA: Diagnosis not present

## 2017-08-15 DIAGNOSIS — R55 Syncope and collapse: Secondary | ICD-10-CM | POA: Diagnosis not present

## 2017-08-15 DIAGNOSIS — M79661 Pain in right lower leg: Secondary | ICD-10-CM | POA: Diagnosis not present

## 2017-08-15 DIAGNOSIS — Z79899 Other long term (current) drug therapy: Secondary | ICD-10-CM | POA: Insufficient documentation

## 2017-08-15 DIAGNOSIS — M79671 Pain in right foot: Secondary | ICD-10-CM | POA: Diagnosis not present

## 2017-08-15 LAB — BASIC METABOLIC PANEL
Anion gap: 8 (ref 5–15)
BUN: 15 mg/dL (ref 6–20)
CALCIUM: 9.1 mg/dL (ref 8.9–10.3)
CO2: 28 mmol/L (ref 22–32)
CREATININE: 1 mg/dL (ref 0.44–1.00)
Chloride: 103 mmol/L (ref 101–111)
GFR calc Af Amer: 58 mL/min — ABNORMAL LOW (ref >60)
GFR, EST NON AFRICAN AMERICAN: 50 mL/min — AB (ref >60)
GLUCOSE: 85 mg/dL (ref 65–99)
Potassium: 4 mmol/L (ref 3.5–5.1)
Sodium: 139 mmol/L (ref 135–145)

## 2017-08-15 LAB — URINALYSIS, COMPLETE (UACMP) WITH MICROSCOPIC
BACTERIA UA: NONE SEEN
Bilirubin Urine: NEGATIVE
Glucose, UA: NEGATIVE mg/dL
Hgb urine dipstick: NEGATIVE
Ketones, ur: NEGATIVE mg/dL
Leukocytes, UA: NEGATIVE
Nitrite: NEGATIVE
PROTEIN: 30 mg/dL — AB
Specific Gravity, Urine: 1.01 (ref 1.005–1.030)
pH: 6 (ref 5.0–8.0)

## 2017-08-15 LAB — CBC WITH DIFFERENTIAL/PLATELET
BASOS ABS: 0.1 10*3/uL (ref 0–0.1)
Basophils Relative: 1 %
EOS PCT: 3 %
Eosinophils Absolute: 0.2 10*3/uL (ref 0–0.7)
HCT: 39.6 % (ref 35.0–47.0)
Hemoglobin: 13.2 g/dL (ref 12.0–16.0)
LYMPHS ABS: 1.3 10*3/uL (ref 1.0–3.6)
LYMPHS PCT: 19 %
MCH: 28.7 pg (ref 26.0–34.0)
MCHC: 33.4 g/dL (ref 32.0–36.0)
MCV: 85.9 fL (ref 80.0–100.0)
MONO ABS: 0.7 10*3/uL (ref 0.2–0.9)
Monocytes Relative: 10 %
Neutro Abs: 4.6 10*3/uL (ref 1.4–6.5)
Neutrophils Relative %: 67 %
PLATELETS: 196 10*3/uL (ref 150–440)
RBC: 4.6 MIL/uL (ref 3.80–5.20)
RDW: 14.6 % — AB (ref 11.5–14.5)
WBC: 7 10*3/uL (ref 3.6–11.0)

## 2017-08-15 LAB — TROPONIN I: Troponin I: <0.03 ng/mL (ref <0.03)

## 2017-08-15 NOTE — Discharge Instructions (Signed)
Please seek medical attention for any high fevers, chest pain, shortness of breath, change in behavior, persistent vomiting, bloody stool or any other new or concerning symptoms.  

## 2017-08-15 NOTE — ED Triage Notes (Signed)
Patient presents to ED via [facility] from home. Patient was on the commode this morning and "felt nervous and was shaking". Patient unable to state if she lost consciousness. Patient did not fall, did not hit her head. A&O x4.

## 2017-08-15 NOTE — ED Provider Notes (Signed)
[facility] Emergency Department Provider Note   ____________________________________________   I have reviewed the triage vital signs and the nursing notes.   HISTORY  Chief Complaint Syncope  History limited by and level 5 caveat due to: Dementia. Some history obtained from daughter.   HPI Shelley Cameron is a 83 y.o. female who presents to the emergency department today because of syncopal episode.  DURATION: ~ 5 minutes TIMING: sudden SEVERITY: complete LOC CONTEXT: patient was on the toilet when the daughter noticed she started having shaking of her left arm. She then had her eyes roll to the back of her head. Daughter was with her so she did not fall. The patient has had this happen 3-4 times in the past. Has been evaluated for it in the past without any obvious etiology identified.  MODIFYING FACTORS: none ASSOCIATED SYMPTOMS: right ankle pain - daughter states she has a history of gout.  Per medical record review patient has a history of syncope  Past Medical History:  Diagnosis Date  . A-fib ([place])   . Anemia   . Arthritis   . Cancer ([place]) 1998, 1999   breast cancer. Bilateral mastectomy.   . Diabetes mellitus without complication ([place])   . DJD (degenerative joint disease)   . Gout   . Heart murmur   . Hyperlipidemia   . Hypertension   . Lymphedema of right arm   . Moderate dementia   . Seizures ([place])    Tonic-Clonic seizure activity  . Severe mitral insufficiency   . Stroke [facility]) 2011  . Syncope   . Systolic CHF ([place])   . Urinary incontinence     Patient Active Problem List   Diagnosis Date Noted  . Sick sinus syndrome ([place]) 08/01/2016  . Chest wall pain 05/30/2016    Past Surgical History:  Procedure Laterality Date  . BREAST SURGERY Left 08/1997   Dr Smith/mastectomy  . CATARACT EXTRACTION  2000  . EYE SURGERY Bilateral    Cataract Extraction with IOL  . MASTECTOMY Right 02/19/1997   Dr Bary Castilla  . MASTECTOMY Left  1999   Dr. Rochel Brome, [facility]  . PACEMAKER INSERTION Left 08/01/2016   Procedure: INSERTION PACEMAKER;  Surgeon: Isaias Cowman, MD;  Location: [facility] ORS;  Service: Cardiovascular;  Laterality: Left;    Prior to Admission medications   Medication Sig Start Date End Date Taking? Authorizing Provider  acetaminophen (TYLENOL) 500 MG tablet Take 1,000 mg by mouth every 6 (six) hours as needed for mild pain.    [provider]  amLODipine (NORVASC) 5 MG tablet Take 5 mg by mouth at bedtime.     [provider]  azelastine (ASTELIN) 0.1 % nasal spray Place 1 spray into both nostrils 2 (two) times daily as needed for rhinitis or allergies.  07/10/16   [provider]  Cholecalciferol (VITAMIN D) 2000 units CAPS Take 2,000 Units by mouth daily.     [provider]  colchicine 0.6 MG tablet Take 0.6 mg by mouth as needed. For gout    [provider]  donepezil (ARICEPT) 5 MG tablet Take 5 mg by mouth at bedtime.    [provider]  hydrochlorothiazide (HYDRODIURIL) 12.5 MG tablet Take 12.5 mg by mouth daily.    [provider]  levETIRAcetam (KEPPRA) 500 MG tablet Take 250 mg by mouth 2 (two) times daily.    [provider]  loratadine (CLARITIN REDITABS) 10 MG dissolvable tablet Take 10 mg by mouth daily  as needed for allergies.    [provider]  lovastatin (MEVACOR) 40 MG tablet Take 40 mg by mouth at bedtime.    [provider]  Rivaroxaban (XARELTO) 15 MG TABS tablet Take 15 mg by mouth daily with supper.     [provider]  vitamin B-12 (CYANOCOBALAMIN) 1000 MCG tablet Take 1,000 mcg by mouth daily.    [provider]    Allergies Codeine  Family History  Problem Relation Age of Onset  . Hypertension Father     Social History Social History   Tobacco Use  . Smoking status: Never Smoker  . Smokeless tobacco: Never Used  Substance Use Topics  . Alcohol use: No  . Drug use:  No    Review of Systems Constitutional: No fever/chills Eyes: No visual changes. ENT: No sore throat. Cardiovascular: Denies chest pain. Respiratory: Denies shortness of breath. Gastrointestinal: No abdominal pain.  No nausea, no vomiting.  No diarrhea.   Genitourinary: Negative for dysuria. Musculoskeletal: Positive for right ankle pain. Skin: Negative for rash. Neurological: Negative for headaches, focal weakness or numbness.  ____________________________________________   PHYSICAL EXAM:  VITAL SIGNS: ED Triage Vitals  Enc Vitals Group     BP 08/15/17 1111 (!) 147/69     Pulse Rate 08/15/17 1111 69     Resp 08/15/17 1111 15     Temp 08/15/17 1111 (!) 97.3 F (36.3 C)     Temp Source 08/15/17 1111 Oral     SpO2 08/15/17 1111 95 %     Weight 08/15/17 1109 150 lb (68 kg)     Height 08/15/17 1109 5\' 5"  (1.651 m)     Head Circumference --      Peak Flow --      Pain Score 08/15/17 1108 6    Constitutional: Alert. Well appearing and in no distress. Eyes: Conjunctivae are normal.  ENT   Head: Normocephalic and atraumatic.   Nose: No congestion/rhinnorhea.   Mouth/Throat: Mucous membranes are moist.   Neck: No stridor. Hematological/Lymphatic/Immunilogical: No cervical lymphadenopathy. Cardiovascular: Normal rate, regular rhythm.  No murmurs, rubs, or gallops.  Respiratory: Normal respiratory effort without tachypnea nor retractions. Breath sounds are clear and equal bilaterally. No wheezes/rales/rhonchi. Gastrointestinal: Soft and non tender. No rebound. No guarding.  Genitourinary: Deferred Musculoskeletal: Normal range of motion in all extremities. Right ankle with some mild swelling, warmth, erythema and tenderness. Neurologic:  Normal speech and language. No gross focal neurologic deficits are appreciated.  Skin:  Skin is warm, dry and intact. No rash noted. Psychiatric: Mood and affect are normal. Speech and behavior are normal. Patient exhibits  appropriate insight and judgment.  ____________________________________________    LABS (pertinent positives/negatives)  CBC wnl except rdw 14.6 BMP wnl except gfr 58 Trop <0.03 UA not consistent with infection  ____________________________________________   EKG  None  ____________________________________________    RADIOLOGY  None  ____________________________________________   PROCEDURES  Procedures  ____________________________________________   INITIAL IMPRESSION / ASSESSMENT AND PLAN / ED COURSE  Pertinent labs & imaging results that were available during my care of the patient were reviewed by me and considered in my medical decision making (see chart for details).  Patient presents to the emergency department today after a syncopal episode.  Patient does have a history of syncopal episodes in the past and has been evaluated with negative workups.  On exam patient is at her baseline.  She does have some right ankle swelling warmth and erythema consistent with history of gout.  Blood work and urine today without concerning findings.  I discussed with daughter the importance of primary care follow-up.  At this point I think vasovagal syncope likely. Discussed return precautions with daughter.  ____________________________________________   FINAL CLINICAL IMPRESSION(S) / ED DIAGNOSES  Final diagnoses:  Syncope and collapse     Note: This dictation was prepared with Dragon dictation. Any transcriptional errors that result from this process are unintentional     Nance Pear, MD 08/15/17 1335

## 2017-08-15 NOTE — ED Notes (Signed)
EMS report [id number].

## 2017-08-23 DIAGNOSIS — I482 Chronic atrial fibrillation: Secondary | ICD-10-CM | POA: Diagnosis not present

## 2017-08-23 DIAGNOSIS — I5020 Unspecified systolic (congestive) heart failure: Secondary | ICD-10-CM | POA: Diagnosis not present

## 2017-08-23 DIAGNOSIS — I10 Essential (primary) hypertension: Secondary | ICD-10-CM | POA: Diagnosis not present

## 2017-08-23 DIAGNOSIS — G40209 Localization-related (focal) (partial) symptomatic epilepsy and epileptic syndromes with complex partial seizures, not intractable, without status epilepticus: Secondary | ICD-10-CM | POA: Diagnosis not present

## 2017-09-02 DIAGNOSIS — E119 Type 2 diabetes mellitus without complications: Secondary | ICD-10-CM | POA: Diagnosis not present

## 2017-09-02 DIAGNOSIS — M79674 Pain in right toe(s): Secondary | ICD-10-CM | POA: Diagnosis not present

## 2017-09-02 DIAGNOSIS — M79675 Pain in left toe(s): Secondary | ICD-10-CM | POA: Diagnosis not present

## 2017-09-02 DIAGNOSIS — B351 Tinea unguium: Secondary | ICD-10-CM | POA: Diagnosis not present

## 2017-09-04 DIAGNOSIS — F0391 Unspecified dementia with behavioral disturbance: Secondary | ICD-10-CM | POA: Diagnosis not present

## 2017-09-04 DIAGNOSIS — R569 Unspecified convulsions: Secondary | ICD-10-CM | POA: Diagnosis not present

## 2017-09-30 DIAGNOSIS — L821 Other seborrheic keratosis: Secondary | ICD-10-CM | POA: Diagnosis not present

## 2017-10-16 DIAGNOSIS — R569 Unspecified convulsions: Secondary | ICD-10-CM | POA: Diagnosis not present

## 2017-10-16 DIAGNOSIS — F0391 Unspecified dementia with behavioral disturbance: Secondary | ICD-10-CM | POA: Diagnosis not present

## 2017-10-21 DIAGNOSIS — M79672 Pain in left foot: Secondary | ICD-10-CM | POA: Diagnosis not present

## 2017-10-21 DIAGNOSIS — M10072 Idiopathic gout, left ankle and foot: Secondary | ICD-10-CM | POA: Diagnosis not present

## 2017-10-24 DIAGNOSIS — R41841 Cognitive communication deficit: Secondary | ICD-10-CM | POA: Diagnosis not present

## 2017-10-24 DIAGNOSIS — F0391 Unspecified dementia with behavioral disturbance: Secondary | ICD-10-CM | POA: Diagnosis not present

## 2017-10-24 DIAGNOSIS — R413 Other amnesia: Secondary | ICD-10-CM | POA: Diagnosis not present

## 2017-10-24 DIAGNOSIS — D519 Vitamin B12 deficiency anemia, unspecified: Secondary | ICD-10-CM | POA: Diagnosis not present

## 2017-10-29 DIAGNOSIS — R41841 Cognitive communication deficit: Secondary | ICD-10-CM | POA: Diagnosis not present

## 2017-10-29 DIAGNOSIS — F0391 Unspecified dementia with behavioral disturbance: Secondary | ICD-10-CM | POA: Diagnosis not present

## 2017-10-29 DIAGNOSIS — I110 Hypertensive heart disease with heart failure: Secondary | ICD-10-CM | POA: Diagnosis not present

## 2017-10-29 DIAGNOSIS — I509 Heart failure, unspecified: Secondary | ICD-10-CM | POA: Diagnosis not present

## 2017-10-29 DIAGNOSIS — G40909 Epilepsy, unspecified, not intractable, without status epilepticus: Secondary | ICD-10-CM | POA: Diagnosis not present

## 2017-10-29 DIAGNOSIS — I4891 Unspecified atrial fibrillation: Secondary | ICD-10-CM | POA: Diagnosis not present

## 2017-10-29 DIAGNOSIS — Z9013 Acquired absence of bilateral breasts and nipples: Secondary | ICD-10-CM | POA: Diagnosis not present

## 2017-10-29 DIAGNOSIS — Z853 Personal history of malignant neoplasm of breast: Secondary | ICD-10-CM | POA: Diagnosis not present

## 2017-10-29 DIAGNOSIS — D519 Vitamin B12 deficiency anemia, unspecified: Secondary | ICD-10-CM | POA: Diagnosis not present

## 2017-10-30 ENCOUNTER — Encounter: Payer: Self-pay | Admitting: Emergency Medicine

## 2017-10-30 ENCOUNTER — Other Ambulatory Visit: Payer: Self-pay

## 2017-10-30 DIAGNOSIS — Z950 Presence of cardiac pacemaker: Secondary | ICD-10-CM | POA: Insufficient documentation

## 2017-10-30 DIAGNOSIS — Z79899 Other long term (current) drug therapy: Secondary | ICD-10-CM | POA: Diagnosis not present

## 2017-10-30 DIAGNOSIS — E1152 Type 2 diabetes mellitus with diabetic peripheral angiopathy with gangrene: Secondary | ICD-10-CM | POA: Insufficient documentation

## 2017-10-30 DIAGNOSIS — I110 Hypertensive heart disease with heart failure: Secondary | ICD-10-CM | POA: Diagnosis not present

## 2017-10-30 DIAGNOSIS — M79651 Pain in right thigh: Secondary | ICD-10-CM | POA: Diagnosis not present

## 2017-10-30 DIAGNOSIS — I5020 Unspecified systolic (congestive) heart failure: Secondary | ICD-10-CM | POA: Insufficient documentation

## 2017-10-30 DIAGNOSIS — F0390 Unspecified dementia without behavioral disturbance: Secondary | ICD-10-CM | POA: Insufficient documentation

## 2017-10-30 DIAGNOSIS — Z853 Personal history of malignant neoplasm of breast: Secondary | ICD-10-CM | POA: Diagnosis not present

## 2017-10-30 DIAGNOSIS — Z7901 Long term (current) use of anticoagulants: Secondary | ICD-10-CM | POA: Insufficient documentation

## 2017-10-30 DIAGNOSIS — Z8673 Personal history of transient ischemic attack (TIA), and cerebral infarction without residual deficits: Secondary | ICD-10-CM | POA: Insufficient documentation

## 2017-10-30 DIAGNOSIS — M79604 Pain in right leg: Secondary | ICD-10-CM | POA: Diagnosis not present

## 2017-10-30 NOTE — ED Triage Notes (Signed)
First nurse note: Patient has h/o dementia. Patient's daughter states patient woke up complaining of right leg pain ("from hip down to knee"). Denies any known injury.

## 2017-10-31 ENCOUNTER — Emergency Department: Payer: Medicare HMO

## 2017-10-31 ENCOUNTER — Emergency Department
Admission: EM | Admit: 2017-10-31 | Discharge: 2017-10-31 | Disposition: A | Discharge status: Home / Self Care | Payer: Medicare HMO | Attending: Emergency Medicine | Admitting: Emergency Medicine

## 2017-10-31 DIAGNOSIS — M79604 Pain in right leg: Secondary | ICD-10-CM

## 2017-10-31 NOTE — ED Provider Notes (Signed)
[facility] Emergency Department Provider Note    First MD Initiated Contact with Patient 10/31/17 [phone]     (approximate)  I have reviewed the triage vital signs and the nursing notes.  Level 5 caveat: History limited secondary to dementia HISTORY  Chief Complaint Leg Pain    HPI Shelley Cameron is a 84 y.o. female with below list of chronic medical conditions including dementia presents to the emergency department with her daughter.  Patient's daughter states that her mother awoke tonight with complaint of right leg pain from the hip to the knee.  Patient's daughter states that there was no trauma patient did not fall.  Patient denies any pain at present   Past Medical History:  Diagnosis Date  . A-fib ([place])   . Anemia   . Arthritis   . Cancer ([place]) 1998, 1999   breast cancer. Bilateral mastectomy.   . Diabetes mellitus without complication ([place])   . DJD (degenerative joint disease)   . Gout   . Heart murmur   . Hyperlipidemia   . Hypertension   . Lymphedema of right arm   . Moderate dementia   . Seizures ([place])    Tonic-Clonic seizure activity  . Severe mitral insufficiency   . Stroke [facility]) 2011  . Syncope   . Systolic CHF ([place])   . Urinary incontinence     Patient Active Problem List   Diagnosis Date Noted  . Sick sinus syndrome ([place]) 08/01/2016  . Chest wall pain 05/30/2016    Past Surgical History:  Procedure Laterality Date  . BREAST SURGERY Left 08/1997   Dr Smith/mastectomy  . CATARACT EXTRACTION  2000  . EYE SURGERY Bilateral    Cataract Extraction with IOL  . MASTECTOMY Right 02/19/1997   Dr Bary Castilla  . MASTECTOMY Left 1999   Dr. Rochel Brome, [facility]  . PACEMAKER INSERTION Left 08/01/2016   Procedure: INSERTION PACEMAKER;  Surgeon: Isaias Cowman, MD;  Location: [facility] ORS;  Service: Cardiovascular;  Laterality: Left;    Prior to Admission medications   Medication Sig Start Date End Date Taking? Authorizing Provider    acetaminophen (TYLENOL) 500 MG tablet Take 1,000 mg by mouth every 6 (six) hours as needed for mild pain.    [provider]  amLODipine (NORVASC) 5 MG tablet Take 5 mg by mouth at bedtime.     [provider]  azelastine (ASTELIN) 0.1 % nasal spray Place 1 spray into both nostrils 2 (two) times daily as needed for rhinitis or allergies.  07/10/16   [provider]  Cholecalciferol (VITAMIN D) 2000 units CAPS Take 2,000 Units by mouth daily.     [provider]  colchicine 0.6 MG tablet Take 0.6 mg by mouth as needed. For gout    [provider]  donepezil (ARICEPT) 5 MG tablet Take 5 mg by mouth at bedtime.    [provider]  hydrochlorothiazide (HYDRODIURIL) 12.5 MG tablet Take 12.5 mg by mouth daily.    [provider]  levETIRAcetam (KEPPRA) 500 MG tablet Take 250 mg by mouth 2 (two) times daily.    [provider]  loratadine (CLARITIN REDITABS) 10 MG dissolvable tablet Take 10 mg by mouth daily as needed for allergies.    [provider]  lovastatin (MEVACOR) 40 MG tablet Take 40 mg by mouth at bedtime.    [provider]  Rivaroxaban (XARELTO) 15 MG TABS tablet Take 15 mg by mouth daily with supper.  [provider]  vitamin B-12 (CYANOCOBALAMIN) 1000 MCG tablet Take 1,000 mcg by mouth daily.    [provider]    Allergies Codeine  Family History  Problem Relation Age of Onset  . Hypertension Father     Social History Social History   Tobacco Use  . Smoking status: Never Smoker  . Smokeless tobacco: Never Used  Substance Use Topics  . Alcohol use: No  . Drug use: No    Review of Systems Constitutional: No fever/chills Eyes: No visual changes. ENT: No sore throat. Cardiovascular: Denies chest pain. Respiratory: Denies shortness of breath. Gastrointestinal: No abdominal pain.  No nausea, no vomiting.  No diarrhea.  No constipation. Genitourinary: Negative for  dysuria. Musculoskeletal: Negative for neck pain.  Negative for back pain.  Reported right leg pain Integumentary: Negative for rash. Neurological: Negative for headaches, focal weakness or numbness.   ____________________________________________   PHYSICAL EXAM:  VITAL SIGNS: ED Triage Vitals  Enc Vitals Group     BP 10/30/17 2323 (!) 181/125     Pulse Rate 10/30/17 2323 84     Resp 10/30/17 2323 20     Temp 10/30/17 2323 98.5 F (36.9 C)     Temp Source 10/30/17 2323 Oral     SpO2 10/30/17 2323 95 %     Weight 10/30/17 2321 67.6 kg (149 lb)     Height 10/30/17 2321 1.6 m (5\' 3" )     Head Circumference --      Peak Flow --      Pain Score --      Pain Loc --      Pain Edu? --      Excl. in [place]? --     Constitutional: Alert and Well appearing and in no acute distress. Eyes: Conjunctivae are normal.  Head: Atraumatic. Mouth/Throat: Mucous membranes are moist. Oropharynx non-erythematous. Neck: No stridor.   Cardiovascular: Normal rate, regular rhythm. Good peripheral circulation. Grossly normal heart sounds.  Palpable bilateral lower extremity PT and DP pulses equal Respiratory: Normal respiratory effort.  No retractions. Lungs CTAB. Gastrointestinal: Soft and nontender. No distention.  Musculoskeletal: No lower extremity tenderness nor edema. No gross deformities of extremities. Neurologic:  Normal speech and language. No gross focal neurologic deficits are appreciated.  Skin:  Skin is warm, dry and intact. No rash noted.    RADIOLOGY I, Sc[place], personally viewed and evaluated these images (plain radiographs) as part of my medical decision making, as well as reviewing the written report by the radiologist.    Official radiology report(s): US Venous Img Lower Unilateral Right  Result Date: 10/31/2017 CLINICAL DATA:  Right leg pain. EXAM: RIGHT LOWER EXTREMITY VENOUS DOPPLER ULTRASOUND TECHNIQUE: Gray-scale sonography with graded compression, as well as  color Doppler and duplex ultrasound were performed to evaluate the lower extremity deep venous systems from the level of the common femoral vein and including the common femoral, femoral, profunda femoral, popliteal and calf veins including the posterior tibial, peroneal and gastrocnemius veins when visible. The superficial great saphenous vein was also interrogated. Spectral Doppler was utilized to evaluate flow at rest and with distal augmentation maneuvers in the common femoral, femoral and popliteal veins. COMPARISON:  01/12/2016 FINDINGS: Contralateral Common Femoral Vein: Respiratory phasicity is normal and symmetric with the symptomatic side. No evidence of thrombus. Normal compressibility. Common Femoral Vein: No evidence of thrombus. Normal compressibility, respiratory phasicity and response to augmentation. Saphenofemoral Junction: No evidence of thrombus. Normal compressibility and flow on color Doppler  imaging. Profunda Femoral Vein: No evidence of thrombus. Normal compressibility and flow on color Doppler imaging. Femoral Vein: No evidence of thrombus. Normal compressibility, respiratory phasicity and response to augmentation. Popliteal Vein: No evidence of thrombus. Normal compressibility, respiratory phasicity and response to augmentation. Calf Veins: No evidence of thrombus. Normal compressibility and flow on color Doppler imaging. Superficial Great Saphenous Vein: No evidence of thrombus. Normal compressibility. Venous Reflux:  None. Other Findings:  None. IMPRESSION: No evidence of right lower extremity deep venous thrombosis. Electronically Signed   By: Jeb Levering M.D.   On: 10/31/2017 02:32    ____________________________________________   Procedures   ____________________________________________   INITIAL IMPRESSION / ASSESSMENT AND PLAN / ED COURSE  As part of my medical decision making, I reviewed the following data within the electronic MEDICAL RECORD NUMBER   84 year old  female presenting with reported history of nontraumatic right leg pain however no pain at present.  Concern for possible arterial occlusion however patient has palpable bilateral PT DP pulses with no overlying skin changes and no change in temperature.  Consider to possibly of DVT however ultrasound revealed no evidence of DVT.    ____________________________________________  FINAL CLINICAL IMPRESSION(S) / ED DIAGNOSES  Final diagnoses:  Right leg pain     MEDICATIONS GIVEN DURING THIS VISIT:  Medications - No data to display   ED Discharge Orders    None       Note:  This document was prepared using Dragon voice recognition software and may include unintentional dictation errors.    Gregor Hams, MD 10/31/17 [phone]

## 2017-11-04 DIAGNOSIS — I4891 Unspecified atrial fibrillation: Secondary | ICD-10-CM | POA: Diagnosis not present

## 2017-11-04 DIAGNOSIS — I509 Heart failure, unspecified: Secondary | ICD-10-CM | POA: Diagnosis not present

## 2017-11-04 DIAGNOSIS — F0391 Unspecified dementia with behavioral disturbance: Secondary | ICD-10-CM | POA: Diagnosis not present

## 2017-11-04 DIAGNOSIS — R41841 Cognitive communication deficit: Secondary | ICD-10-CM | POA: Diagnosis not present

## 2017-11-04 DIAGNOSIS — Z9013 Acquired absence of bilateral breasts and nipples: Secondary | ICD-10-CM | POA: Diagnosis not present

## 2017-11-04 DIAGNOSIS — I110 Hypertensive heart disease with heart failure: Secondary | ICD-10-CM | POA: Diagnosis not present

## 2017-11-04 DIAGNOSIS — Z853 Personal history of malignant neoplasm of breast: Secondary | ICD-10-CM | POA: Diagnosis not present

## 2017-11-04 DIAGNOSIS — G40909 Epilepsy, unspecified, not intractable, without status epilepticus: Secondary | ICD-10-CM | POA: Diagnosis not present

## 2017-11-04 DIAGNOSIS — D519 Vitamin B12 deficiency anemia, unspecified: Secondary | ICD-10-CM | POA: Diagnosis not present

## 2017-11-08 DIAGNOSIS — Z9013 Acquired absence of bilateral breasts and nipples: Secondary | ICD-10-CM | POA: Diagnosis not present

## 2017-11-08 DIAGNOSIS — Z853 Personal history of malignant neoplasm of breast: Secondary | ICD-10-CM | POA: Diagnosis not present

## 2017-11-08 DIAGNOSIS — I4891 Unspecified atrial fibrillation: Secondary | ICD-10-CM | POA: Diagnosis not present

## 2017-11-08 DIAGNOSIS — I110 Hypertensive heart disease with heart failure: Secondary | ICD-10-CM | POA: Diagnosis not present

## 2017-11-08 DIAGNOSIS — R41841 Cognitive communication deficit: Secondary | ICD-10-CM | POA: Diagnosis not present

## 2017-11-08 DIAGNOSIS — F0391 Unspecified dementia with behavioral disturbance: Secondary | ICD-10-CM | POA: Diagnosis not present

## 2017-11-08 DIAGNOSIS — G40909 Epilepsy, unspecified, not intractable, without status epilepticus: Secondary | ICD-10-CM | POA: Diagnosis not present

## 2017-11-08 DIAGNOSIS — D519 Vitamin B12 deficiency anemia, unspecified: Secondary | ICD-10-CM | POA: Diagnosis not present

## 2017-11-08 DIAGNOSIS — I509 Heart failure, unspecified: Secondary | ICD-10-CM | POA: Diagnosis not present

## 2017-11-13 DIAGNOSIS — G40909 Epilepsy, unspecified, not intractable, without status epilepticus: Secondary | ICD-10-CM | POA: Diagnosis not present

## 2017-11-13 DIAGNOSIS — D519 Vitamin B12 deficiency anemia, unspecified: Secondary | ICD-10-CM | POA: Diagnosis not present

## 2017-11-13 DIAGNOSIS — I110 Hypertensive heart disease with heart failure: Secondary | ICD-10-CM | POA: Diagnosis not present

## 2017-11-13 DIAGNOSIS — Z853 Personal history of malignant neoplasm of breast: Secondary | ICD-10-CM | POA: Diagnosis not present

## 2017-11-13 DIAGNOSIS — I509 Heart failure, unspecified: Secondary | ICD-10-CM | POA: Diagnosis not present

## 2017-11-13 DIAGNOSIS — F0391 Unspecified dementia with behavioral disturbance: Secondary | ICD-10-CM | POA: Diagnosis not present

## 2017-11-13 DIAGNOSIS — Z9013 Acquired absence of bilateral breasts and nipples: Secondary | ICD-10-CM | POA: Diagnosis not present

## 2017-11-13 DIAGNOSIS — I4891 Unspecified atrial fibrillation: Secondary | ICD-10-CM | POA: Diagnosis not present

## 2017-11-13 DIAGNOSIS — R41841 Cognitive communication deficit: Secondary | ICD-10-CM | POA: Diagnosis not present

## 2017-11-15 DIAGNOSIS — I340 Nonrheumatic mitral (valve) insufficiency: Secondary | ICD-10-CM | POA: Diagnosis not present

## 2017-11-15 DIAGNOSIS — F0390 Unspecified dementia without behavioral disturbance: Secondary | ICD-10-CM | POA: Diagnosis not present

## 2017-11-15 DIAGNOSIS — F0391 Unspecified dementia with behavioral disturbance: Secondary | ICD-10-CM | POA: Diagnosis not present

## 2017-11-15 DIAGNOSIS — R1033 Periumbilical pain: Secondary | ICD-10-CM | POA: Diagnosis not present

## 2017-11-15 DIAGNOSIS — I10 Essential (primary) hypertension: Secondary | ICD-10-CM | POA: Diagnosis not present

## 2017-11-15 DIAGNOSIS — R109 Unspecified abdominal pain: Secondary | ICD-10-CM | POA: Diagnosis not present

## 2017-11-15 DIAGNOSIS — I482 Chronic atrial fibrillation: Secondary | ICD-10-CM | POA: Diagnosis not present

## 2017-11-15 DIAGNOSIS — G40209 Localization-related (focal) (partial) symptomatic epilepsy and epileptic syndromes with complex partial seizures, not intractable, without status epilepticus: Secondary | ICD-10-CM | POA: Diagnosis not present

## 2017-11-15 DIAGNOSIS — I5020 Unspecified systolic (congestive) heart failure: Secondary | ICD-10-CM | POA: Diagnosis not present

## 2017-11-15 DIAGNOSIS — E119 Type 2 diabetes mellitus without complications: Secondary | ICD-10-CM | POA: Diagnosis not present

## 2017-11-18 DIAGNOSIS — I509 Heart failure, unspecified: Secondary | ICD-10-CM | POA: Diagnosis not present

## 2017-11-18 DIAGNOSIS — R41841 Cognitive communication deficit: Secondary | ICD-10-CM | POA: Diagnosis not present

## 2017-11-18 DIAGNOSIS — I4891 Unspecified atrial fibrillation: Secondary | ICD-10-CM | POA: Diagnosis not present

## 2017-11-18 DIAGNOSIS — G40909 Epilepsy, unspecified, not intractable, without status epilepticus: Secondary | ICD-10-CM | POA: Diagnosis not present

## 2017-11-18 DIAGNOSIS — I110 Hypertensive heart disease with heart failure: Secondary | ICD-10-CM | POA: Diagnosis not present

## 2017-11-18 DIAGNOSIS — Z853 Personal history of malignant neoplasm of breast: Secondary | ICD-10-CM | POA: Diagnosis not present

## 2017-11-18 DIAGNOSIS — D519 Vitamin B12 deficiency anemia, unspecified: Secondary | ICD-10-CM | POA: Diagnosis not present

## 2017-11-18 DIAGNOSIS — Z9013 Acquired absence of bilateral breasts and nipples: Secondary | ICD-10-CM | POA: Diagnosis not present

## 2017-11-18 DIAGNOSIS — F0391 Unspecified dementia with behavioral disturbance: Secondary | ICD-10-CM | POA: Diagnosis not present

## 2017-11-18 DIAGNOSIS — R109 Unspecified abdominal pain: Secondary | ICD-10-CM | POA: Diagnosis not present

## 2017-11-21 DIAGNOSIS — G40909 Epilepsy, unspecified, not intractable, without status epilepticus: Secondary | ICD-10-CM | POA: Diagnosis not present

## 2017-11-21 DIAGNOSIS — I509 Heart failure, unspecified: Secondary | ICD-10-CM | POA: Diagnosis not present

## 2017-11-21 DIAGNOSIS — I4891 Unspecified atrial fibrillation: Secondary | ICD-10-CM | POA: Diagnosis not present

## 2017-11-21 DIAGNOSIS — D519 Vitamin B12 deficiency anemia, unspecified: Secondary | ICD-10-CM | POA: Diagnosis not present

## 2017-11-21 DIAGNOSIS — Z9013 Acquired absence of bilateral breasts and nipples: Secondary | ICD-10-CM | POA: Diagnosis not present

## 2017-11-21 DIAGNOSIS — Z853 Personal history of malignant neoplasm of breast: Secondary | ICD-10-CM | POA: Diagnosis not present

## 2017-11-21 DIAGNOSIS — R41841 Cognitive communication deficit: Secondary | ICD-10-CM | POA: Diagnosis not present

## 2017-11-21 DIAGNOSIS — I110 Hypertensive heart disease with heart failure: Secondary | ICD-10-CM | POA: Diagnosis not present

## 2017-11-21 DIAGNOSIS — F0391 Unspecified dementia with behavioral disturbance: Secondary | ICD-10-CM | POA: Diagnosis not present

## 2017-11-22 DIAGNOSIS — E119 Type 2 diabetes mellitus without complications: Secondary | ICD-10-CM | POA: Diagnosis not present

## 2017-11-22 DIAGNOSIS — E782 Mixed hyperlipidemia: Secondary | ICD-10-CM | POA: Diagnosis not present

## 2017-11-22 DIAGNOSIS — I10 Essential (primary) hypertension: Secondary | ICD-10-CM | POA: Diagnosis not present

## 2017-11-22 DIAGNOSIS — D649 Anemia, unspecified: Secondary | ICD-10-CM | POA: Diagnosis not present

## 2017-11-26 DIAGNOSIS — R55 Syncope and collapse: Secondary | ICD-10-CM | POA: Diagnosis not present

## 2017-11-26 DIAGNOSIS — I340 Nonrheumatic mitral (valve) insufficiency: Secondary | ICD-10-CM | POA: Diagnosis not present

## 2017-11-26 DIAGNOSIS — I5020 Unspecified systolic (congestive) heart failure: Secondary | ICD-10-CM | POA: Diagnosis not present

## 2017-11-26 DIAGNOSIS — I482 Chronic atrial fibrillation: Secondary | ICD-10-CM | POA: Diagnosis not present

## 2017-11-27 DIAGNOSIS — I110 Hypertensive heart disease with heart failure: Secondary | ICD-10-CM | POA: Diagnosis not present

## 2017-11-27 DIAGNOSIS — G40909 Epilepsy, unspecified, not intractable, without status epilepticus: Secondary | ICD-10-CM | POA: Diagnosis not present

## 2017-11-27 DIAGNOSIS — Z9013 Acquired absence of bilateral breasts and nipples: Secondary | ICD-10-CM | POA: Diagnosis not present

## 2017-11-27 DIAGNOSIS — I509 Heart failure, unspecified: Secondary | ICD-10-CM | POA: Diagnosis not present

## 2017-11-27 DIAGNOSIS — R41841 Cognitive communication deficit: Secondary | ICD-10-CM | POA: Diagnosis not present

## 2017-11-27 DIAGNOSIS — D519 Vitamin B12 deficiency anemia, unspecified: Secondary | ICD-10-CM | POA: Diagnosis not present

## 2017-11-27 DIAGNOSIS — Z853 Personal history of malignant neoplasm of breast: Secondary | ICD-10-CM | POA: Diagnosis not present

## 2017-11-27 DIAGNOSIS — F0391 Unspecified dementia with behavioral disturbance: Secondary | ICD-10-CM | POA: Diagnosis not present

## 2017-11-27 DIAGNOSIS — I4891 Unspecified atrial fibrillation: Secondary | ICD-10-CM | POA: Diagnosis not present

## 2017-11-28 DIAGNOSIS — N909 Noninflammatory disorder of vulva and perineum, unspecified: Secondary | ICD-10-CM | POA: Diagnosis not present

## 2017-11-28 DIAGNOSIS — L910 Hypertrophic scar: Secondary | ICD-10-CM | POA: Diagnosis not present

## 2017-11-29 DIAGNOSIS — F0390 Unspecified dementia without behavioral disturbance: Secondary | ICD-10-CM | POA: Diagnosis not present

## 2017-11-29 DIAGNOSIS — I482 Chronic atrial fibrillation: Secondary | ICD-10-CM | POA: Diagnosis not present

## 2017-11-29 DIAGNOSIS — Z0000 Encounter for general adult medical examination without abnormal findings: Secondary | ICD-10-CM | POA: Diagnosis not present

## 2017-11-29 DIAGNOSIS — D649 Anemia, unspecified: Secondary | ICD-10-CM | POA: Diagnosis not present

## 2017-11-29 DIAGNOSIS — M1990 Unspecified osteoarthritis, unspecified site: Secondary | ICD-10-CM | POA: Diagnosis not present

## 2017-11-29 DIAGNOSIS — G40209 Localization-related (focal) (partial) symptomatic epilepsy and epileptic syndromes with complex partial seizures, not intractable, without status epilepticus: Secondary | ICD-10-CM | POA: Diagnosis not present

## 2017-11-29 DIAGNOSIS — I5020 Unspecified systolic (congestive) heart failure: Secondary | ICD-10-CM | POA: Diagnosis not present

## 2017-11-29 DIAGNOSIS — E119 Type 2 diabetes mellitus without complications: Secondary | ICD-10-CM | POA: Diagnosis not present

## 2017-11-29 DIAGNOSIS — F0391 Unspecified dementia with behavioral disturbance: Secondary | ICD-10-CM | POA: Diagnosis not present

## 2017-11-29 DIAGNOSIS — I340 Nonrheumatic mitral (valve) insufficiency: Secondary | ICD-10-CM | POA: Diagnosis not present

## 2017-12-03 DIAGNOSIS — G40909 Epilepsy, unspecified, not intractable, without status epilepticus: Secondary | ICD-10-CM | POA: Diagnosis not present

## 2017-12-03 DIAGNOSIS — R41841 Cognitive communication deficit: Secondary | ICD-10-CM | POA: Diagnosis not present

## 2017-12-03 DIAGNOSIS — I509 Heart failure, unspecified: Secondary | ICD-10-CM | POA: Diagnosis not present

## 2017-12-03 DIAGNOSIS — I4891 Unspecified atrial fibrillation: Secondary | ICD-10-CM | POA: Diagnosis not present

## 2017-12-03 DIAGNOSIS — Z853 Personal history of malignant neoplasm of breast: Secondary | ICD-10-CM | POA: Diagnosis not present

## 2017-12-03 DIAGNOSIS — I110 Hypertensive heart disease with heart failure: Secondary | ICD-10-CM | POA: Diagnosis not present

## 2017-12-03 DIAGNOSIS — D519 Vitamin B12 deficiency anemia, unspecified: Secondary | ICD-10-CM | POA: Diagnosis not present

## 2017-12-03 DIAGNOSIS — F0391 Unspecified dementia with behavioral disturbance: Secondary | ICD-10-CM | POA: Diagnosis not present

## 2017-12-03 DIAGNOSIS — Z9013 Acquired absence of bilateral breasts and nipples: Secondary | ICD-10-CM | POA: Diagnosis not present

## 2017-12-05 DIAGNOSIS — D519 Vitamin B12 deficiency anemia, unspecified: Secondary | ICD-10-CM | POA: Diagnosis not present

## 2017-12-05 DIAGNOSIS — Z853 Personal history of malignant neoplasm of breast: Secondary | ICD-10-CM | POA: Diagnosis not present

## 2017-12-05 DIAGNOSIS — G40909 Epilepsy, unspecified, not intractable, without status epilepticus: Secondary | ICD-10-CM | POA: Diagnosis not present

## 2017-12-05 DIAGNOSIS — I4891 Unspecified atrial fibrillation: Secondary | ICD-10-CM | POA: Diagnosis not present

## 2017-12-05 DIAGNOSIS — R41841 Cognitive communication deficit: Secondary | ICD-10-CM | POA: Diagnosis not present

## 2017-12-05 DIAGNOSIS — F0391 Unspecified dementia with behavioral disturbance: Secondary | ICD-10-CM | POA: Diagnosis not present

## 2017-12-05 DIAGNOSIS — Z9013 Acquired absence of bilateral breasts and nipples: Secondary | ICD-10-CM | POA: Diagnosis not present

## 2017-12-05 DIAGNOSIS — I110 Hypertensive heart disease with heart failure: Secondary | ICD-10-CM | POA: Diagnosis not present

## 2017-12-05 DIAGNOSIS — I509 Heart failure, unspecified: Secondary | ICD-10-CM | POA: Diagnosis not present

## 2017-12-06 DIAGNOSIS — R41841 Cognitive communication deficit: Secondary | ICD-10-CM | POA: Diagnosis not present

## 2017-12-06 DIAGNOSIS — D519 Vitamin B12 deficiency anemia, unspecified: Secondary | ICD-10-CM | POA: Diagnosis not present

## 2017-12-06 DIAGNOSIS — G40909 Epilepsy, unspecified, not intractable, without status epilepticus: Secondary | ICD-10-CM | POA: Diagnosis not present

## 2017-12-06 DIAGNOSIS — Z853 Personal history of malignant neoplasm of breast: Secondary | ICD-10-CM | POA: Diagnosis not present

## 2017-12-06 DIAGNOSIS — Z9013 Acquired absence of bilateral breasts and nipples: Secondary | ICD-10-CM | POA: Diagnosis not present

## 2017-12-06 DIAGNOSIS — I110 Hypertensive heart disease with heart failure: Secondary | ICD-10-CM | POA: Diagnosis not present

## 2017-12-06 DIAGNOSIS — I509 Heart failure, unspecified: Secondary | ICD-10-CM | POA: Diagnosis not present

## 2017-12-06 DIAGNOSIS — F0391 Unspecified dementia with behavioral disturbance: Secondary | ICD-10-CM | POA: Diagnosis not present

## 2017-12-06 DIAGNOSIS — I4891 Unspecified atrial fibrillation: Secondary | ICD-10-CM | POA: Diagnosis not present

## 2017-12-11 DIAGNOSIS — D519 Vitamin B12 deficiency anemia, unspecified: Secondary | ICD-10-CM | POA: Diagnosis not present

## 2017-12-11 DIAGNOSIS — I110 Hypertensive heart disease with heart failure: Secondary | ICD-10-CM | POA: Diagnosis not present

## 2017-12-11 DIAGNOSIS — F0391 Unspecified dementia with behavioral disturbance: Secondary | ICD-10-CM | POA: Diagnosis not present

## 2017-12-11 DIAGNOSIS — I4891 Unspecified atrial fibrillation: Secondary | ICD-10-CM | POA: Diagnosis not present

## 2017-12-11 DIAGNOSIS — I509 Heart failure, unspecified: Secondary | ICD-10-CM | POA: Diagnosis not present

## 2017-12-11 DIAGNOSIS — G40909 Epilepsy, unspecified, not intractable, without status epilepticus: Secondary | ICD-10-CM | POA: Diagnosis not present

## 2017-12-11 DIAGNOSIS — R41841 Cognitive communication deficit: Secondary | ICD-10-CM | POA: Diagnosis not present

## 2017-12-11 DIAGNOSIS — Z853 Personal history of malignant neoplasm of breast: Secondary | ICD-10-CM | POA: Diagnosis not present

## 2017-12-11 DIAGNOSIS — Z9013 Acquired absence of bilateral breasts and nipples: Secondary | ICD-10-CM | POA: Diagnosis not present

## 2017-12-12 DIAGNOSIS — F0391 Unspecified dementia with behavioral disturbance: Secondary | ICD-10-CM | POA: Diagnosis not present

## 2017-12-12 DIAGNOSIS — I110 Hypertensive heart disease with heart failure: Secondary | ICD-10-CM | POA: Diagnosis not present

## 2017-12-12 DIAGNOSIS — I4891 Unspecified atrial fibrillation: Secondary | ICD-10-CM | POA: Diagnosis not present

## 2017-12-12 DIAGNOSIS — G40909 Epilepsy, unspecified, not intractable, without status epilepticus: Secondary | ICD-10-CM | POA: Diagnosis not present

## 2017-12-12 DIAGNOSIS — Z9013 Acquired absence of bilateral breasts and nipples: Secondary | ICD-10-CM | POA: Diagnosis not present

## 2017-12-12 DIAGNOSIS — R41841 Cognitive communication deficit: Secondary | ICD-10-CM | POA: Diagnosis not present

## 2017-12-12 DIAGNOSIS — I509 Heart failure, unspecified: Secondary | ICD-10-CM | POA: Diagnosis not present

## 2017-12-12 DIAGNOSIS — D519 Vitamin B12 deficiency anemia, unspecified: Secondary | ICD-10-CM | POA: Diagnosis not present

## 2017-12-12 DIAGNOSIS — Z853 Personal history of malignant neoplasm of breast: Secondary | ICD-10-CM | POA: Diagnosis not present

## 2017-12-17 DIAGNOSIS — I4891 Unspecified atrial fibrillation: Secondary | ICD-10-CM | POA: Diagnosis not present

## 2017-12-17 DIAGNOSIS — F0391 Unspecified dementia with behavioral disturbance: Secondary | ICD-10-CM | POA: Diagnosis not present

## 2017-12-17 DIAGNOSIS — I110 Hypertensive heart disease with heart failure: Secondary | ICD-10-CM | POA: Diagnosis not present

## 2017-12-17 DIAGNOSIS — Z9013 Acquired absence of bilateral breasts and nipples: Secondary | ICD-10-CM | POA: Diagnosis not present

## 2017-12-17 DIAGNOSIS — G40909 Epilepsy, unspecified, not intractable, without status epilepticus: Secondary | ICD-10-CM | POA: Diagnosis not present

## 2017-12-17 DIAGNOSIS — D519 Vitamin B12 deficiency anemia, unspecified: Secondary | ICD-10-CM | POA: Diagnosis not present

## 2017-12-17 DIAGNOSIS — Z853 Personal history of malignant neoplasm of breast: Secondary | ICD-10-CM | POA: Diagnosis not present

## 2017-12-17 DIAGNOSIS — I509 Heart failure, unspecified: Secondary | ICD-10-CM | POA: Diagnosis not present

## 2017-12-17 DIAGNOSIS — R41841 Cognitive communication deficit: Secondary | ICD-10-CM | POA: Diagnosis not present

## 2018-01-03 DIAGNOSIS — I482 Chronic atrial fibrillation: Secondary | ICD-10-CM | POA: Diagnosis not present

## 2018-01-03 DIAGNOSIS — R748 Abnormal levels of other serum enzymes: Secondary | ICD-10-CM | POA: Diagnosis not present

## 2018-01-03 DIAGNOSIS — R7989 Other specified abnormal findings of blood chemistry: Secondary | ICD-10-CM | POA: Diagnosis not present

## 2018-01-07 DIAGNOSIS — R001 Bradycardia, unspecified: Secondary | ICD-10-CM | POA: Diagnosis not present

## 2018-01-13 ENCOUNTER — Emergency Department
Admission: EM | Admit: 2018-01-13 | Discharge: 2018-01-13 | Disposition: A | Discharge status: Home / Self Care | Payer: Medicare HMO | Attending: Emergency Medicine | Admitting: Emergency Medicine

## 2018-01-13 ENCOUNTER — Emergency Department: Payer: Medicare HMO

## 2018-01-13 ENCOUNTER — Other Ambulatory Visit: Payer: Self-pay

## 2018-01-13 DIAGNOSIS — G40909 Epilepsy, unspecified, not intractable, without status epilepticus: Secondary | ICD-10-CM | POA: Diagnosis not present

## 2018-01-13 DIAGNOSIS — R4020 Unspecified coma: Secondary | ICD-10-CM | POA: Diagnosis not present

## 2018-01-13 DIAGNOSIS — Z950 Presence of cardiac pacemaker: Secondary | ICD-10-CM | POA: Diagnosis not present

## 2018-01-13 DIAGNOSIS — R569 Unspecified convulsions: Secondary | ICD-10-CM | POA: Insufficient documentation

## 2018-01-13 DIAGNOSIS — E119 Type 2 diabetes mellitus without complications: Secondary | ICD-10-CM | POA: Insufficient documentation

## 2018-01-13 DIAGNOSIS — R4182 Altered mental status, unspecified: Secondary | ICD-10-CM | POA: Diagnosis not present

## 2018-01-13 DIAGNOSIS — I110 Hypertensive heart disease with heart failure: Secondary | ICD-10-CM | POA: Diagnosis not present

## 2018-01-13 DIAGNOSIS — I5020 Unspecified systolic (congestive) heart failure: Secondary | ICD-10-CM | POA: Diagnosis not present

## 2018-01-13 LAB — COMPREHENSIVE METABOLIC PANEL
ALT: 14 U/L (ref 14–54)
ANION GAP: 9 (ref 5–15)
AST: 21 U/L (ref 15–41)
Albumin: 3.1 g/dL — ABNORMAL LOW (ref 3.5–5.0)
Alkaline Phosphatase: 104 U/L (ref 38–126)
BUN: 13 mg/dL (ref 6–20)
CALCIUM: 8.2 mg/dL — AB (ref 8.9–10.3)
CO2: 27 mmol/L (ref 22–32)
Chloride: 103 mmol/L (ref 101–111)
Creatinine, Ser: 1.02 mg/dL — ABNORMAL HIGH (ref 0.44–1.00)
GFR calc Af Amer: 56 mL/min — ABNORMAL LOW (ref >60)
GFR calc non Af Amer: 49 mL/min — ABNORMAL LOW (ref >60)
GLUCOSE: 90 mg/dL (ref 65–99)
Potassium: 3.7 mmol/L (ref 3.5–5.1)
Sodium: 139 mmol/L (ref 135–145)
Total Bilirubin: 0.7 mg/dL (ref 0.3–1.2)
Total Protein: 6.5 g/dL (ref 6.5–8.1)

## 2018-01-13 LAB — URINALYSIS, COMPLETE (UACMP) WITH MICROSCOPIC
BACTERIA UA: NONE SEEN
Bilirubin Urine: NEGATIVE
Glucose, UA: NEGATIVE mg/dL
Hgb urine dipstick: NEGATIVE
KETONES UR: NEGATIVE mg/dL
LEUKOCYTES UA: NEGATIVE
Nitrite: NEGATIVE
PH: 7 (ref 5.0–8.0)
PROTEIN: 100 mg/dL — AB
Specific Gravity, Urine: 1.009 (ref 1.005–1.030)

## 2018-01-13 LAB — TROPONIN I: Troponin I: <0.03 ng/mL (ref <0.03)

## 2018-01-13 LAB — CBC WITH DIFFERENTIAL/PLATELET
Basophils Absolute: 0.1 10*3/uL (ref 0–0.1)
Basophils Relative: 1 %
Eosinophils Absolute: 0.4 10*3/uL (ref 0–0.7)
Eosinophils Relative: 6 %
HEMATOCRIT: 36.9 % (ref 35.0–47.0)
Hemoglobin: 12.5 g/dL (ref 12.0–16.0)
LYMPHS ABS: 1.2 10*3/uL (ref 1.0–3.6)
LYMPHS PCT: 19 %
MCH: 29 pg (ref 26.0–34.0)
MCHC: 33.7 g/dL (ref 32.0–36.0)
MCV: 85.9 fL (ref 80.0–100.0)
MONOS PCT: 13 %
Monocytes Absolute: 0.8 10*3/uL (ref 0.2–0.9)
NEUTROS ABS: 3.8 10*3/uL (ref 1.4–6.5)
Neutrophils Relative %: 61 %
Platelets: 198 10*3/uL (ref 150–440)
RBC: 4.3 MIL/uL (ref 3.80–5.20)
RDW: 14.5 % (ref 11.5–14.5)
WBC: 6.3 10*3/uL (ref 3.6–11.0)

## 2018-01-13 MED ORDER — LEVETIRACETAM 750 MG PO TABS: 750.0000 mg | ORAL_TABLET | Freq: Two times a day (BID) | ORAL | 1 refills | Status: DC

## 2018-01-13 MED ORDER — LEVETIRACETAM 250 MG PO TABS
250.0000 mg | ORAL_TABLET | Freq: Once | ORAL | Status: AC
  Administered 2018-01-13: 250 mg via ORAL
  Filled 2018-01-13: qty 1

## 2018-01-13 NOTE — ED Notes (Signed)
In and out cath performed without difficulty - Kirke Shaggy RN as 2nd nurse

## 2018-01-13 NOTE — ED Provider Notes (Signed)
[facility] Emergency Department Provider Note       Time seen: ----------------------------------------- 8:06 AM on 01/13/2018 ----------------------------------------- Level V caveat: History/ROS limited by altered mental status  I have reviewed the triage vital signs and the nursing notes.  HISTORY   Chief Complaint Shaking    HPI Shelley Cameron is a 84 y.o. female with a history of A. fib, anemia, arthritis, diabetes, hyperlipidemia, hypertension, dementia and seizures who presents to the ED for shaking type event.  Patient is brought in by EMS from home.  Patient was found by her daughter in the shower standing up but shaking.  Daughter states it look like it may have been a seizure but she was not postictal, incontinent or having other complaints and was standing with this occurred.  EMS reports normal vital signs.  Patient has a history of dementia.  Past Medical History:  Diagnosis Date  . A-fib ([place])   . Anemia   . Arthritis   . Cancer ([place]) 1998, 1999   breast cancer. Bilateral mastectomy.   . Diabetes mellitus without complication ([place])   . DJD (degenerative joint disease)   . Gout   . Heart murmur   . Hyperlipidemia   . Hypertension   . Lymphedema of right arm   . Moderate dementia   . Seizures ([place])    Tonic-Clonic seizure activity  . Severe mitral insufficiency   . Stroke [facility]) 2011  . Syncope   . Systolic CHF ([place])   . Urinary incontinence     Patient Active Problem List   Diagnosis Date Noted  . Sick sinus syndrome ([place]) 08/01/2016  . Chest wall pain 05/30/2016    Past Surgical History:  Procedure Laterality Date  . BREAST SURGERY Left 08/1997   Dr Smith/mastectomy  . CATARACT EXTRACTION  2000  . EYE SURGERY Bilateral    Cataract Extraction with IOL  . MASTECTOMY Right 02/19/1997   Dr Bary Castilla  . MASTECTOMY Left 1999   Dr. Rochel Brome, [facility]  . PACEMAKER INSERTION Left 08/01/2016   Procedure: INSERTION PACEMAKER;   Surgeon: Isaias Cowman, MD;  Location: [facility] ORS;  Service: Cardiovascular;  Laterality: Left;    Allergies Codeine  Social History Social History   Tobacco Use  . Smoking status: Never Smoker  . Smokeless tobacco: Never Used  Substance Use Topics  . Alcohol use: No  . Drug use: No   Review of Systems Constitutional: Negative for fever. Cardiovascular: Negative for chest pain. Respiratory: Negative for shortness of breath. Gastrointestinal: Negative for abdominal pain, vomiting and diarrhea. Musculoskeletal: Negative for back pain. Skin: Negative for rash. Neurological: Negative for headaches, focal weakness or numbness.  Patient denies any complaints but has dementia.  ____________________________________________   PHYSICAL EXAM:  VITAL SIGNS: ED Triage Vitals [01/13/18 0804]  Enc Vitals Group     BP 104/72     Pulse Rate 76     Resp 14     Temp 98.2 F (36.8 C)     Temp Source Axillary     SpO2 97 %     Weight 182 lb (82.6 kg)     Height 5\' 6"  (1.676 m)     Head Circumference      Peak Flow      Pain Score 0     Pain Loc      Pain Edu?      Excl. in [place]?    Constitutional: Alert but disoriented, well appearing and in no distress. Eyes: Conjunctivae  are normal. Normal extraocular movements. ENT   Head: Normocephalic and atraumatic.   Nose: No congestion/rhinnorhea.   Mouth/Throat: Mucous membranes are moist.   Neck: No stridor. Cardiovascular: Normal rate, regular rhythm. No murmurs, rubs, or gallops. Respiratory: Normal respiratory effort without tachypnea nor retractions. Breath sounds are clear and equal bilaterally. No wheezes/rales/rhonchi. Gastrointestinal: Soft and nontender. Normal bowel sounds Musculoskeletal: Nontender with normal range of motion in extremities. No lower extremity tenderness nor edema. Neurologic:  Normal speech and language. No gross focal neurologic deficits are appreciated.  Skin:  Skin is warm, dry and  intact. No rash noted. Psychiatric: Mood and affect are normal. Speech and behavior are normal.  ____________________________________________  EKG: Interpreted by me.  Ventricular paced complexes are noted, normal axis, normal QT  ____________________________________________  ED COURSE:  As part of my medical decision making, I reviewed the following data within the [place] History obtained from family if available, nursing notes, old chart and ekg, as well as notes from prior ED visits. Patient presented for shaking, we will assess with labs and imaging as indicated at this time.  Daughter arrived to provide further information.  Patient was only shaking her right arm, has had these previous episodes before and has been diagnosed with seizures.  She currently takes Keppra twice a day.   Procedures ____________________________________________   LABS (pertinent positives/negatives)  Labs Reviewed  COMPREHENSIVE METABOLIC PANEL - Abnormal; Notable for the following components:      Result Value   Creatinine, Ser 1.02 (*)    Calcium 8.2 (*)    Albumin 3.1 (*)    GFR calc non Af Amer 49 (*)    GFR calc Af Amer 56 (*)    All other components within normal limits  URINALYSIS, COMPLETE (UACMP) WITH MICROSCOPIC - Abnormal; Notable for the following components:   Color, Urine YELLOW (*)    APPearance CLEAR (*)    Protein, ur 100 (*)    All other components within normal limits  CBC WITH DIFFERENTIAL/PLATELET  TROPONIN I    RADIOLOGY Images were viewed by me  CT head IMPRESSION: Chronic atrophic and ischemic changes similar to that seen on the prior exam.  No acute abnormality noted. ____________________________________________  DIFFERENTIAL DIAGNOSIS   Spasm, tremor, seizure, electrolyte abnormality, dehydration  FINAL ASSESSMENT AND PLAN  Seizure   Plan: The patient had presented for shaking episode at home. Patient's labs do not reveal any  acute process. Patient's imaging was also unremarkable.  I will increase her Keppra from 250 mg twice a day to 500 mg twice a day.  She is cleared for outpatient follow-up.   Laurence Aly, MD   Note: This note was generated in part or whole with voice recognition software. Voice recognition is usually quite accurate but there are transcription errors that can and very often do occur. I apologize for any typographical errors that were not detected and corrected.     Earleen Newport, MD 01/13/18 1034

## 2018-01-13 NOTE — ED Triage Notes (Signed)
Pt arrived via ems for report of "shaking" - pt was found by daughter in shower standing up but shaking - daughter states pt had a seizure but was standing erect and has no history of seizures - pt denies pain - ekg NS - VS normal

## 2018-01-13 NOTE — ED Notes (Signed)
Pt voided in pull up - pt turned, dried and repositioned - new pull up placed

## 2018-01-21 DIAGNOSIS — R1084 Generalized abdominal pain: Secondary | ICD-10-CM | POA: Diagnosis not present

## 2018-01-21 DIAGNOSIS — E538 Deficiency of other specified B group vitamins: Secondary | ICD-10-CM | POA: Diagnosis not present

## 2018-01-21 DIAGNOSIS — E782 Mixed hyperlipidemia: Secondary | ICD-10-CM | POA: Diagnosis not present

## 2018-01-21 DIAGNOSIS — G40209 Localization-related (focal) (partial) symptomatic epilepsy and epileptic syndromes with complex partial seizures, not intractable, without status epilepticus: Secondary | ICD-10-CM | POA: Diagnosis not present

## 2018-01-21 DIAGNOSIS — F0391 Unspecified dementia with behavioral disturbance: Secondary | ICD-10-CM | POA: Diagnosis not present

## 2018-01-21 DIAGNOSIS — R109 Unspecified abdominal pain: Secondary | ICD-10-CM | POA: Diagnosis not present

## 2018-01-21 DIAGNOSIS — D649 Anemia, unspecified: Secondary | ICD-10-CM | POA: Diagnosis not present

## 2018-01-21 DIAGNOSIS — Z853 Personal history of malignant neoplasm of breast: Secondary | ICD-10-CM | POA: Diagnosis not present

## 2018-01-21 DIAGNOSIS — M1A00X0 Idiopathic chronic gout, unspecified site, without tophus (tophi): Secondary | ICD-10-CM | POA: Diagnosis not present

## 2018-01-21 DIAGNOSIS — E559 Vitamin D deficiency, unspecified: Secondary | ICD-10-CM | POA: Diagnosis not present

## 2018-02-04 DIAGNOSIS — R748 Abnormal levels of other serum enzymes: Secondary | ICD-10-CM | POA: Diagnosis not present

## 2018-02-10 DIAGNOSIS — R1031 Right lower quadrant pain: Secondary | ICD-10-CM | POA: Diagnosis not present

## 2018-02-10 DIAGNOSIS — R3989 Other symptoms and signs involving the genitourinary system: Secondary | ICD-10-CM | POA: Diagnosis not present

## 2018-02-10 DIAGNOSIS — R1032 Left lower quadrant pain: Secondary | ICD-10-CM | POA: Diagnosis not present

## 2018-02-10 DIAGNOSIS — R102 Pelvic and perineal pain: Secondary | ICD-10-CM | POA: Diagnosis not present

## 2018-02-11 ENCOUNTER — Emergency Department: Payer: Medicare HMO

## 2018-02-11 ENCOUNTER — Other Ambulatory Visit: Payer: Self-pay

## 2018-02-11 ENCOUNTER — Emergency Department
Admission: EM | Admit: 2018-02-11 | Discharge: 2018-02-12 | Disposition: A | Discharge status: Home / Self Care | Payer: Medicare HMO | Attending: Emergency Medicine | Admitting: Emergency Medicine

## 2018-02-11 DIAGNOSIS — Z853 Personal history of malignant neoplasm of breast: Secondary | ICD-10-CM | POA: Insufficient documentation

## 2018-02-11 DIAGNOSIS — E119 Type 2 diabetes mellitus without complications: Secondary | ICD-10-CM | POA: Insufficient documentation

## 2018-02-11 DIAGNOSIS — Z950 Presence of cardiac pacemaker: Secondary | ICD-10-CM | POA: Diagnosis not present

## 2018-02-11 DIAGNOSIS — S066X0A Traumatic subarachnoid hemorrhage without loss of consciousness, initial encounter: Secondary | ICD-10-CM

## 2018-02-11 DIAGNOSIS — I110 Hypertensive heart disease with heart failure: Secondary | ICD-10-CM | POA: Insufficient documentation

## 2018-02-11 DIAGNOSIS — Z7901 Long term (current) use of anticoagulants: Secondary | ICD-10-CM | POA: Diagnosis not present

## 2018-02-11 DIAGNOSIS — Y999 Unspecified external cause status: Secondary | ICD-10-CM | POA: Diagnosis not present

## 2018-02-11 DIAGNOSIS — Y929 Unspecified place or not applicable: Secondary | ICD-10-CM | POA: Insufficient documentation

## 2018-02-11 DIAGNOSIS — I609 Nontraumatic subarachnoid hemorrhage, unspecified: Secondary | ICD-10-CM | POA: Diagnosis not present

## 2018-02-11 DIAGNOSIS — Z79899 Other long term (current) drug therapy: Secondary | ICD-10-CM | POA: Insufficient documentation

## 2018-02-11 DIAGNOSIS — S098XXA Other specified injuries of head, initial encounter: Secondary | ICD-10-CM | POA: Diagnosis present

## 2018-02-11 DIAGNOSIS — W1839XA Other fall on same level, initial encounter: Secondary | ICD-10-CM | POA: Diagnosis not present

## 2018-02-11 DIAGNOSIS — W19XXXA Unspecified fall, initial encounter: Secondary | ICD-10-CM | POA: Diagnosis not present

## 2018-02-11 DIAGNOSIS — Y9389 Activity, other specified: Secondary | ICD-10-CM | POA: Diagnosis not present

## 2018-02-11 DIAGNOSIS — R51 Headache: Secondary | ICD-10-CM | POA: Diagnosis not present

## 2018-02-11 DIAGNOSIS — I5020 Unspecified systolic (congestive) heart failure: Secondary | ICD-10-CM | POA: Insufficient documentation

## 2018-02-11 LAB — CBC WITH DIFFERENTIAL/PLATELET
Basophils Absolute: 0.1 10*3/uL (ref 0–0.1)
Basophils Relative: 1 %
EOS ABS: 0.4 10*3/uL (ref 0–0.7)
Eosinophils Relative: 4 %
HEMATOCRIT: 41.6 % (ref 35.0–47.0)
HEMOGLOBIN: 13.9 g/dL (ref 12.0–16.0)
LYMPHS ABS: 2.5 10*3/uL (ref 1.0–3.6)
LYMPHS PCT: 27 %
MCH: 28.4 pg (ref 26.0–34.0)
MCHC: 33.5 g/dL (ref 32.0–36.0)
MCV: 84.7 fL (ref 80.0–100.0)
MONOS PCT: 9 %
Monocytes Absolute: 0.8 10*3/uL (ref 0.2–0.9)
NEUTROS ABS: 5.6 10*3/uL (ref 1.4–6.5)
NEUTROS PCT: 59 %
Platelets: 243 10*3/uL (ref 150–440)
RBC: 4.91 MIL/uL (ref 3.80–5.20)
RDW: 14.5 % (ref 11.5–14.5)
WBC: 9.4 10*3/uL (ref 3.6–11.0)

## 2018-02-11 LAB — PROTIME-INR
INR: 1.09
PROTHROMBIN TIME: 14 s (ref 11.4–15.2)

## 2018-02-11 MED ORDER — PROTHROMBIN COMPLEX CONC HUMAN 500 UNITS IV KIT
4060.0000 [IU] | PACK | Status: AC
  Administered 2018-02-11: 4060 [IU] via INTRAVENOUS
  Filled 2018-02-11: qty 4060

## 2018-02-11 NOTE — ED Provider Notes (Signed)
[facility] Emergency Department Provider Note ____________________________________________   First MD Initiated Contact with Patient 02/11/18 2058     (approximate)  I have reviewed the triage vital signs and the nursing notes.   HISTORY  Chief Complaint Fall  Level 5 caveat: History of present illness limited due to dementia  HPI Shelley Cameron is a 84 y.o. female with PMH as noted below who presents after a fall.  Per the daughter, the patient was at home and was attempting to ambulate with her walker, when she fell from standing height and apparently hit the back of her head.  The patient had no LOC.  She is currently at her baseline mental status per the daughter, and had not been ill or had any complaints prior to the fall.   Past Medical History:  Diagnosis Date  . A-fib ([place])   . Anemia   . Arthritis   . Cancer ([place]) 1998, 1999   breast cancer. Bilateral mastectomy.   . Diabetes mellitus without complication ([place])   . DJD (degenerative joint disease)   . Gout   . Heart murmur   . Hyperlipidemia   . Hypertension   . Lymphedema of right arm   . Moderate dementia   . Seizures ([place])    Tonic-Clonic seizure activity  . Severe mitral insufficiency   . Stroke [facility]) 2011  . Syncope   . Systolic CHF ([place])   . Urinary incontinence     Patient Active Problem List   Diagnosis Date Noted  . Sick sinus syndrome ([place]) 08/01/2016  . Chest wall pain 05/30/2016    Past Surgical History:  Procedure Laterality Date  . BREAST SURGERY Left 08/1997   Dr Smith/mastectomy  . CATARACT EXTRACTION  2000  . EYE SURGERY Bilateral    Cataract Extraction with IOL  . MASTECTOMY Right 02/19/1997   Dr Bary Castilla  . MASTECTOMY Left 1999   Dr. Rochel Brome, [facility]  . PACEMAKER INSERTION Left 08/01/2016   Procedure: INSERTION PACEMAKER;  Surgeon: Isaias Cowman, MD;  Location: [facility] ORS;  Service: Cardiovascular;  Laterality: Left;    Prior to Admission  medications   Medication Sig Start Date End Date Taking? Authorizing Provider  acetaminophen (TYLENOL) 500 MG tablet Take 1,000 mg by mouth every 6 (six) hours as needed for mild pain.    [provider]  cetirizine (ZYRTEC) 10 MG tablet Take 10 mg by mouth at bedtime.    [provider]  Cholecalciferol (VITAMIN D) 2000 units CAPS Take 2,000 Units by mouth daily.     [provider]  colchicine 0.6 MG tablet Take 0.6 mg by mouth as needed (Gout).     [provider]  donepezil (ARICEPT) 5 MG tablet Take 5 mg by mouth at bedtime.    [provider]  lamoTRIgine (LAMICTAL) 25 MG tablet Take 50 mg by mouth 2 (two) times daily. 01/11/18   [provider]  levETIRAcetam (KEPPRA) 750 MG tablet Take 1 tablet (750 mg total) by mouth 2 (two) times daily. 01/13/18   Earleen Newport, MD  loratadine (CLARITIN) 10 MG tablet Take 10 mg by mouth daily as needed for allergies.     [provider]  lovastatin (MEVACOR) 40 MG tablet Take 40 mg by mouth at bedtime.    [provider]  memantine (NAMENDA) 5 MG tablet Take 5 mg by mouth 2 (two) times daily. 12/24/17   [provider]  QUEtiapine (SEROQUEL) 50 MG tablet  Take 50 mg by mouth at bedtime. 10/30/17   [provider]  Rivaroxaban (XARELTO) 15 MG TABS tablet Take 15 mg by mouth daily with supper.     [provider]  venlafaxine XR (EFFEXOR-XR) 37.5 MG 24 hr capsule Take 37.5 mg by mouth daily. 12/31/17   [provider]  vitamin B-12 (CYANOCOBALAMIN) 1000 MCG tablet Take 1,000 mcg by mouth daily.    [provider]    Allergies Codeine  Family History  Problem Relation Age of Onset  . Hypertension Father     Social History Social History   Tobacco Use  . Smoking status: Never Smoker  . Smokeless tobacco: Never Used  Substance Use Topics  . Alcohol use: No  . Drug use: No    Review of Systems Level 5 caveat: Unable to obtain  review of systems due to dementia    ____________________________________________   PHYSICAL EXAM:  VITAL SIGNS: ED Triage Vitals  Enc Vitals Group     BP 02/11/18 2033 (!) 173/99     Pulse Rate 02/11/18 2033 77     Resp 02/11/18 2033 18     Temp 02/11/18 2033 97.6 F (36.4 C)     Temp Source 02/11/18 2033 Oral     SpO2 02/11/18 2033 98 %     Weight 02/11/18 2032 180 lb (81.6 kg)     Height 02/11/18 2032 5\' 6"  (1.676 m)     Head Circumference --      Peak Flow --      Pain Score --      Pain Loc --      Pain Edu? --      Excl. in [place]? --     Constitutional: Alert, confused.  Comfortable appearing. Eyes: Conjunctivae are normal.  EOMI.  PERRLA. Head: Atraumatic. Nose: No congestion/rhinnorhea. Mouth/Throat: Mucous membranes are moist.   Neck: Normal range of motion.  No midline cervical spinal tenderness. Cardiovascular: Good peripheral circulation. Respiratory: Normal respiratory effort.  Gastrointestinal: Soft and nontender.   Genitourinary: No flank tenderness. Musculoskeletal:  Extremities warm and well perfused.  No midline spinal tenderness.  No extremity deformity or bony tenderness. Neurologic:  Normal speech and language. No gross focal neurologic deficits are appreciated.  Motor intact in all extremities. Skin:  Skin is warm and dry. No rash noted. Psychiatric: Mood and affect are normal. Speech and behavior are normal.  ____________________________________________   LABS (all labs ordered are listed, but only abnormal results are displayed)  Labs Reviewed  CBC WITH DIFFERENTIAL/PLATELET  BASIC METABOLIC PANEL  PROTIME-INR  TROPONIN I   ____________________________________________  EKG   ____________________________________________  RADIOLOGY  CT head: 4 x 13 mm occipital subarachnoid hemorrhage with no mass-effect or midline shift  ____________________________________________   PROCEDURES  Procedure(s) performed:  No  Procedures  Critical Care performed: No ____________________________________________   INITIAL IMPRESSION / ASSESSMENT AND PLAN / ED COURSE  Pertinent labs & imaging results that were available during my care of the patient were reviewed by me and considered in my medical decision making (see chart for details).  84 year old female with PMH as noted above presents for evaluation after a apparent mechanical fall from standing height with no LOC.  The patient has no complaints currently and there is no visible trauma on exam.  Per the daughter, the patient is at her baseline mental status and was at her baseline prior to the fall.  Given that the patient is on Xarelto I will obtain a CT  of the head.  No indication for cervical spine imaging.  No indication for lab work-up as the fall appears to be mechanical.  ----------------------------------------- 10:30 PM on 02/11/2018 -----------------------------------------  CT reveals very small acute subarachnoid hemorrhage to the occipital lobe with no mass-effect or midline shift.  I consulted Dr. Tommi Emery from neurosurgery.  Given that the patient is clinically stable and is extremely unlikely to require any acute neurosurgical intervention, he advises that although we could transfer the patient, it would also be reasonable to observe the patient for 6 hours and perform a repeat CT, with possible discharge home if there is no progression of the bleed.  I discussed these options with the patient's daughter.  She would prefer to have the patient observed here and discharged if there is no worsening.  I will give a dose of Kcentra for reversal of the patient's Xarelto, obtain labs, and we will plan to observe and obtain a repeat CT.  ----------------------------------------- 11:34 PM on 02/11/2018 -----------------------------------------  The patient remains stable.  CBC is normal.  The remainder of the lab work-up is pending.  She is  receiving Kcentra.  I am signing the patient out to the oncoming physician Dr. Karma Greaser.  Plan will be to repeat CT around 4am and if clinically stable with no worsening of the hemorrhage on CT, she may be discharged in the morning.   ____________________________________________   FINAL CLINICAL IMPRESSION(S) / ED DIAGNOSES  Final diagnoses:  Subarachnoid hematoma, without loss of consciousness, initial encounter ([place])      NEW MEDICATIONS STARTED DURING THIS VISIT:  New Prescriptions   No medications on file     Note:  This document was prepared using Dragon voice recognition software and may include unintentional dictation errors.    Arta Silence, MD 02/11/18 [phone]

## 2018-02-11 NOTE — ED Provider Notes (Signed)
-----------------------------------------   11:50 PM on 02/11/2018 -----------------------------------------   Assuming care from Dr. Cherylann Banas.  In short, Shelley Cameron is a 84 y.o. female with a chief complaint of subarachnoid hemorrhage after a fall.  Refer to the original H&P for additional details.  The current plan of care is to re-evaluate with another CT scan at 4:00am to determine if the head bleed is progressing or if it is stable.  Family is comfortable with the plan.  If clinically worsens, will need transfer, but anticipate outpatient neurosurgery follow up if she remains unchanged.    Patient is on Xarelto and received K-Centra tonight.  Anticipate that she will stay off Xarelto for a period of time, until cleared by NSG or her PCP.   ----------------------------------------- 5:10 AM on 02/12/2018 -----------------------------------------  Patient has been stable with no status changes.  Sleeping comfortably.  Repeat CT scan shows no progression of the original bleed and no additional concerning findings.  I spoke with her daughter at bedside and explained the results.  She is comfortable with the plan for outpatient follow-up.  She has an appointment in about 24 hours with Dr. Melrose Nakayama, the patient's neurologist.  I will also make sure she has follow-up information with Dr. Tommi Emery with neurosurgery.  I gave strict return precautions and encouraged her to not continue taking the Xarelto until cleared by her other doctors.   Hinda Kehr, MD 02/12/18 [phone]

## 2018-02-11 NOTE — ED Triage Notes (Signed)
Pt arrived via EMS from home d/t witnessed mechanical fall.EMS reports pt hit her head when she fell but no obvious injuries. Pt denies any pain and does have hx of dementia, diabetes, and lymphedema. Pt is NAD at this time.

## 2018-02-12 ENCOUNTER — Emergency Department: Payer: Medicare HMO

## 2018-02-12 ENCOUNTER — Other Ambulatory Visit: Payer: Self-pay | Admitting: Neurological Surgery

## 2018-02-12 DIAGNOSIS — I609 Nontraumatic subarachnoid hemorrhage, unspecified: Secondary | ICD-10-CM | POA: Diagnosis not present

## 2018-02-12 DIAGNOSIS — S066X0D Traumatic subarachnoid hemorrhage without loss of consciousness, subsequent encounter: Secondary | ICD-10-CM

## 2018-02-12 LAB — BASIC METABOLIC PANEL
ANION GAP: 11 (ref 5–15)
BUN: 12 mg/dL (ref 6–20)
CHLORIDE: 101 mmol/L (ref 101–111)
CO2: 29 mmol/L (ref 22–32)
Calcium: 8.8 mg/dL — ABNORMAL LOW (ref 8.9–10.3)
Creatinine, Ser: 0.94 mg/dL (ref 0.44–1.00)
GFR calc Af Amer: >60 mL/min (ref >60)
GFR, EST NON AFRICAN AMERICAN: 54 mL/min — AB (ref >60)
GLUCOSE: 99 mg/dL (ref 65–99)
POTASSIUM: 3.3 mmol/L — AB (ref 3.5–5.1)
Sodium: 141 mmol/L (ref 135–145)

## 2018-02-12 LAB — TROPONIN I

## 2018-02-12 NOTE — Discharge Instructions (Addendum)
As we discussed, you have a small amount of bleeding in your brain.  Fortunately it is not progressing.  We discussed the plan extensively and agreed that at least for now it is okay to go home and follow-up as an outpatient with your neurologist as scheduled.  We also recommend that you schedule an appointment with Dr. Tommi Emery, one of the neurosurgeons at [facility] can call in the morning to schedule the next available appointment.  If you develop any new or worsening symptoms that are concerning, please return immediately to the nearest emergency department.  We recommend that you not continue taking your Xarelto until you have an opportunity to follow-up either with your regular doctor, the neurologist, or the neurosurgeon, to ask for additional recommendations about how long you should be off the medication.  Of course there is always a risk of developing a blood clot or even having a stroke if you are off the Xarelto, but we know for certain that you have some bleeding on your brain and that is the most immediate concern.

## 2018-02-13 DIAGNOSIS — E538 Deficiency of other specified B group vitamins: Secondary | ICD-10-CM | POA: Diagnosis not present

## 2018-02-13 DIAGNOSIS — S3992XA Unspecified injury of lower back, initial encounter: Secondary | ICD-10-CM | POA: Diagnosis not present

## 2018-02-13 DIAGNOSIS — W19XXXA Unspecified fall, initial encounter: Secondary | ICD-10-CM | POA: Insufficient documentation

## 2018-02-13 DIAGNOSIS — R413 Other amnesia: Secondary | ICD-10-CM | POA: Diagnosis not present

## 2018-02-13 DIAGNOSIS — W19XXXS Unspecified fall, sequela: Secondary | ICD-10-CM | POA: Diagnosis not present

## 2018-02-13 DIAGNOSIS — R1084 Generalized abdominal pain: Secondary | ICD-10-CM | POA: Diagnosis not present

## 2018-02-13 DIAGNOSIS — R569 Unspecified convulsions: Secondary | ICD-10-CM | POA: Diagnosis not present

## 2018-02-19 ENCOUNTER — Other Ambulatory Visit: Payer: Self-pay

## 2018-02-23 DIAGNOSIS — I5020 Unspecified systolic (congestive) heart failure: Secondary | ICD-10-CM | POA: Insufficient documentation

## 2018-02-23 DIAGNOSIS — E119 Type 2 diabetes mellitus without complications: Secondary | ICD-10-CM | POA: Insufficient documentation

## 2018-02-23 DIAGNOSIS — M109 Gout, unspecified: Secondary | ICD-10-CM | POA: Insufficient documentation

## 2018-02-23 DIAGNOSIS — D649 Anemia, unspecified: Secondary | ICD-10-CM | POA: Insufficient documentation

## 2018-02-23 DIAGNOSIS — M1990 Unspecified osteoarthritis, unspecified site: Secondary | ICD-10-CM | POA: Insufficient documentation

## 2018-02-23 NOTE — Progress Notes (Signed)
02/28/2018 11:57 AM   Shelley Cameron 05/11/1930 [id number]  Referring provider: Adin Hector, MD [address] [place], [place] [address]  Chief Complaint  Patient presents with  . bladder pain    New Patient    HPI: Patient is an 84 year old African American female who is referred by Dr. Ramonita Lab for generalized abdominal pain.  History was obtained from daughter, Shelley Cameron.    For the last month, she has been complaining of suprapubic pain that radiates to her back.  10/10 pain.  The pain can last 30 minutes.  She is found to be incontinent at the time, but she is incontinent all the time.    She has frequency, urgency, nocturia, incontinence, intermittency and straining.  Patient denies any gross hematuria, dysuria or suprapubic/flank pain.  Patient denies any fevers, chills, nausea or vomiting.      PMH: Past Medical History:  Diagnosis Date  . A-fib ([place])   . Anemia   . Arthritis   . Cancer ([place]) 1998, 1999   breast cancer. Bilateral mastectomy.   . Diabetes mellitus without complication ([place])   . DJD (degenerative joint disease)   . Gout   . Heart murmur   . Hyperlipidemia   . Hypertension   . Lymphedema of right arm   . Moderate dementia   . Seizures ([place])    Tonic-Clonic seizure activity  . Severe mitral insufficiency   . Stroke [facility]) 2011  . Syncope   . Systolic CHF ([place])   . Urinary incontinence     Surgical History: Past Surgical History:  Procedure Laterality Date  . BREAST SURGERY Left 08/1997   Dr Smith/mastectomy  . CATARACT EXTRACTION  2000  . EYE SURGERY Bilateral    Cataract Extraction with IOL  . MASTECTOMY Right 02/19/1997   Dr Bary Castilla  . MASTECTOMY Left 1999   Dr. Rochel Brome, [facility]  . PACEMAKER INSERTION Left 08/01/2016   Procedure: INSERTION PACEMAKER;  Surgeon: Isaias Cowman, MD;  Location: [facility] ORS;  Service: Cardiovascular;  Laterality: Left;    Home Medications:  Allergies as of  02/28/2018      Reactions   Codeine Nausea Only      Medication List        Accurate as of 02/28/18 11:57 AM. Always use your most recent med list.          acetaminophen 500 MG tablet Commonly known as:  TYLENOL Take 1,000 mg by mouth every 6 (six) hours as needed for mild pain.   cetirizine 10 MG tablet Commonly known as:  ZYRTEC Take 10 mg by mouth at bedtime.   colchicine 0.6 MG tablet Take 0.6 mg by mouth as needed (Gout).   conjugated estrogens vaginal cream Commonly known as:  PREMARIN Apply 0.5mg  (pea-sized amount)  just inside the vaginal introitus with a finger-tip on  Monday, Wednesday and Friday nights.   donepezil 5 MG tablet Commonly known as:  ARICEPT Take 5 mg by mouth at bedtime.   estradiol 0.1 MG/GM vaginal cream Commonly known as:  ESTRACE VAGINAL Apply 0.5mg  (pea-sized amount)  just inside the vaginal introitus with a finger-tip on Monday, Wednesday and Friday nights.   lamoTRIgine 25 MG tablet Commonly known as:  LAMICTAL Take 50 mg by mouth 2 (two) times daily.   loratadine 10 MG tablet Commonly known as:  CLARITIN Take 10 mg by mouth daily as needed for allergies.   lovastatin 40 MG tablet Commonly  known as:  MEVACOR Take 40 mg by mouth at bedtime.   memantine 5 MG tablet Commonly known as:  NAMENDA Take 5 mg by mouth 2 (two) times daily.   QUEtiapine 50 MG tablet Commonly known as:  SEROQUEL Take 50 mg by mouth at bedtime.   Rivaroxaban 15 MG Tabs tablet Commonly known as:  XARELTO Take 15 mg by mouth daily with supper.   venlafaxine XR 37.5 MG 24 hr capsule Commonly known as:  EFFEXOR-XR Take 37.5 mg by mouth daily.   vitamin B-12 1000 MCG tablet Commonly known as:  CYANOCOBALAMIN Take 1,000 mcg by mouth daily.   Vitamin D 2000 units Caps Take 2,000 Units by mouth daily.       Allergies:  Allergies  Allergen Reactions  . Codeine Nausea Only    Family History: Family History  Problem Relation Age of Onset  .  Hypertension Father     Social History:  reports that she has never smoked. She has never used smokeless tobacco. She reports that she does not drink alcohol or use drugs.  ROS: UROLOGY Frequent Urination?: Yes Hard to postpone urination?: Yes Burning/pain with urination?: No Get up at night to urinate?: Yes Leakage of urine?: Yes Urine stream starts and stops?: No Trouble starting stream?: No Do you have to strain to urinate?: Yes Blood in urine?: No Urinary tract infection?: No Sexually transmitted disease?: No Injury to kidneys or bladder?: No Painful intercourse?: No Weak stream?: No Currently pregnant?: No Vaginal bleeding?: No Last menstrual period?: n  Gastrointestinal Nausea?: No Vomiting?: No Indigestion/heartburn?: No Diarrhea?: No Constipation?: Yes  Constitutional Fever: No Night sweats?: No Weight loss?: No Fatigue?: Yes  Skin Skin rash/lesions?: No Itching?: No  Eyes Blurred vision?: No Double vision?: No  Ears/Nose/Throat Sore throat?: No Sinus problems?: Yes  Hematologic/Lymphatic Swollen glands?: No Easy bruising?: No  Cardiovascular Leg swelling?: Yes Chest pain?: No  Respiratory Cough?: Yes Shortness of breath?: No  Endocrine Excessive thirst?: No  Musculoskeletal Back pain?: Yes Joint pain?: Yes  Neurological Headaches?: No Dizziness?: No  Psychologic Depression?: Yes Anxiety?: No  Physical Exam: BP (!) 160/80   Ht 5\' 3"  (1.6 m)   Wt 152 lb (68.9 kg)   BMI 26.93 kg/m   Constitutional:  Well nourished. Alert and oriented, No acute distress. HEENT: [place] AT, moist mucus membranes.  Trachea midline, no masses. Cardiovascular: No clubbing, cyanosis, or edema. Respiratory: Normal respiratory effort, no increased work of breathing. GI: Abdomen is soft, non tender, non distended, no abdominal masses. Liver and spleen not palpable.  No hernias appreciated.  Stool sample for occult testing is not indicated.   GU: No CVA  tenderness.  No bladder fullness or masses.  Inside the labia appeared to be the most tender to the patient when I examined her today.  Atrophic external genitalia, normal pubic hair distribution, no lesions.  Normal urethral meatus, no lesions, no prolapse, no discharge.   No urethral masses, tenderness and/or tenderness. No bladder fullness, tenderness or masses. Pale vagina mucosa, poor estrogen effect, no discharge, no lesions, good pelvic support, no cystocele or rectocele noted.  No cervical motion tenderness.  Uterus is freely mobile and non-fixed.  No adnexal/parametria masses or tenderness noted.  Anus and perineum are without rashes or lesions.    Skin: No rashes, bruises or suspicious lesions. Lymph: No cervical or inguinal adenopathy. Neurologic: Grossly intact, no focal deficits, moving all 4 extremities. Psychiatric: Normal mood and affect.  Laboratory Data: Lab Results  Component  Value Date   WBC 9.4 02/11/2018   HGB 13.9 02/11/2018   HCT 41.6 02/11/2018   MCV 84.7 02/11/2018   PLT 243 02/11/2018    Lab Results  Component Value Date   CREATININE 0.94 02/12/2018    No results found for: PSA  No results found for: TESTOSTERONE  Lab Results  Component Value Date   HGBA1C 5.9 (H) 05/30/2016    No results found for: TSH  No results found for: CHOL, HDL, CHOLHDL, VLDL, LDLCALC  Lab Results  Component Value Date   AST 21 01/13/2018   Lab Results  Component Value Date   ALT 14 01/13/2018   No components found for: ALKALINEPHOPHATASE No components found for: BILIRUBINTOTAL  No results found for: ESTRADIOL  I have reviewed the labs.   Pertinent Imaging: Pelvic UA and x-rays were negative per daughter Results for JANILAH, HOJNACKI (MRN [id number]) as of 02/28/2018 11:52  Ref. Range 02/28/2018 11:43  Scan Result Unknown 224ml      Assessment & Plan:    1. Abdominal pain Exam benign today  2. Vaginal atrophy Will start with vaginal estrogen cream three  nights weekly Daughter will call and let us know if it is helping  3. Incomplete bladder emptying ? If pain is being caused by the bladder distending and then releasing the urine PVR is moderate at today's exam but it is not a true value as patient cannot void on demand If vaginal estrogen cream is not effective may need to evaluate for episodes of urinary retention  Return for patient's daughter to call .  These notes generated with voice recognition software. I apologize for typographical errors.  Zara Council, PA-C  [facility] [address]  [place], [place] [address] [phone]

## 2018-02-24 ENCOUNTER — Ambulatory Visit
Admission: RE | Admit: 2018-02-24 | Discharge: 2018-02-24 | Disposition: A | Discharge status: Home / Self Care | Payer: Medicare HMO | Source: Ambulatory Visit | Attending: Neurological Surgery | Admitting: Neurological Surgery

## 2018-02-24 DIAGNOSIS — S0990XA Unspecified injury of head, initial encounter: Secondary | ICD-10-CM | POA: Insufficient documentation

## 2018-02-24 DIAGNOSIS — F0391 Unspecified dementia with behavioral disturbance: Secondary | ICD-10-CM | POA: Diagnosis not present

## 2018-02-24 DIAGNOSIS — S066X0S Traumatic subarachnoid hemorrhage without loss of consciousness, sequela: Secondary | ICD-10-CM | POA: Diagnosis not present

## 2018-02-24 DIAGNOSIS — S066X0D Traumatic subarachnoid hemorrhage without loss of consciousness, subsequent encounter: Secondary | ICD-10-CM

## 2018-02-24 DIAGNOSIS — Z09 Encounter for follow-up examination after completed treatment for conditions other than malignant neoplasm: Secondary | ICD-10-CM | POA: Diagnosis not present

## 2018-02-24 DIAGNOSIS — I739 Peripheral vascular disease, unspecified: Secondary | ICD-10-CM | POA: Insufficient documentation

## 2018-02-24 DIAGNOSIS — R413 Other amnesia: Secondary | ICD-10-CM | POA: Diagnosis not present

## 2018-02-24 DIAGNOSIS — R569 Unspecified convulsions: Secondary | ICD-10-CM | POA: Diagnosis not present

## 2018-02-24 DIAGNOSIS — G319 Degenerative disease of nervous system, unspecified: Secondary | ICD-10-CM | POA: Diagnosis not present

## 2018-02-24 DIAGNOSIS — I482 Chronic atrial fibrillation: Secondary | ICD-10-CM | POA: Diagnosis not present

## 2018-02-25 DIAGNOSIS — R296 Repeated falls: Secondary | ICD-10-CM | POA: Diagnosis not present

## 2018-02-25 DIAGNOSIS — F0391 Unspecified dementia with behavioral disturbance: Secondary | ICD-10-CM | POA: Diagnosis not present

## 2018-02-25 DIAGNOSIS — Z87820 Personal history of traumatic brain injury: Secondary | ICD-10-CM | POA: Diagnosis not present

## 2018-02-25 DIAGNOSIS — G40909 Epilepsy, unspecified, not intractable, without status epilepticus: Secondary | ICD-10-CM | POA: Diagnosis not present

## 2018-02-25 DIAGNOSIS — I110 Hypertensive heart disease with heart failure: Secondary | ICD-10-CM | POA: Diagnosis not present

## 2018-02-25 DIAGNOSIS — I509 Heart failure, unspecified: Secondary | ICD-10-CM | POA: Diagnosis not present

## 2018-02-25 DIAGNOSIS — I482 Chronic atrial fibrillation: Secondary | ICD-10-CM | POA: Diagnosis not present

## 2018-02-25 DIAGNOSIS — R2689 Other abnormalities of gait and mobility: Secondary | ICD-10-CM | POA: Diagnosis not present

## 2018-02-25 DIAGNOSIS — E119 Type 2 diabetes mellitus without complications: Secondary | ICD-10-CM | POA: Diagnosis not present

## 2018-02-28 ENCOUNTER — Encounter: Payer: Self-pay | Admitting: Urology

## 2018-02-28 ENCOUNTER — Other Ambulatory Visit: Payer: Self-pay

## 2018-02-28 ENCOUNTER — Ambulatory Visit (INDEPENDENT_AMBULATORY_CARE_PROVIDER_SITE_OTHER): Payer: Medicare HMO | Admitting: Urology

## 2018-02-28 VITALS — BP 160/80 | Ht 63.0 in | Wt 152.0 lb

## 2018-02-28 DIAGNOSIS — R339 Retention of urine, unspecified: Secondary | ICD-10-CM

## 2018-02-28 DIAGNOSIS — N952 Postmenopausal atrophic vaginitis: Secondary | ICD-10-CM

## 2018-02-28 DIAGNOSIS — R1084 Generalized abdominal pain: Secondary | ICD-10-CM

## 2018-02-28 DIAGNOSIS — R109 Unspecified abdominal pain: Secondary | ICD-10-CM | POA: Diagnosis not present

## 2018-02-28 LAB — BLADDER SCAN AMB NON-IMAGING

## 2018-02-28 MED ORDER — ESTROGENS, CONJUGATED 0.625 MG/GM VA CREA: TOPICAL_CREAM | VAGINAL | 12 refills | Status: DC

## 2018-02-28 MED ORDER — ESTRADIOL 0.1 MG/GM VA CREA: TOPICAL_CREAM | VAGINAL | 12 refills | Status: DC

## 2018-03-03 DIAGNOSIS — F0391 Unspecified dementia with behavioral disturbance: Secondary | ICD-10-CM | POA: Diagnosis not present

## 2018-03-03 DIAGNOSIS — I509 Heart failure, unspecified: Secondary | ICD-10-CM | POA: Diagnosis not present

## 2018-03-03 DIAGNOSIS — R2689 Other abnormalities of gait and mobility: Secondary | ICD-10-CM | POA: Diagnosis not present

## 2018-03-03 DIAGNOSIS — E119 Type 2 diabetes mellitus without complications: Secondary | ICD-10-CM | POA: Diagnosis not present

## 2018-03-03 DIAGNOSIS — I110 Hypertensive heart disease with heart failure: Secondary | ICD-10-CM | POA: Diagnosis not present

## 2018-03-03 DIAGNOSIS — Z87820 Personal history of traumatic brain injury: Secondary | ICD-10-CM | POA: Diagnosis not present

## 2018-03-03 DIAGNOSIS — G40909 Epilepsy, unspecified, not intractable, without status epilepticus: Secondary | ICD-10-CM | POA: Diagnosis not present

## 2018-03-03 DIAGNOSIS — I482 Chronic atrial fibrillation: Secondary | ICD-10-CM | POA: Diagnosis not present

## 2018-03-03 DIAGNOSIS — R296 Repeated falls: Secondary | ICD-10-CM | POA: Diagnosis not present

## 2018-03-04 DIAGNOSIS — R55 Syncope and collapse: Secondary | ICD-10-CM | POA: Diagnosis not present

## 2018-03-04 DIAGNOSIS — I5020 Unspecified systolic (congestive) heart failure: Secondary | ICD-10-CM | POA: Diagnosis not present

## 2018-03-04 DIAGNOSIS — I482 Chronic atrial fibrillation: Secondary | ICD-10-CM | POA: Diagnosis not present

## 2018-03-04 DIAGNOSIS — I340 Nonrheumatic mitral (valve) insufficiency: Secondary | ICD-10-CM | POA: Diagnosis not present

## 2018-03-05 DIAGNOSIS — I110 Hypertensive heart disease with heart failure: Secondary | ICD-10-CM | POA: Diagnosis not present

## 2018-03-05 DIAGNOSIS — Z87820 Personal history of traumatic brain injury: Secondary | ICD-10-CM | POA: Diagnosis not present

## 2018-03-05 DIAGNOSIS — I482 Chronic atrial fibrillation: Secondary | ICD-10-CM | POA: Diagnosis not present

## 2018-03-05 DIAGNOSIS — R296 Repeated falls: Secondary | ICD-10-CM | POA: Diagnosis not present

## 2018-03-05 DIAGNOSIS — F0391 Unspecified dementia with behavioral disturbance: Secondary | ICD-10-CM | POA: Diagnosis not present

## 2018-03-05 DIAGNOSIS — I509 Heart failure, unspecified: Secondary | ICD-10-CM | POA: Diagnosis not present

## 2018-03-05 DIAGNOSIS — R2689 Other abnormalities of gait and mobility: Secondary | ICD-10-CM | POA: Diagnosis not present

## 2018-03-05 DIAGNOSIS — E119 Type 2 diabetes mellitus without complications: Secondary | ICD-10-CM | POA: Diagnosis not present

## 2018-03-05 DIAGNOSIS — G40909 Epilepsy, unspecified, not intractable, without status epilepticus: Secondary | ICD-10-CM | POA: Diagnosis not present

## 2018-03-06 ENCOUNTER — Encounter: Payer: Self-pay | Admitting: Emergency Medicine

## 2018-03-06 ENCOUNTER — Emergency Department: Payer: Medicare HMO

## 2018-03-06 ENCOUNTER — Emergency Department
Admission: EM | Admit: 2018-03-06 | Discharge: 2018-03-06 | Disposition: A | Discharge status: Home / Self Care | Payer: Medicare HMO | Attending: Emergency Medicine | Admitting: Emergency Medicine

## 2018-03-06 DIAGNOSIS — R569 Unspecified convulsions: Secondary | ICD-10-CM | POA: Diagnosis not present

## 2018-03-06 DIAGNOSIS — I110 Hypertensive heart disease with heart failure: Secondary | ICD-10-CM | POA: Insufficient documentation

## 2018-03-06 DIAGNOSIS — Z79899 Other long term (current) drug therapy: Secondary | ICD-10-CM | POA: Insufficient documentation

## 2018-03-06 DIAGNOSIS — N3090 Cystitis, unspecified without hematuria: Secondary | ICD-10-CM | POA: Insufficient documentation

## 2018-03-06 DIAGNOSIS — W19XXXA Unspecified fall, initial encounter: Secondary | ICD-10-CM

## 2018-03-06 DIAGNOSIS — Y92009 Unspecified place in unspecified non-institutional (private) residence as the place of occurrence of the external cause: Secondary | ICD-10-CM

## 2018-03-06 DIAGNOSIS — Z7901 Long term (current) use of anticoagulants: Secondary | ICD-10-CM | POA: Insufficient documentation

## 2018-03-06 DIAGNOSIS — F0390 Unspecified dementia without behavioral disturbance: Secondary | ICD-10-CM | POA: Insufficient documentation

## 2018-03-06 DIAGNOSIS — E119 Type 2 diabetes mellitus without complications: Secondary | ICD-10-CM | POA: Diagnosis not present

## 2018-03-06 DIAGNOSIS — I5020 Unspecified systolic (congestive) heart failure: Secondary | ICD-10-CM | POA: Diagnosis not present

## 2018-03-06 LAB — URINALYSIS, COMPLETE (UACMP) WITH MICROSCOPIC
BILIRUBIN URINE: NEGATIVE
GLUCOSE, UA: NEGATIVE mg/dL
Ketones, ur: NEGATIVE mg/dL
NITRITE: NEGATIVE
PH: 6 (ref 5.0–8.0)
Protein, ur: 100 mg/dL — AB
SPECIFIC GRAVITY, URINE: 1.013 (ref 1.005–1.030)
WBC, UA: >50 WBC/hpf — ABNORMAL HIGH (ref 0–5)

## 2018-03-06 MED ORDER — CEPHALEXIN 500 MG PO CAPS: 500.0000 mg | ORAL_CAPSULE | Freq: Two times a day (BID) | ORAL | 0 refills | Status: DC

## 2018-03-06 MED ORDER — CEPHALEXIN 500 MG PO CAPS
500.0000 mg | ORAL_CAPSULE | Freq: Once | ORAL | Status: AC
  Administered 2018-03-06: 500 mg via ORAL
  Filled 2018-03-06: qty 1

## 2018-03-06 NOTE — Discharge Instructions (Signed)
Your CT scan of the head was okay today. Your urine test shows a urinary tract infection. Take keflex as prescribed to treat this infection and follow up with your doctor.

## 2018-03-06 NOTE — ED Triage Notes (Signed)
Pt arrived via ems from home. Loud noise heard from family and upon entering room family noticed pt had seizure like activity while pt was on the floor. Family reports to ems that they think pt hit her head on nightstand. Pt alert upon arrival. No family currently at bedside

## 2018-03-06 NOTE — ED Provider Notes (Signed)
[facility] Emergency Department Provider Note  ____________________________________________  Time seen: Approximately 10:42 AM  I have reviewed the triage vital signs and the nursing notes.   HISTORY  Chief Complaint Fall and Seizures  Level 5 Caveat: Portions of the History and Physical including HPI and review of systems are unable to be completely obtained due to patient being a poor historian due to dementia  HPI Shelley Cameron is a 84 y.o. female with a history of atrial fibrillation, diabetes, hypertension, seizures, dementia is brought to the ED today after a fall at home.  Unwitnessed, family suspect patient hit her head on the nightstand but are not sure.  No unusual symptoms recently.      Past Medical History:  Diagnosis Date  . A-fib ([place])   . Anemia   . Arthritis   . Cancer ([place]) 1998, 1999   breast cancer. Bilateral mastectomy.   . Diabetes mellitus without complication ([place])   . DJD (degenerative joint disease)   . Gout   . Heart murmur   . Hyperlipidemia   . Hypertension   . Lymphedema of right arm   . Moderate dementia   . Seizures ([place])    Tonic-Clonic seizure activity  . Severe mitral insufficiency   . Stroke [facility]) 2011  . Syncope   . Systolic CHF ([place])   . Urinary incontinence      Patient Active Problem List   Diagnosis Date Noted  . Anemia, unspecified 02/23/2018  . Arthritis 02/23/2018  . Congestive heart failure with left ventricular systolic dysfunction ([place]) 02/23/2018  . Diabetes mellitus type 2, uncomplicated ([place]) 64/40/3474  . Gout 02/23/2018  . Cause of injury, fall 02/13/2018  . Cardiac syncope 11/26/2017  . Sick sinus syndrome ([place]) 08/01/2016  . Chest wall pain 05/30/2016  . B12 deficiency 04/25/2016  . Edema of right lower extremity 01/10/2016  . Chronic a-fib ([place]) 09/07/2014     Past Surgical History:  Procedure Laterality Date  . BREAST SURGERY Left 08/1997   Dr Smith/mastectomy  .  CATARACT EXTRACTION  2000  . EYE SURGERY Bilateral    Cataract Extraction with IOL  . MASTECTOMY Right 02/19/1997   Dr Bary Castilla  . MASTECTOMY Left 1999   Dr. Rochel Brome, [facility]  . PACEMAKER INSERTION Left 08/01/2016   Procedure: INSERTION PACEMAKER;  Surgeon: Isaias Cowman, MD;  Location: [facility] ORS;  Service: Cardiovascular;  Laterality: Left;     Prior to Admission medications   Medication Sig Start Date End Date Taking? Authorizing Provider  cetirizine (ZYRTEC) 10 MG tablet Take 10 mg by mouth at bedtime.   Yes [provider]  Cholecalciferol (VITAMIN D) 2000 units CAPS Take 2,000 Units by mouth daily.    Yes [provider]  conjugated estrogens (PREMARIN) vaginal cream Apply 0.5mg  (pea-sized amount)  just inside the vaginal introitus with a finger-tip on  Monday, Wednesday and Friday nights. 02/28/18  Yes McGowan, Larene Beach A, PA-C  donepezil (ARICEPT) 10 MG tablet Take 10 mg by mouth at bedtime.    Yes [provider]  lamoTRIgine (LAMICTAL) 25 MG tablet Take 25 mg by mouth 2 (two) times daily.  01/11/18  Yes [provider]  lovastatin (MEVACOR) 40 MG tablet Take 40 mg by mouth at bedtime.   Yes [provider]  memantine (NAMENDA) 5 MG tablet Take 5 mg by mouth 2 (two) times daily. 12/24/17  Yes [provider]  QUEtiapine (SEROQUEL) 50 MG tablet Take 50 mg by mouth at bedtime.  10/30/17  Yes [provider]  Rivaroxaban (XARELTO) 15 MG TABS tablet Take 15 mg by mouth daily with supper.    Yes [provider]  venlafaxine XR (EFFEXOR-XR) 37.5 MG 24 hr capsule Take 37.5 mg by mouth daily. 12/31/17  Yes [provider]  vitamin B-12 (CYANOCOBALAMIN) 1000 MCG tablet Take 1,000 mcg by mouth daily.   Yes [provider]  acetaminophen (TYLENOL) 500 MG tablet Take 1,000 mg by mouth every 6 (six) hours as needed for mild pain.    [provider]  cephALEXin (KEFLEX) 500 MG capsule Take 1 capsule (500  mg total) by mouth 2 (two) times daily. 03/06/18   Carrie Mew, MD  colchicine 0.6 MG tablet Take 0.6 mg by mouth as needed (Gout).     [provider]  estradiol (ESTRACE VAGINAL) 0.1 MG/GM vaginal cream Apply 0.5mg  (pea-sized amount)  just inside the vaginal introitus with a finger-tip on Monday, Wednesday and Friday nights. Patient not taking: Reported on 03/06/2018 02/28/18   Zara Council A, PA-C  loratadine (CLARITIN) 10 MG tablet Take 10 mg by mouth daily as needed for allergies.     [provider]     Allergies Codeine   Family History  Problem Relation Age of Onset  . Hypertension Father     Social History Social History   Tobacco Use  . Smoking status: Never Smoker  . Smokeless tobacco: Never Used  Substance Use Topics  . Alcohol use: No  . Drug use: No    Review of Systems Level 5 Caveat: Portions of the History and Physical including HPI and review of systems are unable to be completely obtained due to patient being a poor historian  Constitutional:   No fever   Cardiovascular:   No chest pain. Respiratory:   No shortness of breath. Gastrointestinal:   Negative for abdominal pain.   ____________________________________________   PHYSICAL EXAM:  VITAL SIGNS: ED Triage Vitals  Enc Vitals Group     BP 03/06/18 0821 112/85     Pulse Rate 03/06/18 0821 82     Resp 03/06/18 0821 20     Temp 03/06/18 0821 97.8 F (36.6 C)     Temp Source 03/06/18 0821 Oral     SpO2 03/06/18 0821 98 %     Weight 03/06/18 0820 152 lb (68.9 kg)     Height 03/06/18 0820 5\' 3"  (1.6 m)     Head Circumference --      Peak Flow --      Pain Score --      Pain Loc --      Pain Edu? --      Excl. in [place]? --     Vital signs reviewed, nursing assessments reviewed.   Constitutional:   Alert and oriented to self. Non-toxic appearance. Eyes:   Conjunctivae are normal. EOMI. PERRL. ENT      Head:   Normocephalic and atraumatic.      Nose:   No  congestion/rhinnorhea.  No epistaxis      Mouth/Throat:   MMM, no pharyngeal erythema. No peritonsillar mass.  No intraoral injuries      Neck:   No meningismus. Full ROM.  No midline tenderness Hematological/Lymphatic/Immunilogical:   No cervical lymphadenopathy. Cardiovascular:   Irregularly irregular rhythm. Symmetric bilateral radial and DP pulses.  No murmurs.  Respiratory:   Normal respiratory effort without tachypnea/retractions. Breath sounds are clear and equal bilaterally. No wheezes/rales/rhonchi. Gastrointestinal:   Soft with mild suprapubic  tenderness. Non distended. There is no CVA tenderness.  No rebound, rigidity, or guarding. Musculoskeletal:   Normal range of motion in all extremities. No joint effusions.  No lower extremity tenderness.  No edema. Neurologic:   Normal speech and language.  Motor grossly intact. No acute focal neurologic deficits are appreciated.  Skin:    Skin is warm, dry and intact. No rash noted.  No petechiae, purpura, or bullae.  ____________________________________________    LABS (pertinent positives/negatives) (all labs ordered are listed, but only abnormal results are displayed) Labs Reviewed  URINALYSIS, COMPLETE (UACMP) WITH MICROSCOPIC - Abnormal; Notable for the following components:      Result Value   Color, Urine YELLOW (*)    APPearance CLOUDY (*)    Hgb urine dipstick SMALL (*)    Protein, ur 100 (*)    Leukocytes, UA LARGE (*)    WBC, UA >50 (*)    Bacteria, UA FEW (*)    All other components within normal limits  URINE CULTURE   ____________________________________________   EKG  Interpreted by me Atrial fibrillation rate 123, normal axis intervals QRS ST segments .  Slight T wave inversion in V4 and V5, nonischemic in appearance.  Unchanged from previous EKG Jan 13, 2018  ____________________________________________    RADIOLOGY  Ct Head Wo Contrast  Result Date: 03/06/2018 CLINICAL DATA:  Seizure with questionable  fall EXAM: CT HEAD WITHOUT CONTRAST TECHNIQUE: Contiguous axial images were obtained from the base of the skull through the vertex without intravenous contrast. COMPARISON:  February 24, 2018 and February 12, 2018 FINDINGS: Brain: There is extensive diffuse atrophy, stable. The previous right parietal subarachnoid hemorrhage is no longer evident. No hemorrhage is currently seen. There is no demonstrable mass. There is no midline shift or extra-axial fluid collection. There is evidence of a prior infarct in the right frontal lobe. There is extensive small vessel disease throughout the centra semiovale bilaterally. There is no new gray-white compartment lesion. No evident acute infarct. Vascular: No hyperdense vessels are evident. There is calcification in each carotid siphon region. Skull: The bony calvarium appears intact. There are small exostoses arising from the midline frontal bone region, stable. Sinuses/Orbits: There is mucosal thickening in several ethmoid air cells. Other visualized paranasal sinuses are clear. Visualized orbits appear symmetric bilaterally. Other: Mastoid air cells are clear. IMPRESSION: Extensive atrophy with extensive supratentorial small vessel disease. Prior infarct mid right frontal lobe, stable. No acute infarct evident. No mass or hemorrhage. There are foci of arterial vascular calcification. There is mucosal thickening in several ethmoid air cells. Electronically Signed   By: Lowella Grip III M.D.   On: 03/06/2018 09:03    ____________________________________________   PROCEDURES Procedures  ____________________________________________  DIFFERENTIAL DIAGNOSIS   Urinary tract infection, subdural hematoma, skull fracture  CLINICAL IMPRESSION / ASSESSMENT AND PLAN / ED COURSE  Pertinent labs & imaging results that were available during my care of the patient were reviewed by me and considered in my medical decision making (see chart for details).    Patient with  dementia presents after suspected head trauma after an unwitnessed fall.  I suspect urinary tract infection.  Check a CT head, check urinalysis.  Clinical Course as of Mar 06 1041  Thu Mar 06, 2018  0929 Ct head negative for ICH. NAD.   [PS]  1031 UA indicative of urinary tract infection, likely contributing to her fall today.  I will start her on antibiotics after reviewing the electronic medical record for available previous  urine culture data and allergy profile..  WBC, UA(!): >50 [PS]    Clinical Course User Index [PS] Carrie Mew, MD     ____________________________________________   FINAL CLINICAL IMPRESSION(S) / ED DIAGNOSES    Final diagnoses:  Fall in home, initial encounter  Cystitis     ED Discharge Orders        Ordered    cephALEXin (KEFLEX) 500 MG capsule  2 times daily     03/06/18 1041      Portions of this note were generated with dragon dictation software. Dictation errors may occur despite best attempts at proofreading.    Carrie Mew, MD 03/06/18 1046

## 2018-03-06 NOTE — ED Notes (Signed)
Family at bedside. 

## 2018-03-07 LAB — URINE CULTURE: Culture: NO GROWTH

## 2018-03-10 DIAGNOSIS — I482 Chronic atrial fibrillation: Secondary | ICD-10-CM | POA: Diagnosis not present

## 2018-03-11 DIAGNOSIS — E119 Type 2 diabetes mellitus without complications: Secondary | ICD-10-CM | POA: Diagnosis not present

## 2018-03-11 DIAGNOSIS — M1A00X0 Idiopathic chronic gout, unspecified site, without tophus (tophi): Secondary | ICD-10-CM | POA: Diagnosis not present

## 2018-03-11 DIAGNOSIS — G40209 Localization-related (focal) (partial) symptomatic epilepsy and epileptic syndromes with complex partial seizures, not intractable, without status epilepticus: Secondary | ICD-10-CM | POA: Diagnosis not present

## 2018-03-11 DIAGNOSIS — E782 Mixed hyperlipidemia: Secondary | ICD-10-CM | POA: Diagnosis not present

## 2018-03-11 DIAGNOSIS — R109 Unspecified abdominal pain: Secondary | ICD-10-CM | POA: Diagnosis not present

## 2018-03-11 DIAGNOSIS — D649 Anemia, unspecified: Secondary | ICD-10-CM | POA: Diagnosis not present

## 2018-03-11 DIAGNOSIS — I482 Chronic atrial fibrillation: Secondary | ICD-10-CM | POA: Diagnosis not present

## 2018-03-11 DIAGNOSIS — F0391 Unspecified dementia with behavioral disturbance: Secondary | ICD-10-CM | POA: Diagnosis not present

## 2018-03-11 DIAGNOSIS — R1084 Generalized abdominal pain: Secondary | ICD-10-CM | POA: Diagnosis not present

## 2018-03-11 DIAGNOSIS — I5020 Unspecified systolic (congestive) heart failure: Secondary | ICD-10-CM | POA: Diagnosis not present

## 2018-03-12 DIAGNOSIS — E119 Type 2 diabetes mellitus without complications: Secondary | ICD-10-CM | POA: Diagnosis not present

## 2018-03-12 DIAGNOSIS — I509 Heart failure, unspecified: Secondary | ICD-10-CM | POA: Diagnosis not present

## 2018-03-12 DIAGNOSIS — I482 Chronic atrial fibrillation: Secondary | ICD-10-CM | POA: Diagnosis not present

## 2018-03-12 DIAGNOSIS — R2689 Other abnormalities of gait and mobility: Secondary | ICD-10-CM | POA: Diagnosis not present

## 2018-03-12 DIAGNOSIS — I110 Hypertensive heart disease with heart failure: Secondary | ICD-10-CM | POA: Diagnosis not present

## 2018-03-12 DIAGNOSIS — R296 Repeated falls: Secondary | ICD-10-CM | POA: Diagnosis not present

## 2018-03-12 DIAGNOSIS — Z87820 Personal history of traumatic brain injury: Secondary | ICD-10-CM | POA: Diagnosis not present

## 2018-03-12 DIAGNOSIS — G40909 Epilepsy, unspecified, not intractable, without status epilepticus: Secondary | ICD-10-CM | POA: Diagnosis not present

## 2018-03-12 DIAGNOSIS — F0391 Unspecified dementia with behavioral disturbance: Secondary | ICD-10-CM | POA: Diagnosis not present

## 2018-03-14 DIAGNOSIS — I482 Chronic atrial fibrillation: Secondary | ICD-10-CM | POA: Diagnosis not present

## 2018-03-18 DIAGNOSIS — R42 Dizziness and giddiness: Secondary | ICD-10-CM | POA: Diagnosis not present

## 2018-03-18 DIAGNOSIS — I5020 Unspecified systolic (congestive) heart failure: Secondary | ICD-10-CM | POA: Diagnosis not present

## 2018-03-18 DIAGNOSIS — R55 Syncope and collapse: Secondary | ICD-10-CM | POA: Diagnosis not present

## 2018-03-18 DIAGNOSIS — I482 Chronic atrial fibrillation: Secondary | ICD-10-CM | POA: Diagnosis not present

## 2018-03-19 DIAGNOSIS — F0391 Unspecified dementia with behavioral disturbance: Secondary | ICD-10-CM | POA: Diagnosis not present

## 2018-03-19 DIAGNOSIS — G40909 Epilepsy, unspecified, not intractable, without status epilepticus: Secondary | ICD-10-CM | POA: Diagnosis not present

## 2018-03-19 DIAGNOSIS — I509 Heart failure, unspecified: Secondary | ICD-10-CM | POA: Diagnosis not present

## 2018-03-19 DIAGNOSIS — I110 Hypertensive heart disease with heart failure: Secondary | ICD-10-CM | POA: Diagnosis not present

## 2018-03-19 DIAGNOSIS — I482 Chronic atrial fibrillation: Secondary | ICD-10-CM | POA: Diagnosis not present

## 2018-03-19 DIAGNOSIS — E119 Type 2 diabetes mellitus without complications: Secondary | ICD-10-CM | POA: Diagnosis not present

## 2018-03-19 DIAGNOSIS — R296 Repeated falls: Secondary | ICD-10-CM | POA: Diagnosis not present

## 2018-03-19 DIAGNOSIS — Z87820 Personal history of traumatic brain injury: Secondary | ICD-10-CM | POA: Diagnosis not present

## 2018-03-19 DIAGNOSIS — R2689 Other abnormalities of gait and mobility: Secondary | ICD-10-CM | POA: Diagnosis not present

## 2018-03-23 ENCOUNTER — Other Ambulatory Visit: Payer: Self-pay

## 2018-03-23 ENCOUNTER — Encounter: Payer: Self-pay | Admitting: Emergency Medicine

## 2018-03-23 ENCOUNTER — Emergency Department: Payer: Medicare HMO

## 2018-03-23 ENCOUNTER — Emergency Department
Admission: EM | Admit: 2018-03-23 | Discharge: 2018-03-23 | Disposition: A | Discharge status: Home / Self Care | Payer: Medicare HMO | Source: Home / Self Care | Attending: Emergency Medicine | Admitting: Emergency Medicine

## 2018-03-23 DIAGNOSIS — I340 Nonrheumatic mitral (valve) insufficiency: Secondary | ICD-10-CM | POA: Diagnosis present

## 2018-03-23 DIAGNOSIS — Z8673 Personal history of transient ischemic attack (TIA), and cerebral infarction without residual deficits: Secondary | ICD-10-CM

## 2018-03-23 DIAGNOSIS — Z66 Do not resuscitate: Secondary | ICD-10-CM | POA: Diagnosis not present

## 2018-03-23 DIAGNOSIS — Z853 Personal history of malignant neoplasm of breast: Secondary | ICD-10-CM | POA: Insufficient documentation

## 2018-03-23 DIAGNOSIS — R41841 Cognitive communication deficit: Secondary | ICD-10-CM | POA: Diagnosis not present

## 2018-03-23 DIAGNOSIS — R0602 Shortness of breath: Secondary | ICD-10-CM | POA: Diagnosis not present

## 2018-03-23 DIAGNOSIS — Z79899 Other long term (current) drug therapy: Secondary | ICD-10-CM | POA: Insufficient documentation

## 2018-03-23 DIAGNOSIS — G9340 Encephalopathy, unspecified: Secondary | ICD-10-CM | POA: Diagnosis present

## 2018-03-23 DIAGNOSIS — I82412 Acute embolism and thrombosis of left femoral vein: Secondary | ICD-10-CM | POA: Diagnosis not present

## 2018-03-23 DIAGNOSIS — Z7189 Other specified counseling: Secondary | ICD-10-CM | POA: Diagnosis not present

## 2018-03-23 DIAGNOSIS — G9341 Metabolic encephalopathy: Secondary | ICD-10-CM | POA: Diagnosis present

## 2018-03-23 DIAGNOSIS — J984 Other disorders of lung: Secondary | ICD-10-CM | POA: Diagnosis not present

## 2018-03-23 DIAGNOSIS — M1000 Idiopathic gout, unspecified site: Secondary | ICD-10-CM | POA: Diagnosis not present

## 2018-03-23 DIAGNOSIS — I619 Nontraumatic intracerebral hemorrhage, unspecified: Secondary | ICD-10-CM | POA: Diagnosis not present

## 2018-03-23 DIAGNOSIS — R159 Full incontinence of feces: Secondary | ICD-10-CM | POA: Diagnosis present

## 2018-03-23 DIAGNOSIS — I82403 Acute embolism and thrombosis of unspecified deep veins of lower extremity, bilateral: Secondary | ICD-10-CM | POA: Diagnosis present

## 2018-03-23 DIAGNOSIS — M6281 Muscle weakness (generalized): Secondary | ICD-10-CM | POA: Diagnosis not present

## 2018-03-23 DIAGNOSIS — I5022 Chronic systolic (congestive) heart failure: Secondary | ICD-10-CM | POA: Insufficient documentation

## 2018-03-23 DIAGNOSIS — I110 Hypertensive heart disease with heart failure: Secondary | ICD-10-CM

## 2018-03-23 DIAGNOSIS — R402240 Coma scale, best verbal response, confused conversation, unspecified time: Secondary | ICD-10-CM | POA: Diagnosis present

## 2018-03-23 DIAGNOSIS — I482 Chronic atrial fibrillation: Secondary | ICD-10-CM | POA: Diagnosis not present

## 2018-03-23 DIAGNOSIS — R4182 Altered mental status, unspecified: Secondary | ICD-10-CM | POA: Diagnosis not present

## 2018-03-23 DIAGNOSIS — I82493 Acute embolism and thrombosis of other specified deep vein of lower extremity, bilateral: Secondary | ICD-10-CM | POA: Diagnosis not present

## 2018-03-23 DIAGNOSIS — Z87820 Personal history of traumatic brain injury: Secondary | ICD-10-CM | POA: Diagnosis not present

## 2018-03-23 DIAGNOSIS — Z7901 Long term (current) use of anticoagulants: Secondary | ICD-10-CM | POA: Insufficient documentation

## 2018-03-23 DIAGNOSIS — R569 Unspecified convulsions: Secondary | ICD-10-CM | POA: Insufficient documentation

## 2018-03-23 DIAGNOSIS — I509 Heart failure, unspecified: Secondary | ICD-10-CM | POA: Diagnosis not present

## 2018-03-23 DIAGNOSIS — M79604 Pain in right leg: Secondary | ICD-10-CM | POA: Diagnosis not present

## 2018-03-23 DIAGNOSIS — F0390 Unspecified dementia without behavioral disturbance: Secondary | ICD-10-CM

## 2018-03-23 DIAGNOSIS — M109 Gout, unspecified: Secondary | ICD-10-CM | POA: Diagnosis present

## 2018-03-23 DIAGNOSIS — I82413 Acute embolism and thrombosis of femoral vein, bilateral: Secondary | ICD-10-CM | POA: Diagnosis not present

## 2018-03-23 DIAGNOSIS — E119 Type 2 diabetes mellitus without complications: Secondary | ICD-10-CM | POA: Diagnosis present

## 2018-03-23 DIAGNOSIS — Z9181 History of falling: Secondary | ICD-10-CM | POA: Diagnosis not present

## 2018-03-23 DIAGNOSIS — R402360 Coma scale, best motor response, obeys commands, unspecified time: Secondary | ICD-10-CM | POA: Diagnosis present

## 2018-03-23 DIAGNOSIS — D649 Anemia, unspecified: Secondary | ICD-10-CM | POA: Diagnosis present

## 2018-03-23 DIAGNOSIS — Z950 Presence of cardiac pacemaker: Secondary | ICD-10-CM | POA: Diagnosis not present

## 2018-03-23 DIAGNOSIS — R079 Chest pain, unspecified: Secondary | ICD-10-CM | POA: Diagnosis not present

## 2018-03-23 DIAGNOSIS — I5020 Unspecified systolic (congestive) heart failure: Secondary | ICD-10-CM | POA: Diagnosis present

## 2018-03-23 DIAGNOSIS — E785 Hyperlipidemia, unspecified: Secondary | ICD-10-CM | POA: Diagnosis present

## 2018-03-23 DIAGNOSIS — I82409 Acute embolism and thrombosis of unspecified deep veins of unspecified lower extremity: Secondary | ICD-10-CM | POA: Diagnosis not present

## 2018-03-23 DIAGNOSIS — Z8659 Personal history of other mental and behavioral disorders: Secondary | ICD-10-CM | POA: Diagnosis not present

## 2018-03-23 DIAGNOSIS — I2699 Other pulmonary embolism without acute cor pulmonale: Secondary | ICD-10-CM | POA: Diagnosis present

## 2018-03-23 DIAGNOSIS — Z515 Encounter for palliative care: Secondary | ICD-10-CM | POA: Diagnosis not present

## 2018-03-23 DIAGNOSIS — M79605 Pain in left leg: Secondary | ICD-10-CM | POA: Diagnosis not present

## 2018-03-23 DIAGNOSIS — I4891 Unspecified atrial fibrillation: Secondary | ICD-10-CM | POA: Diagnosis not present

## 2018-03-23 DIAGNOSIS — G40909 Epilepsy, unspecified, not intractable, without status epilepticus: Secondary | ICD-10-CM | POA: Diagnosis present

## 2018-03-23 DIAGNOSIS — I495 Sick sinus syndrome: Secondary | ICD-10-CM | POA: Diagnosis not present

## 2018-03-23 DIAGNOSIS — R32 Unspecified urinary incontinence: Secondary | ICD-10-CM | POA: Diagnosis present

## 2018-03-23 DIAGNOSIS — I82411 Acute embolism and thrombosis of right femoral vein: Secondary | ICD-10-CM | POA: Diagnosis not present

## 2018-03-23 DIAGNOSIS — M79606 Pain in leg, unspecified: Secondary | ICD-10-CM | POA: Diagnosis not present

## 2018-03-23 DIAGNOSIS — I10 Essential (primary) hypertension: Secondary | ICD-10-CM | POA: Diagnosis not present

## 2018-03-23 DIAGNOSIS — Z7401 Bed confinement status: Secondary | ICD-10-CM | POA: Diagnosis not present

## 2018-03-23 DIAGNOSIS — I481 Persistent atrial fibrillation: Secondary | ICD-10-CM | POA: Diagnosis present

## 2018-03-23 DIAGNOSIS — Z5189 Encounter for other specified aftercare: Secondary | ICD-10-CM | POA: Diagnosis not present

## 2018-03-23 DIAGNOSIS — R402140 Coma scale, eyes open, spontaneous, unspecified time: Secondary | ICD-10-CM | POA: Diagnosis present

## 2018-03-23 LAB — CBC WITH DIFFERENTIAL/PLATELET
BASOS PCT: 0 %
Basophils Absolute: 0 10*3/uL (ref 0–0.1)
Eosinophils Absolute: 0.2 10*3/uL (ref 0–0.7)
Eosinophils Relative: 2 %
HEMATOCRIT: 32.4 % — AB (ref 35.0–47.0)
Hemoglobin: 10.8 g/dL — ABNORMAL LOW (ref 12.0–16.0)
Lymphocytes Relative: 18 %
Lymphs Abs: 2.1 10*3/uL (ref 1.0–3.6)
MCH: 28.1 pg (ref 26.0–34.0)
MCHC: 33.4 g/dL (ref 32.0–36.0)
MCV: 84.2 fL (ref 80.0–100.0)
MONO ABS: 1.5 10*3/uL — AB (ref 0.2–0.9)
Monocytes Relative: 12 %
NEUTROS ABS: 8.1 10*3/uL — AB (ref 1.4–6.5)
Neutrophils Relative %: 68 %
Platelets: 231 10*3/uL (ref 150–440)
RBC: 3.85 MIL/uL (ref 3.80–5.20)
RDW: 14.1 % (ref 11.5–14.5)
WBC: 11.9 10*3/uL — ABNORMAL HIGH (ref 3.6–11.0)

## 2018-03-23 LAB — URINALYSIS, COMPLETE (UACMP) WITH MICROSCOPIC
Bilirubin Urine: NEGATIVE
Glucose, UA: NEGATIVE mg/dL
Hgb urine dipstick: NEGATIVE
Ketones, ur: NEGATIVE mg/dL
Leukocytes, UA: NEGATIVE
Nitrite: NEGATIVE
Protein, ur: 100 mg/dL — AB
SPECIFIC GRAVITY, URINE: 1.013 (ref 1.005–1.030)
pH: 5 (ref 5.0–8.0)

## 2018-03-23 LAB — COMPREHENSIVE METABOLIC PANEL
ALBUMIN: 2.7 g/dL — AB (ref 3.5–5.0)
ALK PHOS: 109 U/L (ref 38–126)
ALT: 19 U/L (ref 0–44)
AST: 28 U/L (ref 15–41)
Anion gap: 7 (ref 5–15)
BILIRUBIN TOTAL: 0.7 mg/dL (ref 0.3–1.2)
BUN: 21 mg/dL (ref 8–23)
CALCIUM: 8.3 mg/dL — AB (ref 8.9–10.3)
CO2: 31 mmol/L (ref 22–32)
Chloride: 101 mmol/L (ref 98–111)
Creatinine, Ser: 1.39 mg/dL — ABNORMAL HIGH (ref 0.44–1.00)
GFR calc Af Amer: 39 mL/min — ABNORMAL LOW (ref >60)
GFR, EST NON AFRICAN AMERICAN: 34 mL/min — AB (ref >60)
Glucose, Bld: 80 mg/dL (ref 70–99)
Potassium: 4.2 mmol/L (ref 3.5–5.1)
Sodium: 139 mmol/L (ref 135–145)
Total Protein: 6.7 g/dL (ref 6.5–8.1)

## 2018-03-23 MED ORDER — SODIUM CHLORIDE 0.9 % IV SOLN
Freq: Once | INTRAVENOUS | Status: AC
  Administered 2018-03-23: 18:00:00 via INTRAVENOUS

## 2018-03-23 NOTE — ED Triage Notes (Signed)
Pt presents to ED via [facility] from home c/o seizure witnessed by family. Pt has hx dementia and is unable to answer questions about seizure at this time. Family on the way per EMS. EMS states pt takes Keppra.

## 2018-03-23 NOTE — ED Notes (Signed)

## 2018-03-23 NOTE — ED Provider Notes (Addendum)
[facility] Emergency Department Provider Note   ____________________________________________   First MD Initiated Contact with Patient 03/23/18 1616     (approximate)  I have reviewed the triage vital signs and the nursing notes.   HISTORY  Chief Complaint Seizures    HPI Shelley Cameron is a 84 y.o. female who had a seizure at home witnessed by her family.  She has dementia and really cannot answer questions very well.  Family reports that she has had occasional seizures and usually has her Keppra dose increased last increase was to 750 twice a day.. Patient has returned back to her baseline.  She is easily arousable although resting moves all her extremities equally well.  I discussed with Dr. Irish Elders who recommended increasing her 2000 mg twice a day.  She sees Dr. Melrose Nakayama I will have them follow-up with her per Dr. Marion Downer recommendation.   Past Medical History:  Diagnosis Date  . A-fib ([place])   . Anemia   . Arthritis   . Cancer ([place]) 1998, 1999   breast cancer. Bilateral mastectomy.   . Diabetes mellitus without complication ([place])   . DJD (degenerative joint disease)   . Gout   . Heart murmur   . Hyperlipidemia   . Hypertension   . Lymphedema of right arm   . Moderate dementia   . Seizures ([place])    Tonic-Clonic seizure activity  . Severe mitral insufficiency   . Stroke [facility]) 2011  . Syncope   . Systolic CHF ([place])   . Urinary incontinence     Patient Active Problem List   Diagnosis Date Noted  . Anemia, unspecified 02/23/2018  . Arthritis 02/23/2018  . Congestive heart failure with left ventricular systolic dysfunction ([place]) 02/23/2018  . Diabetes mellitus type 2, uncomplicated ([place]) 54/27/0623  . Gout 02/23/2018  . Cause of injury, fall 02/13/2018  . Cardiac syncope 11/26/2017  . Sick sinus syndrome ([place]) 08/01/2016  . Chest wall pain 05/30/2016  . B12 deficiency 04/25/2016  . Edema of right lower extremity 01/10/2016  .  Chronic a-fib ([place]) 09/07/2014    Past Surgical History:  Procedure Laterality Date  . BREAST SURGERY Left 08/1997   Dr Smith/mastectomy  . CATARACT EXTRACTION  2000  . EYE SURGERY Bilateral    Cataract Extraction with IOL  . MASTECTOMY Right 02/19/1997   Dr Bary Castilla  . MASTECTOMY Left 1999   Dr. Rochel Brome, [facility]  . PACEMAKER INSERTION Left 08/01/2016   Procedure: INSERTION PACEMAKER;  Surgeon: Isaias Cowman, MD;  Location: [facility] ORS;  Service: Cardiovascular;  Laterality: Left;    Prior to Admission medications   Medication Sig Start Date End Date Taking? Authorizing Provider  acetaminophen (TYLENOL) 500 MG tablet Take 1,000 mg by mouth every 6 (six) hours as needed for mild pain.    [provider]  cephALEXin (KEFLEX) 500 MG capsule Take 1 capsule (500 mg total) by mouth 2 (two) times daily. 03/06/18   Carrie Mew, MD  cetirizine (ZYRTEC) 10 MG tablet Take 10 mg by mouth at bedtime.    [provider]  Cholecalciferol (VITAMIN D) 2000 units CAPS Take 2,000 Units by mouth daily.     [provider]  colchicine 0.6 MG tablet Take 0.6 mg by mouth as needed (Gout).     [provider]  conjugated estrogens (PREMARIN) vaginal cream Apply 0.5mg  (pea-sized amount)  just inside the vaginal introitus with a finger-tip on  Monday, Wednesday and Friday nights. 02/28/18   McGowan,  Shannon A, PA-C  donepezil (ARICEPT) 10 MG tablet Take 10 mg by mouth at bedtime.     [provider]  estradiol (ESTRACE VAGINAL) 0.1 MG/GM vaginal cream Apply 0.5mg  (pea-sized amount)  just inside the vaginal introitus with a finger-tip on Monday, Wednesday and Friday nights. Patient not taking: Reported on 03/06/2018 02/28/18   Zara Council A, PA-C  lamoTRIgine (LAMICTAL) 25 MG tablet Take 25 mg by mouth 2 (two) times daily.  01/11/18   [provider]  loratadine (CLARITIN) 10 MG tablet Take 10 mg by mouth daily as needed for allergies.     [provider]  lovastatin (MEVACOR) 40 MG tablet Take 40 mg by mouth at bedtime.    [provider]  memantine (NAMENDA) 5 MG tablet Take 5 mg by mouth 2 (two) times daily. 12/24/17   [provider]  QUEtiapine (SEROQUEL) 50 MG tablet Take 50 mg by mouth at bedtime. 10/30/17   [provider]  Rivaroxaban (XARELTO) 15 MG TABS tablet Take 15 mg by mouth daily with supper.     [provider]  venlafaxine XR (EFFEXOR-XR) 37.5 MG 24 hr capsule Take 37.5 mg by mouth daily. 12/31/17   [provider]  vitamin B-12 (CYANOCOBALAMIN) 1000 MCG tablet Take 1,000 mcg by mouth daily.    [provider]    Allergies Codeine  Family History  Problem Relation Age of Onset  . Hypertension Father     Social History Social History   Tobacco Use  . Smoking status: Never Smoker  . Smokeless tobacco: Never Used  Substance Use Topics  . Alcohol use: No  . Drug use: No    Review of Systems Review of systems is approximate based on patient's answers through her dementia. Constitutional: No fever/chills Eyes: No visual changes. ENT: No sore throat. Cardiovascular: Denies chest pain. Respiratory: Denies shortness of breath. Gastrointestinal: No abdominal pain.  No nausea, no vomiting.  No diarrhea.  No constipation. Genitourinary: Negative for dysuria. Musculoskeletal: Negative for back pain. Skin: Negative for rash. Neurological: Negative for headaches, focal weakness   ____________________________________________   PHYSICAL EXAM:  VITAL SIGNS: ED Triage Vitals [03/23/18 1608]  Enc Vitals Group     BP 114/68     Pulse Rate 78     Resp 18     Temp 98.9 F (37.2 C)     Temp Source Oral     SpO2 100 %     Weight 152 lb (68.9 kg)     Height 5\' 3"  (1.6 m)     Head Circumference      Peak Flow      Pain Score      Pain Loc      Pain Edu?      Excl. in [place]?     Constitutional: Resting it easily arousable. Well appearing and in no  acute distress. Eyes: Conjunctivae are normal. PERRL. EOMI. Head: Atraumatic. Nose: No congestion/rhinnorhea. Mouth/Throat: Mucous membranes are moist.  Oropharynx non-erythematous. Neck: No stridor.   Cardiovascular: Normal rate, regular rhythm. Grossly normal heart sounds.  Good peripheral circulation. Respiratory: Normal respiratory effort.  No retractions. Lungs CTAB. Gastrointestinal: Soft and nontender. No distention. No abdominal bruits. No CVA tenderness. }Musculoskeletal: No lower extremity tenderness nor edema.  No joint effusions. Neurologic:  Normal speech and language. No gross focal neurologic deficits are appreciated.  Moves all extremities equally and well follows commands well Skin:  Skin is warm, dry and intact. No rash noted. Psychiatric:  Mood and affect are normal. Speech and behavior are normal.  ____________________________________________   LABS (all labs ordered are listed, but only abnormal results are displayed)  Labs Reviewed  COMPREHENSIVE METABOLIC PANEL - Abnormal; Notable for the following components:      Result Value   Creatinine, Ser 1.39 (*)    Calcium 8.3 (*)    Albumin 2.7 (*)    GFR calc non Af Amer 34 (*)    GFR calc Af Amer 39 (*)    All other components within normal limits  CBC WITH DIFFERENTIAL/PLATELET - Abnormal; Notable for the following components:   WBC 11.9 (*)    Hemoglobin 10.8 (*)    HCT 32.4 (*)    Neutro Abs 8.1 (*)    Monocytes Absolute 1.5 (*)    All other components within normal limits  URINALYSIS, COMPLETE (UACMP) WITH MICROSCOPIC - Abnormal; Notable for the following components:   Color, Urine YELLOW (*)    APPearance HAZY (*)    Protein, ur 100 (*)    Bacteria, UA RARE (*)    All other components within normal limits  LEVETIRACETAM LEVEL   ____________________________________________  EKG  EKG read and interpreted by me shows atrial fib flutter mostly paced or a few non-paced beats EKG looks similar to one  done in May 2019.  The paced beats have some ST segment elevation which are present in the old EKG as well. ____________________________________________  RADIOLOGY  ED MD interpretation CT read by radiology shows no acute disease  Official radiology report(s): Ct Head Wo Contrast  Result Date: 03/23/2018 CLINICAL DATA:  History of dementia.  Seizure. EXAM: CT HEAD WITHOUT CONTRAST TECHNIQUE: Contiguous axial images were obtained from the base of the skull through the vertex without intravenous contrast. COMPARISON:  CT scan March 06, 2018 FINDINGS: Brain: No subdural, epidural, or subarachnoid hemorrhage. Cerebellum, brainstem, and basal cisterns are normal. Ventricles and sulci are prominent but stable. White matter changes are moderate to severe and stable. No acute cortical ischemia or infarct identified. No mass effect or midline shift. Vascular: No hyperdense vessel or unexpected calcification. Skull: Normal. Negative for fracture or focal lesion. Sinuses/Orbits: No acute finding. Other: None. IMPRESSION: Volume loss with prominent sulci and ventricles, stable. Stable white matter changes. No acute intracranial abnormality. Electronically Signed   By: Dorise Bullion III M.D   On: 03/23/2018 17:00   Dg Chest Portable 1 View  Result Date: 03/23/2018 CLINICAL DATA:  Seizure.  Dementia. EXAM: PORTABLE CHEST 1 VIEW COMPARISON:  Jan 13, 2018 FINDINGS: The heart size borderline. Stable pacemaker. No pneumothorax. The lungs are clear. The mediastinum is unchanged. IMPRESSION: No acute interval change. Electronically Signed   By: Dorise Bullion III M.D   On: 03/23/2018 17:46    ____________________________________________   PROCEDURES  Procedure(s) performed:   Procedures  Critical Care performed:   ____________________________________________   INITIAL IMPRESSION / ASSESSMENT AND PLAN / ED COURSE  Patient with an isolated seizure will increase her Keppra and have her follow-up with her  doctor as recommended by neurologist on call Dr. Irish Elders   We will also have her cut her metoprolol in half to take 12-1/2 mg twice a day since her blood pressure was on the low side here.     ____________________________________________   FINAL CLINICAL IMPRESSION(S) / ED DIAGNOSES  Final diagnoses:  Seizure [facility])     ED Discharge Orders    None       Note:  This document was prepared  using Systems analyst and may include unintentional dictation errors.    Nena Polio, MD 03/23/18 1925    Nena Polio, MD 03/23/18 2032

## 2018-03-23 NOTE — Discharge Instructions (Addendum)
Increase the Keppra to 1000 mg twice a day.  Please follow-up with Dr. Melrose Nakayama.  Please call him tomorrow morning to arrange follow-up.  Please return here for any further problems.

## 2018-03-25 ENCOUNTER — Emergency Department: Payer: Medicare HMO

## 2018-03-25 ENCOUNTER — Inpatient Hospital Stay
Admission: EM | Admit: 2018-03-25 | Discharge: 2018-03-28 | DRG: 252 | Disposition: A | Discharge status: Skilled Nursing Facility | Payer: Medicare HMO | Attending: Specialist | Admitting: Specialist

## 2018-03-25 ENCOUNTER — Other Ambulatory Visit: Payer: Self-pay

## 2018-03-25 ENCOUNTER — Encounter: Payer: Self-pay | Admitting: Emergency Medicine

## 2018-03-25 ENCOUNTER — Inpatient Hospital Stay: Payer: Medicare HMO

## 2018-03-25 DIAGNOSIS — G9340 Encephalopathy, unspecified: Secondary | ICD-10-CM

## 2018-03-25 DIAGNOSIS — I481 Persistent atrial fibrillation: Secondary | ICD-10-CM | POA: Diagnosis present

## 2018-03-25 DIAGNOSIS — E785 Hyperlipidemia, unspecified: Secondary | ICD-10-CM | POA: Diagnosis present

## 2018-03-25 DIAGNOSIS — R402140 Coma scale, eyes open, spontaneous, unspecified time: Secondary | ICD-10-CM | POA: Diagnosis present

## 2018-03-25 DIAGNOSIS — Z7189 Other specified counseling: Secondary | ICD-10-CM | POA: Diagnosis not present

## 2018-03-25 DIAGNOSIS — I509 Heart failure, unspecified: Secondary | ICD-10-CM | POA: Diagnosis not present

## 2018-03-25 DIAGNOSIS — R402360 Coma scale, best motor response, obeys commands, unspecified time: Secondary | ICD-10-CM | POA: Diagnosis present

## 2018-03-25 DIAGNOSIS — R402240 Coma scale, best verbal response, confused conversation, unspecified time: Secondary | ICD-10-CM | POA: Diagnosis present

## 2018-03-25 DIAGNOSIS — R159 Full incontinence of feces: Secondary | ICD-10-CM | POA: Diagnosis present

## 2018-03-25 DIAGNOSIS — Z66 Do not resuscitate: Secondary | ICD-10-CM | POA: Diagnosis not present

## 2018-03-25 DIAGNOSIS — Z9181 History of falling: Secondary | ICD-10-CM | POA: Diagnosis not present

## 2018-03-25 DIAGNOSIS — R32 Unspecified urinary incontinence: Secondary | ICD-10-CM | POA: Diagnosis present

## 2018-03-25 DIAGNOSIS — M109 Gout, unspecified: Secondary | ICD-10-CM | POA: Diagnosis present

## 2018-03-25 DIAGNOSIS — I5020 Unspecified systolic (congestive) heart failure: Secondary | ICD-10-CM | POA: Diagnosis present

## 2018-03-25 DIAGNOSIS — F0390 Unspecified dementia without behavioral disturbance: Secondary | ICD-10-CM | POA: Diagnosis present

## 2018-03-25 DIAGNOSIS — E119 Type 2 diabetes mellitus without complications: Secondary | ICD-10-CM | POA: Diagnosis present

## 2018-03-25 DIAGNOSIS — I2699 Other pulmonary embolism without acute cor pulmonale: Secondary | ICD-10-CM | POA: Diagnosis present

## 2018-03-25 DIAGNOSIS — I82403 Acute embolism and thrombosis of unspecified deep veins of lower extremity, bilateral: Secondary | ICD-10-CM | POA: Diagnosis present

## 2018-03-25 DIAGNOSIS — I340 Nonrheumatic mitral (valve) insufficiency: Secondary | ICD-10-CM | POA: Diagnosis present

## 2018-03-25 DIAGNOSIS — Z950 Presence of cardiac pacemaker: Secondary | ICD-10-CM

## 2018-03-25 DIAGNOSIS — Z515 Encounter for palliative care: Secondary | ICD-10-CM | POA: Diagnosis not present

## 2018-03-25 DIAGNOSIS — Z79899 Other long term (current) drug therapy: Secondary | ICD-10-CM

## 2018-03-25 DIAGNOSIS — R079 Chest pain, unspecified: Secondary | ICD-10-CM | POA: Diagnosis not present

## 2018-03-25 DIAGNOSIS — I10 Essential (primary) hypertension: Secondary | ICD-10-CM | POA: Diagnosis not present

## 2018-03-25 DIAGNOSIS — G40909 Epilepsy, unspecified, not intractable, without status epilepticus: Secondary | ICD-10-CM | POA: Diagnosis present

## 2018-03-25 DIAGNOSIS — Z7401 Bed confinement status: Secondary | ICD-10-CM | POA: Diagnosis not present

## 2018-03-25 DIAGNOSIS — I82493 Acute embolism and thrombosis of other specified deep vein of lower extremity, bilateral: Secondary | ICD-10-CM | POA: Diagnosis not present

## 2018-03-25 DIAGNOSIS — G9341 Metabolic encephalopathy: Secondary | ICD-10-CM | POA: Diagnosis present

## 2018-03-25 DIAGNOSIS — I495 Sick sinus syndrome: Secondary | ICD-10-CM | POA: Diagnosis not present

## 2018-03-25 DIAGNOSIS — I82409 Acute embolism and thrombosis of unspecified deep veins of unspecified lower extremity: Secondary | ICD-10-CM | POA: Diagnosis not present

## 2018-03-25 DIAGNOSIS — I619 Nontraumatic intracerebral hemorrhage, unspecified: Secondary | ICD-10-CM | POA: Diagnosis not present

## 2018-03-25 DIAGNOSIS — M79604 Pain in right leg: Secondary | ICD-10-CM | POA: Diagnosis not present

## 2018-03-25 DIAGNOSIS — D649 Anemia, unspecified: Secondary | ICD-10-CM | POA: Diagnosis present

## 2018-03-25 DIAGNOSIS — I110 Hypertensive heart disease with heart failure: Secondary | ICD-10-CM | POA: Diagnosis present

## 2018-03-25 DIAGNOSIS — Z87820 Personal history of traumatic brain injury: Secondary | ICD-10-CM | POA: Diagnosis not present

## 2018-03-25 DIAGNOSIS — I482 Chronic atrial fibrillation: Secondary | ICD-10-CM | POA: Diagnosis not present

## 2018-03-25 DIAGNOSIS — M79605 Pain in left leg: Secondary | ICD-10-CM | POA: Diagnosis not present

## 2018-03-25 DIAGNOSIS — O2230 Deep phlebothrombosis in pregnancy, unspecified trimester: Secondary | ICD-10-CM | POA: Diagnosis present

## 2018-03-25 DIAGNOSIS — I4891 Unspecified atrial fibrillation: Secondary | ICD-10-CM | POA: Diagnosis not present

## 2018-03-25 DIAGNOSIS — Z885 Allergy status to narcotic agent status: Secondary | ICD-10-CM

## 2018-03-25 LAB — CBC
HCT: 34.1 % — ABNORMAL LOW (ref 35.0–47.0)
Hemoglobin: 11.2 g/dL — ABNORMAL LOW (ref 12.0–16.0)
MCH: 27.7 pg (ref 26.0–34.0)
MCHC: 33 g/dL (ref 32.0–36.0)
MCV: 84 fL (ref 80.0–100.0)
PLATELETS: 232 10*3/uL (ref 150–440)
RBC: 4.05 MIL/uL (ref 3.80–5.20)
RDW: 14.1 % (ref 11.5–14.5)
WBC: 11.3 10*3/uL — AB (ref 3.6–11.0)

## 2018-03-25 LAB — URINALYSIS, COMPLETE (UACMP) WITH MICROSCOPIC
BACTERIA UA: NONE SEEN
Bilirubin Urine: NEGATIVE
Glucose, UA: NEGATIVE mg/dL
HGB URINE DIPSTICK: NEGATIVE
Ketones, ur: NEGATIVE mg/dL
LEUKOCYTES UA: NEGATIVE
Nitrite: NEGATIVE
PROTEIN: 100 mg/dL — AB
SPECIFIC GRAVITY, URINE: 1.011 (ref 1.005–1.030)
pH: 6 (ref 5.0–8.0)

## 2018-03-25 LAB — COMPREHENSIVE METABOLIC PANEL
ALBUMIN: 2.9 g/dL — AB (ref 3.5–5.0)
ALT: 26 U/L (ref 0–44)
AST: 36 U/L (ref 15–41)
Alkaline Phosphatase: 116 U/L (ref 38–126)
Anion gap: 8 (ref 5–15)
BUN: 17 mg/dL (ref 8–23)
CHLORIDE: 105 mmol/L (ref 98–111)
CO2: 28 mmol/L (ref 22–32)
CREATININE: 1.24 mg/dL — AB (ref 0.44–1.00)
Calcium: 8.5 mg/dL — ABNORMAL LOW (ref 8.9–10.3)
GFR calc Af Amer: 45 mL/min — ABNORMAL LOW (ref >60)
GFR calc non Af Amer: 38 mL/min — ABNORMAL LOW (ref >60)
Glucose, Bld: 103 mg/dL — ABNORMAL HIGH (ref 70–99)
POTASSIUM: 4.5 mmol/L (ref 3.5–5.1)
Sodium: 141 mmol/L (ref 135–145)
Total Bilirubin: 0.9 mg/dL (ref 0.3–1.2)
Total Protein: 6.9 g/dL (ref 6.5–8.1)

## 2018-03-25 LAB — CK: CK TOTAL: 61 U/L (ref 38–234)

## 2018-03-25 LAB — MAGNESIUM: MAGNESIUM: 2.1 mg/dL (ref 1.7–2.4)

## 2018-03-25 LAB — TROPONIN I: Troponin I: <0.03 ng/mL (ref <0.03)

## 2018-03-25 LAB — LEVETIRACETAM LEVEL: Levetiracetam Lvl: 53 ug/mL — ABNORMAL HIGH (ref 10.0–40.0)

## 2018-03-25 MED ORDER — IOPAMIDOL (ISOVUE-370) INJECTION 76%
60.0000 mL | Freq: Once | INTRAVENOUS | Status: AC | PRN
  Administered 2018-03-25: 60 mL via INTRAVENOUS

## 2018-03-25 MED ORDER — QUETIAPINE FUMARATE 25 MG PO TABS
50.0000 mg | ORAL_TABLET | Freq: Every day | ORAL | Status: DC
  Administered 2018-03-25 – 2018-03-27 (×3): 50 mg via ORAL
  Filled 2018-03-25 (×3): qty 2

## 2018-03-25 MED ORDER — ACETAMINOPHEN 650 MG RE SUPP: 650.0000 mg | Freq: Four times a day (QID) | RECTAL | Status: DC | PRN

## 2018-03-25 MED ORDER — LORATADINE 10 MG PO TABS
10.0000 mg | ORAL_TABLET | Freq: Every day | ORAL | Status: DC
  Administered 2018-03-25 – 2018-03-28 (×3): 10 mg via ORAL
  Filled 2018-03-25 (×3): qty 1

## 2018-03-25 MED ORDER — VENLAFAXINE HCL ER 37.5 MG PO CP24
37.5000 mg | ORAL_CAPSULE | Freq: Every day | ORAL | Status: DC
  Administered 2018-03-25 – 2018-03-28 (×3): 37.5 mg via ORAL
  Filled 2018-03-25 (×4): qty 1

## 2018-03-25 MED ORDER — ACETAMINOPHEN 325 MG PO TABS
650.0000 mg | ORAL_TABLET | Freq: Four times a day (QID) | ORAL | Status: DC | PRN
  Administered 2018-03-26 – 2018-03-28 (×3): 650 mg via ORAL
  Filled 2018-03-25 (×3): qty 2

## 2018-03-25 MED ORDER — ACETAMINOPHEN 325 MG PO TABS
650.0000 mg | ORAL_TABLET | Freq: Once | ORAL | Status: AC
  Administered 2018-03-25: 650 mg via ORAL
  Filled 2018-03-25: qty 2

## 2018-03-25 MED ORDER — POLYETHYLENE GLYCOL 3350 17 G PO PACK
17.0000 g | PACK | Freq: Every day | ORAL | Status: DC
  Administered 2018-03-25 – 2018-03-28 (×3): 17 g via ORAL
  Filled 2018-03-25 (×3): qty 1

## 2018-03-25 MED ORDER — SENNOSIDES-DOCUSATE SODIUM 8.6-50 MG PO TABS
1.0000 | ORAL_TABLET | Freq: Every evening | ORAL | Status: DC | PRN
  Administered 2018-03-27: 1 via ORAL
  Filled 2018-03-25: qty 1

## 2018-03-25 MED ORDER — VITAMIN B-12 1000 MCG PO TABS
1000.0000 ug | ORAL_TABLET | Freq: Every day | ORAL | Status: DC
Start: 2018-03-25 — End: 2018-03-28
  Administered 2018-03-25 – 2018-03-28 (×3): 1000 ug via ORAL
  Filled 2018-03-25 (×3): qty 1

## 2018-03-25 MED ORDER — DONEPEZIL HCL 5 MG PO TABS
10.0000 mg | ORAL_TABLET | Freq: Every day | ORAL | Status: DC
  Administered 2018-03-25 – 2018-03-27 (×3): 10 mg via ORAL
  Filled 2018-03-25 (×4): qty 2

## 2018-03-25 MED ORDER — METOPROLOL TARTRATE 25 MG PO TABS
12.5000 mg | ORAL_TABLET | Freq: Two times a day (BID) | ORAL | Status: DC
  Administered 2018-03-25 – 2018-03-28 (×5): 12.5 mg via ORAL
  Filled 2018-03-25 (×5): qty 1

## 2018-03-25 MED ORDER — CEFAZOLIN SODIUM-DEXTROSE 2-4 GM/100ML-% IV SOLN
2.0000 g | INTRAVENOUS | Status: AC
  Administered 2018-03-26: 2 g via INTRAVENOUS
  Filled 2018-03-25 (×2): qty 100

## 2018-03-25 MED ORDER — ONDANSETRON HCL 4 MG PO TABS
4.0000 mg | ORAL_TABLET | Freq: Four times a day (QID) | ORAL | Status: DC | PRN
Start: 2018-03-25 — End: 2018-03-28

## 2018-03-25 MED ORDER — BISACODYL 5 MG PO TBEC
5.0000 mg | DELAYED_RELEASE_TABLET | Freq: Every day | ORAL | Status: DC | PRN
  Administered 2018-03-27 – 2018-03-28 (×2): 5 mg via ORAL
  Filled 2018-03-25 (×2): qty 1

## 2018-03-25 MED ORDER — LAMOTRIGINE 25 MG PO TABS
25.0000 mg | ORAL_TABLET | Freq: Two times a day (BID) | ORAL | Status: DC
  Administered 2018-03-25 – 2018-03-28 (×5): 25 mg via ORAL
  Filled 2018-03-25 (×5): qty 1

## 2018-03-25 MED ORDER — MEMANTINE HCL 5 MG PO TABS
5.0000 mg | ORAL_TABLET | Freq: Two times a day (BID) | ORAL | Status: DC
  Administered 2018-03-25 – 2018-03-28 (×5): 5 mg via ORAL
  Filled 2018-03-25 (×5): qty 1

## 2018-03-25 MED ORDER — SODIUM CHLORIDE 0.9 % IV SOLN
INTRAVENOUS | Status: DC
  Administered 2018-03-25: 17:00:00 via INTRAVENOUS

## 2018-03-25 MED ORDER — PNEUMOCOCCAL VAC POLYVALENT 25 MCG/0.5ML IJ INJ: 0.5000 mL | INJECTION | INTRAMUSCULAR | Status: DC

## 2018-03-25 MED ORDER — LEVETIRACETAM 750 MG PO TABS
750.0000 mg | ORAL_TABLET | Freq: Two times a day (BID) | ORAL | Status: DC
  Administered 2018-03-25 – 2018-03-28 (×5): 750 mg via ORAL
  Filled 2018-03-25 (×7): qty 1

## 2018-03-25 MED ORDER — ALBUTEROL SULFATE (2.5 MG/3ML) 0.083% IN NEBU: 2.5000 mg | INHALATION_SOLUTION | RESPIRATORY_TRACT | Status: DC | PRN

## 2018-03-25 MED ORDER — PRAVASTATIN SODIUM 20 MG PO TABS
40.0000 mg | ORAL_TABLET | Freq: Every day | ORAL | Status: DC
  Administered 2018-03-25 – 2018-03-27 (×3): 40 mg via ORAL
  Filled 2018-03-25 (×3): qty 2

## 2018-03-25 MED ORDER — COLCHICINE 0.6 MG PO TABS
0.6000 mg | ORAL_TABLET | Freq: Every day | ORAL | Status: DC | PRN
Start: 2018-03-25 — End: 2018-03-28
  Administered 2018-03-25 – 2018-03-27 (×2): 0.6 mg via ORAL
  Filled 2018-03-25 (×3): qty 1

## 2018-03-25 MED ORDER — ONDANSETRON HCL 4 MG/2ML IJ SOLN
4.0000 mg | Freq: Four times a day (QID) | INTRAMUSCULAR | Status: DC | PRN
  Administered 2018-03-26: 4 mg via INTRAVENOUS
  Filled 2018-03-25: qty 2

## 2018-03-25 NOTE — Consult Note (Signed)
[facility] Consult Note  MRN : [id number]  Shelley Cameron is a 84 y.o. (March 05, 1930) female who presents with chief complaint of  Chief Complaint  Patient presents with  . Leg Pain   History of Present Illness:  The patient is an 84 year old female with a past medical history of sick sinus syndrome, anemia, chronic A. fib, congestive heart failure, diabetes mellitus type 2, hyperlipidemia, hypertension, severe mitral insufficiency, stroke, and dementia.  Most of the history was obtained by the patient's daughter who was present at the bedside.  The patient's daughter notes that her mother was complaining of bilateral lower extremity pain for the last 3 to 4 days.  The patient's discomfort progressed to the point that she was not able to walk.  The daughter also noticed a decline in the patient's mental status which prompted her to seek medical attention at [facility] emergency department.  Venous duplex on 03/25/18, was notable for "positive for extensive occlusive acute DVT in the right lower extremity beginning in the common femoral vein inferior to the origin of the great saphenous vein and extending throughout the femoral vein in the thigh and into the popliteal vein. Positive for occlusive acute DVT in the left lower extremity beginning in the femoral vein in the distal thigh and extending into the popliteal vein".   CTA chest on 03/25/18, was notable for "Changes of pulmonary emboli primarily in the right lower lobe as described without evidence of right heart strain".  Heparin drip was not started as the patient endorses a recent history of the patient falling and experiencing a "intracranial bleed".  Was on Xarelto her walker has been stopped due to this bleed.  The patient has dementia and is not ambulatory.  Patient is relatively bedbound.  The patient is resting comfortably in bed and does not seem to be short of breath or any type  of pain.  Patient does not have any fever, nausea vomiting  Vascular surgery was consulted by Dr. Bridgett Larsson for IVC filter placement  Current Facility-Administered Medications  Medication Dose Route Frequency Provider Last Rate Last Dose  . 0.9 %  sodium chloride infusion   Intravenous Continuous Demetrios Loll, MD 50 mL/hr at 03/25/18 1703    . acetaminophen (TYLENOL) tablet 650 mg  650 mg Oral Q6H PRN Demetrios Loll, MD       Or  . acetaminophen (TYLENOL) suppository 650 mg  650 mg Rectal Q6H PRN Demetrios Loll, MD      . albuterol (PROVENTIL) (2.5 MG/3ML) 0.083% nebulizer solution 2.5 mg  2.5 mg Nebulization Q2H PRN Demetrios Loll, MD      . bisacodyl (DULCOLAX) EC tablet 5 mg  5 mg Oral Daily PRN Demetrios Loll, MD      . colchicine tablet 0.6 mg  0.6 mg Oral Daily PRN Demetrios Loll, MD   0.6 mg at 03/25/18 1815  . donepezil (ARICEPT) tablet 10 mg  10 mg Oral QHS Demetrios Loll, MD      . lamoTRIgine (LAMICTAL) tablet 25 mg  25 mg Oral BID Demetrios Loll, MD      . levETIRAcetam (KEPPRA) tablet 750 mg  750 mg Oral BID Demetrios Loll, MD      . loratadine (CLARITIN) tablet 10 mg  10 mg Oral Daily Demetrios Loll, MD   10 mg at 03/25/18 1814  . memantine (NAMENDA) tablet 5 mg  5 mg Oral BID Demetrios Loll, MD      . metoprolol  tartrate (LOPRESSOR) tablet 12.5 mg  12.5 mg Oral BID Demetrios Loll, MD      . ondansetron [facility]) tablet 4 mg  4 mg Oral Q6H PRN Demetrios Loll, MD       Or  . ondansetron [facility]) injection 4 mg  4 mg Intravenous Q6H PRN Demetrios Loll, MD      . Derrill Memo ON 03/26/2018] pneumococcal 23 valent vaccine (PNU-IMMUNE) injection 0.5 mL  0.5 mL Intramuscular Tomorrow-1000 Demetrios Loll, MD      . polyethylene glycol (MIRALAX / GLYCOLAX) packet 17 g  17 g Oral Daily Demetrios Loll, MD   17 g at 03/25/18 1814  . pravastatin (PRAVACHOL) tablet 40 mg  40 mg Oral q1800 Demetrios Loll, MD   40 mg at 03/25/18 1814  . QUEtiapine (SEROQUEL) tablet 50 mg  50 mg Oral QHS Demetrios Loll, MD      . senna-docusate (Senokot-S) tablet 1 tablet  1 tablet Oral QHS  PRN Demetrios Loll, MD      . venlafaxine XR (EFFEXOR-XR) 24 hr capsule 37.5 mg  37.5 mg Oral Daily Demetrios Loll, MD   37.5 mg at 03/25/18 1814  . vitamin B-12 (CYANOCOBALAMIN) tablet 1,000 mcg  1,000 mcg Oral Daily Demetrios Loll, MD   1,000 mcg at 03/25/18 1814   Past Medical History:  Diagnosis Date  . A-fib ([place])   . Anemia   . Arthritis   . Cancer ([place]) 1998, 1999   breast cancer. Bilateral mastectomy.   . Diabetes mellitus without complication ([place])   . DJD (degenerative joint disease)   . Gout   . Heart murmur   . Hyperlipidemia   . Hypertension   . Lymphedema of right arm   . Moderate dementia   . Seizures ([place])    Tonic-Clonic seizure activity  . Severe mitral insufficiency   . Stroke [facility]) 2011  . Syncope   . Systolic CHF ([place])   . Urinary incontinence    Past Surgical History:  Procedure Laterality Date  . BREAST SURGERY Left 08/1997   Dr Smith/mastectomy  . CATARACT EXTRACTION  2000  . EYE SURGERY Bilateral    Cataract Extraction with IOL  . MASTECTOMY Right 02/19/1997   Dr Bary Castilla  . MASTECTOMY Left 1999   Dr. Rochel Brome, [facility]  . PACEMAKER INSERTION Left 08/01/2016   Procedure: INSERTION PACEMAKER;  Surgeon: Isaias Cowman, MD;  Location: [facility] ORS;  Service: Cardiovascular;  Laterality: Left;   Social History Social History   Tobacco Use  . Smoking status: Never Smoker  . Smokeless tobacco: Never Used  Substance Use Topics  . Alcohol use: No  . Drug use: No   Family History Family History  Problem Relation Age of Onset  . Hypertension Father   Denies family history of arterial, venous or renal disease.  Allergies  Allergen Reactions  . Codeine Nausea Only   REVIEW OF SYSTEMS (Negative unless checked)  Constitutional: [] Weight loss  [] Fever  [] Chills Cardiac: [] Chest pain   [] Chest pressure   [] Palpitations   [] Shortness of breath when laying flat   [] Shortness of breath at rest   [] Shortness of breath with exertion. Vascular:  [x] Pain in legs  with walking   [x] Pain in legs at rest   [x] Pain in legs when laying flat   [] Claudication   [] Pain in feet when walking  [] Pain in feet at rest  [] Pain in feet when laying flat   [] History of DVT   [] Phlebitis   [x] Swelling in legs   [] Varicose  veins   [] Non-healing ulcers Pulmonary:   [] Uses home oxygen   [] Productive cough   [] Hemoptysis   [] Wheeze  [] COPD   [] Asthma Neurologic:  [] Dizziness  [] Blackouts   [] Seizures   [x] History of stroke   [] History of TIA  [] Aphasia   [] Temporary blindness   [] Dysphagia   [] Weakness or numbness in arms   [] Weakness or numbness in legs Musculoskeletal:  [] Arthritis   [] Joint swelling   [] Joint pain   [] Low back pain Hematologic:  [] Easy bruising  [] Easy bleeding   [] Hypercoagulable state   [x] Anemic  [] Hepatitis Gastrointestinal:  [] Blood in stool   [] Vomiting blood  [] Gastroesophageal reflux/heartburn   [] Difficulty swallowing. Genitourinary:  [] Chronic kidney disease   [] Difficult urination  [] Frequent urination  [] Burning with urination   [] Blood in urine Skin:  [] Rashes   [] Ulcers   [] Wounds Psychological:  [] History of anxiety   []  History of major depression.  Physical Examination  Vitals:   03/25/18 1315 03/25/18 1400 03/25/18 1555 03/25/18 1600  BP:   (!) 142/80   Pulse: 98 89 79   Resp: 18 12 12    Temp:   (!) 97.5 F (36.4 C)   TempSrc:   Oral   SpO2: 100% 98% 98%   Weight:    143 lb 15.4 oz (65.3 kg)  Height:    5\' 3"  (1.6 m)   Body mass index is 25.5 kg/m. Gen:  Dementia, NAD Head: [place]/AT, No temporalis wasting. Prominent temp pulse not noted. Ear/Nose/Throat: Hearing grossly intact, nares w/o erythema or drainage, oropharynx w/o Erythema/Exudate Eyes: Sclera non-icteric, conjunctiva clear Neck: Trachea midline.  No JVD.  Pulmonary:  Good air movement, respirations not labored, equal bilaterally.  Cardiac: Irregular Vascular:  Vessel Right Left  Radial Palpable Palpable  Ulnar Palpable Palpable  Brachial Palpable Palpable  Carotid  Palpable, without bruit Palpable, without bruit  Aorta Not palpable N/A  Femoral Palpable Palpable  Popliteal Palpable Palpable  PT Palpable Palpable  DP Palpable Palpable   Right lower extremity: Thigh soft, calf soft.  Mild nonpitting edema.  Leg seems to be tender to palpation.  No acute vascular compromise noted. Left lower extremity: Thigh soft, calf soft.  Mild nonpitting edema.  Leg seems to be tender to palpation.  No acute vascular compromise noted.  Gastrointestinal: soft, non-tender/non-distended. No guarding/reflex.  Musculoskeletal: M/S 5/5 throughout.  Extremities without ischemic changes.  No deformity or atrophy.  Neurologic: Sensation grossly intact in extremities.  Symmetrical. Motor exam as listed above. Psychiatric: With dementia Dermatologic: No rashes or ulcers noted.  No cellulitis or open wounds. Lymph : No Cervical, Axillary, or Inguinal lymphadenopathy.  CBC Lab Results  Component Value Date   WBC 11.3 (H) 03/25/2018   HGB 11.2 (L) 03/25/2018   HCT 34.1 (L) 03/25/2018   MCV 84.0 03/25/2018   PLT 232 03/25/2018   BMET    Component Value Date/Time   NA 141 03/25/2018 1021   K 4.5 03/25/2018 1021   CL 105 03/25/2018 1021   CO2 28 03/25/2018 1021   GLUCOSE 103 (H) 03/25/2018 1021   BUN 17 03/25/2018 1021   CREATININE 1.24 (H) 03/25/2018 1021   CALCIUM 8.5 (L) 03/25/2018 1021   GFRNONAA 38 (L) 03/25/2018 1021   GFRAA 45 (L) 03/25/2018 1021   Estimated Creatinine Clearance: 30.2 mL/min (A) (by C-G formula based on SCr of 1.24 mg/dL (H)).  COAG Lab Results  Component Value Date   INR 1.09 02/11/2018   INR 1.42 07/26/2016   Radiology Ct Head  Wo Contrast  Result Date: 03/25/2018 CLINICAL DATA:  84 year old female with bilateral leg pain for few days. History of dementia. Subsequent encounter. EXAM: CT HEAD WITHOUT CONTRAST TECHNIQUE: Contiguous axial images were obtained from the base of the skull through the vertex without intravenous contrast.  COMPARISON:  03/23/2018 and 01/13/2018 head CT. FINDINGS: Brain: No intracranial hemorrhage or CT evidence of large acute infarct. Remote left posterior frontal-parietal lobe and right frontal lobe infarct. Marked chronic microvascular changes. Marked global atrophy most notable involving the temporal lobes. No intracranial mass lesion noted on this unenhanced exam. Vascular: Vascular calcifications Skull: No acute abnormality.  Sclerotic skull lesions unchanged. Sinuses/Orbits: No acute orbital abnormality. Visualized paranasal sinuses are clear. Other: Mastoid air cells and middle ear cavities are clear. IMPRESSION: 1. No intracranial hemorrhage or CT evidence of large acute infarct. 2. Remote infarcts and prominent chronic microvascular changes without change. 3. Marked global atrophy most notable involving the temporal lobes. Electronically Signed   By: Genia Del M.D.   On: 03/25/2018 12:29   Ct Head Wo Contrast  Result Date: 03/23/2018 CLINICAL DATA:  History of dementia.  Seizure. EXAM: CT HEAD WITHOUT CONTRAST TECHNIQUE: Contiguous axial images were obtained from the base of the skull through the vertex without intravenous contrast. COMPARISON:  CT scan March 06, 2018 FINDINGS: Brain: No subdural, epidural, or subarachnoid hemorrhage. Cerebellum, brainstem, and basal cisterns are normal. Ventricles and sulci are prominent but stable. White matter changes are moderate to severe and stable. No acute cortical ischemia or infarct identified. No mass effect or midline shift. Vascular: No hyperdense vessel or unexpected calcification. Skull: Normal. Negative for fracture or focal lesion. Sinuses/Orbits: No acute finding. Other: None. IMPRESSION: Volume loss with prominent sulci and ventricles, stable. Stable white matter changes. No acute intracranial abnormality. Electronically Signed   By: Dorise Bullion III M.D   On: 03/23/2018 17:00   Ct Head Wo Contrast  Result Date: 03/06/2018 CLINICAL DATA:   Seizure with questionable fall EXAM: CT HEAD WITHOUT CONTRAST TECHNIQUE: Contiguous axial images were obtained from the base of the skull through the vertex without intravenous contrast. COMPARISON:  February 24, 2018 and February 12, 2018 FINDINGS: Brain: There is extensive diffuse atrophy, stable. The previous right parietal subarachnoid hemorrhage is no longer evident. No hemorrhage is currently seen. There is no demonstrable mass. There is no midline shift or extra-axial fluid collection. There is evidence of a prior infarct in the right frontal lobe. There is extensive small vessel disease throughout the centra semiovale bilaterally. There is no new gray-white compartment lesion. No evident acute infarct. Vascular: No hyperdense vessels are evident. There is calcification in each carotid siphon region. Skull: The bony calvarium appears intact. There are small exostoses arising from the midline frontal bone region, stable. Sinuses/Orbits: There is mucosal thickening in several ethmoid air cells. Other visualized paranasal sinuses are clear. Visualized orbits appear symmetric bilaterally. Other: Mastoid air cells are clear. IMPRESSION: Extensive atrophy with extensive supratentorial small vessel disease. Prior infarct mid right frontal lobe, stable. No acute infarct evident. No mass or hemorrhage. There are foci of arterial vascular calcification. There is mucosal thickening in several ethmoid air cells. Electronically Signed   By: Lowella Grip III M.D.   On: 03/06/2018 09:03   Ct Head Wo Contrast  Result Date: 02/24/2018 CLINICAL DATA:  Fall in early June.  Follow-up. EXAM: CT HEAD WITHOUT CONTRAST TECHNIQUE: Contiguous axial images were obtained from the base of the skull through the vertex without intravenous contrast. COMPARISON:  02/12/2018 FINDINGS: Brain: Diffuse cerebral atrophy and extensive chronic small vessel disease throughout the deep white matter. Previously seen right parietal subarachnoid  hemorrhage no longer visualized. No residual or acute hemorrhage. No hydrocephalus, mass effect or midline shift. Vascular: No hyperdense vessel or unexpected calcification. Skull: No acute calvarial abnormality. Sinuses/Orbits: Visualized paranasal sinuses and mastoids clear. Orbital soft tissues unremarkable. Other: None IMPRESSION: Interval resolution of the previously seen right parietal subarachnoid hemorrhage. No acute findings. Diffuse atrophy, extensive chronic small vessel disease, stable. Electronically Signed   By: Rolm Baptise M.D.   On: 02/24/2018 08:31   Ct Angio Chest Pe W And/or Wo Contrast  Result Date: 03/25/2018 CLINICAL DATA:  Extensive DVT with known shortness of breath and chest pain, initial encounter EXAM: CT ANGIOGRAPHY CHEST WITH CONTRAST TECHNIQUE: Multidetector CT imaging of the chest was performed using the standard protocol during bolus administration of intravenous contrast. Multiplanar CT image reconstructions and MIPs were obtained to evaluate the vascular anatomy. CONTRAST:  62mL ISOVUE-370 IOPAMIDOL (ISOVUE-370) INJECTION 76% COMPARISON:  Chest x-ray from earlier in the same day. FINDINGS: Cardiovascular: Thoracic aorta is incompletely opacified although no definitive aneurysmal dilatation is seen. Pulmonary artery is well visualized and demonstrates filling defects primarily within the right lower lobe arterial system but to a lesser degree in the left lower lobe consistent with pulmonary emboli. No findings to suggest right heart strain are seen. No coronary calcifications are noted. No cardiac enlargement is seen. Pacing device is noted. Mediastinum/Nodes: The esophagus is within normal limits. No hilar or mediastinal adenopathy is noted. Thoracic inlet is unremarkable. Lungs/Pleura: Lungs are well aerated bilaterally. Some scattered atelectatic changes are noted in the right upper lobe. Hyperdense material is noted in the lower lobes bilaterally likely related to previous  aspiration. Upper Abdomen: Visualized upper abdomen is within normal limits. Musculoskeletal: Degenerative changes of the thoracic spine are noted. Review of the MIP images confirms the above findings. IMPRESSION: Changes of pulmonary emboli primarily in the right lower lobe as described without evidence of right heart strain. Mild right upper lobe atelectatic changes. Critical Value/emergent results were called by telephone at the time of interpretation on 03/25/2018 at 3:25 pm to Dr. Merlyn Lot , who verbally acknowledged these results. Electronically Signed   By: Inez Catalina M.D.   On: 03/25/2018 15:27   US Venous Img Lower Bilateral  Result Date: 03/25/2018 CLINICAL DATA:  84 year old female with bilateral leg pain and decreased mobility for the past several days. EXAM: BILATERAL LOWER EXTREMITY VENOUS DOPPLER ULTRASOUND TECHNIQUE: Gray-scale sonography with graded compression, as well as color Doppler and duplex ultrasound were performed to evaluate the lower extremity deep venous systems from the level of the common femoral vein and including the common femoral, femoral, profunda femoral, popliteal and calf veins including the posterior tibial, peroneal and gastrocnemius veins when visible. The superficial great saphenous vein was also interrogated. Spectral Doppler was utilized to evaluate flow at rest and with distal augmentation maneuvers in the common femoral, femoral and popliteal veins. COMPARISON:  Prior right lower extremity DVT ultrasound evaluation 10/31/2017 FINDINGS: RIGHT LOWER EXTREMITY Common Femoral Vein: Inferior to the origin of the great saphenous vein, the common femoral vein becomes incompressible in the lumen filled with internal echoes. There is no evidence of color flow on color Doppler imaging consistent with occlusive thrombus. The great saphenous vein and saphenofemoral junction remain patent. Saphenofemoral Junction: No evidence of thrombus. Normal compressibility and flow  on color Doppler imaging. Profunda Femoral Vein: No evidence of thrombus.  Normal compressibility and flow on color Doppler imaging. Femoral Vein: Thrombus extends throughout the femoral vein in the thigh and remains occlusive. Popliteal Vein: Thrombus extends into the popliteal vein and remains occlusive. Calf Veins: Color flow visible in the posterior tibial and peroneal veins. Superficial Great Saphenous Vein: No evidence of thrombus. Normal compressibility. Venous Reflux:  None. Other Findings:  None. LEFT LOWER EXTREMITY Common Femoral Vein: No evidence of thrombus. Normal compressibility, respiratory phasicity and response to augmentation. Saphenofemoral Junction: No evidence of thrombus. Normal compressibility and flow on color Doppler imaging. Profunda Femoral Vein: No evidence of thrombus. Normal compressibility and flow on color Doppler imaging. Femoral Vein: The femoral vein is patent in the proximal and mid thigh. However, in the distal thigh the vessel becomes noncompressible in the lumen is filled with low-level internal echoes. There is no evidence of color flow on color Doppler evaluation consistent with occlusive thrombus. Popliteal Vein: Occlusive thrombus extends into the popliteal vein. Calf Veins: Not well evaluated. Color flow is visualized in the posterior tibial and peroneal venous regions. Superficial Great Saphenous Vein: No evidence of thrombus. Normal compressibility. Venous Reflux:  None. Other Findings:  None. IMPRESSION: 1. Positive for extensive occlusive acute DVT in the right lower extremity beginning in the common femoral vein inferior to the origin of the great saphenous vein and extending throughout the femoral vein in the thigh and into the popliteal vein. 2. Positive for occlusive acute DVT in the left lower extremity beginning in the femoral vein in the distal thigh and extending into the popliteal vein. Electronically Signed   By: Jacqulynn Cadet M.D.   On: 03/25/2018 13:16    Dg Chest Portable 1 View  Result Date: 03/25/2018 CLINICAL DATA:  Altered mental status. EXAM: PORTABLE CHEST 1 VIEW COMPARISON:  03/15/2018 FINDINGS: Low lung volumes without focal disease or pulmonary edema. Surgical clips in the right axilla. Stable appearance of the single lead cardiac pacemaker. Heart and mediastinum are stable. Patient is mildly rotated towards the right. IMPRESSION: Low lung volumes without acute findings. Electronically Signed   By: Markus Daft M.D.   On: 03/25/2018 13:25   Dg Chest Portable 1 View  Result Date: 03/23/2018 CLINICAL DATA:  Seizure.  Dementia. EXAM: PORTABLE CHEST 1 VIEW COMPARISON:  Jan 13, 2018 FINDINGS: The heart size borderline. Stable pacemaker. No pneumothorax. The lungs are clear. The mediastinum is unchanged. IMPRESSION: No acute interval change. Electronically Signed   By: Dorise Bullion III M.D   On: 03/23/2018 17:46   Assessment/Plan he patient is an 84 year old female with a past medical history of sick sinus syndrome, anemia, chronic A. fib, congestive heart failure, diabetes mellitus type 2, hyperlipidemia, hypertension, severe mitral insufficiency, stroke, and dementia - stable 1. Bilateral DVT / PE: Heparin with a transition to p.o. blood thinner is contraindicated due to a recent cranial bleed.  The patient's daughter would like "everything to be done" to prevent any further pulmonary embolus.  Recommend IVC filter placement.  Do not recommend venous lysis the patient is not a candidate for it - patient with dementia, nonambulatory and bedbound.  Procedure, risks and benefits explained to the daughter.  All questions answered.  The daughter wishes to proceed.  We will plan on IVC filter placement Wednesday, March 26, 2018 with Dr. Delana Meyer. 2.  Hyperlipidemia: Encouraged good control as its slows the progression of atherosclerotic disease 3.  Diabetes: Encouraged good control as its slows the progression of atherosclerotic disease 4.   Hypertension: Encouraged good control  as its slows the progression of atherosclerotic disease  Discussed with Dr. Francene Castle, PA-C  03/25/2018 7:21 PM  This note was created with Dragon medical transcription system.  Any error is purely unintentional.

## 2018-03-25 NOTE — ED Notes (Addendum)
Family at bedside. States sent by MD Cine, due to decrease in mobility since ED discharge xfew days

## 2018-03-25 NOTE — Progress Notes (Signed)
Advanced Care Plan.  Purpose of Encounter: CODE STATUS and palliative care Parties in Attendance: The patient, her daughter and me. Patient's Decisional Capacity: No. Medical Story: Shelley Cameron  is a 84 y.o. female with a known history of multiple medical problems including A. fib, anemia, arthritis, breast cancer, hypertension, diabetes, hyperlipidemia, dementia, seizure, stroke and systolic CHF.  Per her daughter the patient has complaints of bilateral leg pain for a few days.  She was seen in the ER due to episode of seizure recently, Keppra was increased.  For the past 2 days, the patient has not been able to walk and has declined in mental status.  Venous duplex showed bilateral leg extensive DVT.  Since the patient has recent intracranial bleeding, I cannot start anticoagulation treatment.  The patient has very poor prognosis.  I discussed the patient current condition, poor prognosis and CODE STATUS with the patient's daughter.  She said she want full code for now.  But she agreed to get a palliative care consult for possible palliative care.   Goals of Care Determinations: Palliative care or hospice care. Plan:  Code Status: Full code Time spent discussing advance care planning: 20 minutes.

## 2018-03-25 NOTE — H&P (Signed)
[facility] [place] NAME: Shelley Cameron    MR#:  [id number]  DATE OF BIRTH:  1930-03-16  DATE OF ADMISSION:  03/25/2018  PRIMARY CARE PHYSICIAN: Adin Hector, MD   REQUESTING/REFERRING PHYSICIAN: Dr. Quentin Cornwall.  CHIEF COMPLAINT:   Chief Complaint  Patient presents with  . Leg Pain   Bilateral leg pain for few days. HISTORY OF PRESENT ILLNESS:  Shelley Cameron  is a 84 y.o. female with a known history of multiple medical problems including A. fib, anemia, arthritis, breast cancer, hypertension, diabetes, hyperlipidemia, dementia, seizure, stroke and systolic CHF.  Per her daughter the patient has complaints of bilateral leg pain for a few days.  She was seen in the ER due to episode of seizure recently, Keppra was increased.  For the past 2 days, the patient has not been able to walk and has declined in mental status.  Venous duplex showed bilateral leg extensive DVT. PAST MEDICAL HISTORY:   Past Medical History:  Diagnosis Date  . A-fib ([place])   . Anemia   . Arthritis   . Cancer ([place]) 1998, 1999   breast cancer. Bilateral mastectomy.   . Diabetes mellitus without complication ([place])   . DJD (degenerative joint disease)   . Gout   . Heart murmur   . Hyperlipidemia   . Hypertension   . Lymphedema of right arm   . Moderate dementia   . Seizures ([place])    Tonic-Clonic seizure activity  . Severe mitral insufficiency   . Stroke [facility]) 2011  . Syncope   . Systolic CHF ([place])   . Urinary incontinence     PAST SURGICAL HISTORY:   Past Surgical History:  Procedure Laterality Date  . BREAST SURGERY Left 08/1997   Dr Smith/mastectomy  . CATARACT EXTRACTION  2000  . EYE SURGERY Bilateral    Cataract Extraction with IOL  . MASTECTOMY Right 02/19/1997   Dr Bary Castilla  . MASTECTOMY Left 1999   Dr. Rochel Brome, [facility]  . PACEMAKER INSERTION Left 08/01/2016   Procedure: INSERTION PACEMAKER;  Surgeon: Isaias Cowman, MD;  Location:  [facility] ORS;  Service: Cardiovascular;  Laterality: Left;    SOCIAL HISTORY:   Social History   Tobacco Use  . Smoking status: Never Smoker  . Smokeless tobacco: Never Used  Substance Use Topics  . Alcohol use: No    FAMILY HISTORY:   Family History  Problem Relation Age of Onset  . Hypertension Father     DRUG ALLERGIES:   Allergies  Allergen Reactions  . Codeine Nausea Only    REVIEW OF SYSTEMS:   Review of Systems  Unable to perform ROS: Dementia    MEDICATIONS AT HOME:   Prior to Admission medications   Medication Sig Start Date End Date Taking? Authorizing Provider  acetaminophen (TYLENOL) 500 MG tablet Take 1,000 mg by mouth every 6 (six) hours as needed for mild pain.   Yes [provider]  cetirizine (ZYRTEC) 10 MG tablet Take 10 mg by mouth daily as needed for allergies.    Yes [provider]  Cholecalciferol (VITAMIN D) 2000 units CAPS Take 2,000 Units by mouth daily.    Yes [provider]  colchicine 0.6 MG tablet Take 0.6 mg by mouth as needed (Gout).    Yes [provider]  donepezil (ARICEPT) 10 MG tablet Take 10 mg by mouth at bedtime.    Yes [provider]  lamoTRIgine (LAMICTAL) 25 MG tablet  Take 25 mg by mouth 2 (two) times daily.  01/11/18  Yes [provider]  levETIRAcetam (KEPPRA) 750 MG tablet Take 750 mg by mouth 2 (two) times daily. 03/12/18  Yes [provider]  loratadine (CLARITIN) 10 MG tablet Take 10 mg by mouth daily as needed for allergies.    Yes [provider]  lovastatin (MEVACOR) 40 MG tablet Take 40 mg by mouth at bedtime.   Yes [provider]  memantine (NAMENDA) 5 MG tablet Take 5 mg by mouth 2 (two) times daily. 12/24/17  Yes [provider]  metoprolol tartrate (LOPRESSOR) 25 MG tablet Take 12.5 mg by mouth 2 (two) times daily. 03/18/18  Yes [provider]  QUEtiapine (SEROQUEL) 50 MG tablet Take 50 mg by mouth at bedtime. 10/30/17   Yes [provider]  venlafaxine XR (EFFEXOR-XR) 37.5 MG 24 hr capsule Take 37.5 mg by mouth daily. 12/31/17  Yes [provider]  vitamin B-12 (CYANOCOBALAMIN) 1000 MCG tablet Take 1,000 mcg by mouth daily.   Yes [provider]  conjugated estrogens (PREMARIN) vaginal cream Apply 0.5mg  (pea-sized amount)  just inside the vaginal introitus with a finger-tip on  Monday, Wednesday and Friday nights. Patient not taking: Reported on 03/25/2018 02/28/18   Zara Council A, PA-C  estradiol (ESTRACE VAGINAL) 0.1 MG/GM vaginal cream Apply 0.5mg  (pea-sized amount)  just inside the vaginal introitus with a finger-tip on Monday, Wednesday and Friday nights. Patient not taking: Reported on 03/06/2018 02/28/18   Zara Council A, PA-C      VITAL SIGNS:  Blood pressure 140/69, pulse 70, temperature 98.8 F (37.1 C), temperature source Oral, resp. rate 16, weight 152 lb (68.9 kg), SpO2 100 %.  PHYSICAL EXAMINATION:  Physical Exam  GENERAL:  84 y.o.-year-old patient lying in the bed with no acute distress.  EYES: Pupils equal, round, reactive to light and accommodation. No scleral icterus. Extraocular muscles intact.  HEENT: Head atraumatic, normocephalic.  NECK:  Supple, no jugular venous distention. No thyroid enlargement, no tenderness.  LUNGS: Normal breath sounds bilaterally, no wheezing, rales,rhonchi or crepitation. No use of accessory muscles of respiration.  CARDIOVASCULAR: S1, S2 normal. No murmurs, rubs, or gallops.  ABDOMEN: Soft, nontender, nondistended. Bowel sounds present. No organomegaly or mass.  EXTREMITIES: No cyanosis, or clubbing.  Bilateral leg tenderness up to side.  Mild swelling. NEUROLOGIC: Unable to exam. PSYCHIATRIC: The patient is demented. SKIN: No obvious rash, lesion, or ulcer.   LABORATORY PANEL:   CBC Recent Labs  Lab 03/25/18 1021  WBC 11.3*  HGB 11.2*  HCT 34.1*  PLT 232    ------------------------------------------------------------------------------------------------------------------  Chemistries  Recent Labs  Lab 03/25/18 1021  NA 141  K 4.5  CL 105  CO2 28  GLUCOSE 103*  BUN 17  CREATININE 1.24*  CALCIUM 8.5*  AST 36  ALT 26  ALKPHOS 116  BILITOT 0.9   ------------------------------------------------------------------------------------------------------------------  Cardiac Enzymes Recent Labs  Lab 03/25/18 1021  TROPONINI <0.03   ------------------------------------------------------------------------------------------------------------------  RADIOLOGY:  Ct Head Wo Contrast  Result Date: 03/25/2018 CLINICAL DATA:  84 year old female with bilateral leg pain for few days. History of dementia. Subsequent encounter. EXAM: CT HEAD WITHOUT CONTRAST TECHNIQUE: Contiguous axial images were obtained from the base of the skull through the vertex without intravenous contrast. COMPARISON:  03/23/2018 and 01/13/2018 head CT. FINDINGS: Brain: No intracranial hemorrhage or CT evidence of large acute infarct. Remote left posterior frontal-parietal lobe and right frontal lobe infarct. Marked chronic microvascular changes. Marked global atrophy most notable  involving the temporal lobes. No intracranial mass lesion noted on this unenhanced exam. Vascular: Vascular calcifications Skull: No acute abnormality.  Sclerotic skull lesions unchanged. Sinuses/Orbits: No acute orbital abnormality. Visualized paranasal sinuses are clear. Other: Mastoid air cells and middle ear cavities are clear. IMPRESSION: 1. No intracranial hemorrhage or CT evidence of large acute infarct. 2. Remote infarcts and prominent chronic microvascular changes without change. 3. Marked global atrophy most notable involving the temporal lobes. Electronically Signed   By: Genia Del M.D.   On: 03/25/2018 12:29   Ct Head Wo Contrast  Result Date: 03/23/2018 CLINICAL DATA:  History of  dementia.  Seizure. EXAM: CT HEAD WITHOUT CONTRAST TECHNIQUE: Contiguous axial images were obtained from the base of the skull through the vertex without intravenous contrast. COMPARISON:  CT scan March 06, 2018 FINDINGS: Brain: No subdural, epidural, or subarachnoid hemorrhage. Cerebellum, brainstem, and basal cisterns are normal. Ventricles and sulci are prominent but stable. White matter changes are moderate to severe and stable. No acute cortical ischemia or infarct identified. No mass effect or midline shift. Vascular: No hyperdense vessel or unexpected calcification. Skull: Normal. Negative for fracture or focal lesion. Sinuses/Orbits: No acute finding. Other: None. IMPRESSION: Volume loss with prominent sulci and ventricles, stable. Stable white matter changes. No acute intracranial abnormality. Electronically Signed   By: Dorise Bullion III M.D   On: 03/23/2018 17:00   US Venous Img Lower Bilateral  Result Date: 03/25/2018 CLINICAL DATA:  84 year old female with bilateral leg pain and decreased mobility for the past several days. EXAM: BILATERAL LOWER EXTREMITY VENOUS DOPPLER ULTRASOUND TECHNIQUE: Gray-scale sonography with graded compression, as well as color Doppler and duplex ultrasound were performed to evaluate the lower extremity deep venous systems from the level of the common femoral vein and including the common femoral, femoral, profunda femoral, popliteal and calf veins including the posterior tibial, peroneal and gastrocnemius veins when visible. The superficial great saphenous vein was also interrogated. Spectral Doppler was utilized to evaluate flow at rest and with distal augmentation maneuvers in the common femoral, femoral and popliteal veins. COMPARISON:  Prior right lower extremity DVT ultrasound evaluation 10/31/2017 FINDINGS: RIGHT LOWER EXTREMITY Common Femoral Vein: Inferior to the origin of the great saphenous vein, the common femoral vein becomes incompressible in the lumen  filled with internal echoes. There is no evidence of color flow on color Doppler imaging consistent with occlusive thrombus. The great saphenous vein and saphenofemoral junction remain patent. Saphenofemoral Junction: No evidence of thrombus. Normal compressibility and flow on color Doppler imaging. Profunda Femoral Vein: No evidence of thrombus. Normal compressibility and flow on color Doppler imaging. Femoral Vein: Thrombus extends throughout the femoral vein in the thigh and remains occlusive. Popliteal Vein: Thrombus extends into the popliteal vein and remains occlusive. Calf Veins: Color flow visible in the posterior tibial and peroneal veins. Superficial Great Saphenous Vein: No evidence of thrombus. Normal compressibility. Venous Reflux:  None. Other Findings:  None. LEFT LOWER EXTREMITY Common Femoral Vein: No evidence of thrombus. Normal compressibility, respiratory phasicity and response to augmentation. Saphenofemoral Junction: No evidence of thrombus. Normal compressibility and flow on color Doppler imaging. Profunda Femoral Vein: No evidence of thrombus. Normal compressibility and flow on color Doppler imaging. Femoral Vein: The femoral vein is patent in the proximal and mid thigh. However, in the distal thigh the vessel becomes noncompressible in the lumen is filled with low-level internal echoes. There is no evidence of color flow on color Doppler evaluation consistent with occlusive thrombus. Popliteal Vein: Occlusive  thrombus extends into the popliteal vein. Calf Veins: Not well evaluated. Color flow is visualized in the posterior tibial and peroneal venous regions. Superficial Great Saphenous Vein: No evidence of thrombus. Normal compressibility. Venous Reflux:  None. Other Findings:  None. IMPRESSION: 1. Positive for extensive occlusive acute DVT in the right lower extremity beginning in the common femoral vein inferior to the origin of the great saphenous vein and extending throughout the femoral  vein in the thigh and into the popliteal vein. 2. Positive for occlusive acute DVT in the left lower extremity beginning in the femoral vein in the distal thigh and extending into the popliteal vein. Electronically Signed   By: Jacqulynn Cadet M.D.   On: 03/25/2018 13:16   Dg Chest Portable 1 View  Result Date: 03/25/2018 CLINICAL DATA:  Altered mental status. EXAM: PORTABLE CHEST 1 VIEW COMPARISON:  03/15/2018 FINDINGS: Low lung volumes without focal disease or pulmonary edema. Surgical clips in the right axilla. Stable appearance of the single lead cardiac pacemaker. Heart and mediastinum are stable. Patient is mildly rotated towards the right. IMPRESSION: Low lung volumes without acute findings. Electronically Signed   By: Markus Daft M.D.   On: 03/25/2018 13:25   Dg Chest Portable 1 View  Result Date: 03/23/2018 CLINICAL DATA:  Seizure.  Dementia. EXAM: PORTABLE CHEST 1 VIEW COMPARISON:  Jan 13, 2018 FINDINGS: The heart size borderline. Stable pacemaker. No pneumothorax. The lungs are clear. The mediastinum is unchanged. IMPRESSION: No acute interval change. Electronically Signed   By: Dorise Bullion III M.D   On: 03/23/2018 17:46      IMPRESSION AND PLAN:   Bilateral extensive DVT. The patient will be admitted to medical floor. I was going to start heparin drip, but the daughter mentioned that the patient recently had fall and intracranial bleeding.  She was on Xarelto for A. fib, which was discontinued.  She has high risk for bleeding if start heparin drip.  I will request hematology consult and vascular surgery consult for possible IVC filter.  Follow-up CT angiogram of the chest to rule out PE.  Chronic persistent A. fib.  Xarelto was discontinued due to recent intracranial bleeding.  Continue Lopressor.  Seizure disorder.  Continue Keppra.  Dementia.  Aspiration fall precaution.  Very poor prognosis, palliative care consult.  All the records are reviewed and case discussed with  ED provider. Management plans discussed with the patient, her daughter and they are in agreement.  CODE STATUS: Full code  TOTAL TIME TAKING CARE OF THIS PATIENT: 40 minutes.    Demetrios Loll M.D on 03/25/2018 at 2:18 PM  Between 7am to 6pm - Pager - [phone]  After 6pm go to [email or web address] - password [email or web address] [facility]  [employer] [place] [facility]  Office  [phone]  CC: Primary care physician; Adin Hector, MD   Note: This dictation was prepared with Dragon dictation along with smaller phrase technology. Any transcriptional errors that result from this process are unin

## 2018-03-25 NOTE — ED Notes (Signed)
ED Provider at bedside. 

## 2018-03-25 NOTE — ED Triage Notes (Addendum)
PT to ED via EMS from home with c/o bilat leg pain xfew days. Hx of dementia, pt at baseline per EMS. VSS. PT in NAD at this time

## 2018-03-25 NOTE — Clinical Social Work Note (Signed)
Patient is a new admit to the [facility] medical floor. CSW received consult for placement from ED physician, CSW has requested a PT consult from the attending and he has placed this order. CSW will follow up. Shela Leff MSW,LCSW [phone]

## 2018-03-25 NOTE — ED Notes (Signed)
CT called

## 2018-03-25 NOTE — ED Notes (Signed)
Patient transported to CT 

## 2018-03-25 NOTE — ED Notes (Signed)
Pt has returned from CT. NAD noted at this time. Family at bedside.

## 2018-03-25 NOTE — ED Provider Notes (Addendum)
[facility] Emergency Department Provider Note    First MD Initiated Contact with Patient 03/25/18 [phone]     (approximate)  I have reviewed the triage vital signs and the nursing notes.   HISTORY  Chief Complaint Leg Pain  Level V Caveat:  dementia  HPI PERLA Shelley Cameron is a 84 y.o. female extensive past medical history presents the ER with chief complaint of decreased mobility complaining of bilateral leg pain for the past few days.  Patient was recently seen in the ER due to episode of seizure activity.  Had her Keppra increased but she only took this increased dose one time.  Minor metabolic work-up was reassuring she is discharged home.  Over the past 2 days patient has had steady decline in her mental status and is not been able to walk which she previously was able to.  She is currently at home with her daughter who is the primary caregiver.  Unable to provide any additional reports.  Patient yells anytime you touch her or try to move her.    Past Medical History:  Diagnosis Date  . A-fib ([place])   . Anemia   . Arthritis   . Cancer ([place]) 1998, 1999   breast cancer. Bilateral mastectomy.   . Diabetes mellitus without complication ([place])   . DJD (degenerative joint disease)   . Gout   . Heart murmur   . Hyperlipidemia   . Hypertension   . Lymphedema of right arm   . Moderate dementia   . Seizures ([place])    Tonic-Clonic seizure activity  . Severe mitral insufficiency   . Stroke [facility]) 2011  . Syncope   . Systolic CHF ([place])   . Urinary incontinence    Family History  Problem Relation Age of Onset  . Hypertension Father    Past Surgical History:  Procedure Laterality Date  . BREAST SURGERY Left 08/1997   Dr Smith/mastectomy  . CATARACT EXTRACTION  2000  . EYE SURGERY Bilateral    Cataract Extraction with IOL  . MASTECTOMY Right 02/19/1997   Dr Bary Castilla  . MASTECTOMY Left 1999   Dr. Rochel Brome, [facility]  . PACEMAKER INSERTION Left 08/01/2016   Procedure: INSERTION PACEMAKER;  Surgeon: Isaias Cowman, MD;  Location: [facility] ORS;  Service: Cardiovascular;  Laterality: Left;   Patient Active Problem List   Diagnosis Date Noted  . DVT (deep vein thrombosis) in pregnancy ([place]) 03/25/2018  . Anemia, unspecified 02/23/2018  . Arthritis 02/23/2018  . Congestive heart failure with left ventricular systolic dysfunction ([place]) 02/23/2018  . Diabetes mellitus type 2, uncomplicated ([place]) 40/34/7425  . Gout 02/23/2018  . Cause of injury, fall 02/13/2018  . Cardiac syncope 11/26/2017  . Sick sinus syndrome ([place]) 08/01/2016  . Chest wall pain 05/30/2016  . B12 deficiency 04/25/2016  . Edema of right lower extremity 01/10/2016  . Chronic a-fib ([place]) 09/07/2014      Prior to Admission medications   Medication Sig Start Date End Date Taking? Authorizing Provider  acetaminophen (TYLENOL) 500 MG tablet Take 1,000 mg by mouth every 6 (six) hours as needed for mild pain.   Yes [provider]  cetirizine (ZYRTEC) 10 MG tablet Take 10 mg by mouth daily as needed for allergies.    Yes [provider]  Cholecalciferol (VITAMIN D) 2000 units CAPS Take 2,000 Units by mouth daily.    Yes [provider]  colchicine 0.6 MG tablet Take 0.6 mg by mouth as needed (Gout).  Yes [provider]  donepezil (ARICEPT) 10 MG tablet Take 10 mg by mouth at bedtime.    Yes [provider]  lamoTRIgine (LAMICTAL) 25 MG tablet Take 25 mg by mouth 2 (two) times daily.  01/11/18  Yes [provider]  levETIRAcetam (KEPPRA) 750 MG tablet Take 750 mg by mouth 2 (two) times daily. 03/12/18  Yes [provider]  loratadine (CLARITIN) 10 MG tablet Take 10 mg by mouth daily as needed for allergies.    Yes [provider]  lovastatin (MEVACOR) 40 MG tablet Take 40 mg by mouth at bedtime.   Yes [provider]  memantine (NAMENDA) 5 MG tablet Take 5 mg by mouth 2 (two) times daily. 12/24/17  Yes  [provider]  metoprolol tartrate (LOPRESSOR) 25 MG tablet Take 12.5 mg by mouth 2 (two) times daily. 03/18/18  Yes [provider]  QUEtiapine (SEROQUEL) 50 MG tablet Take 50 mg by mouth at bedtime. 10/30/17  Yes [provider]  venlafaxine XR (EFFEXOR-XR) 37.5 MG 24 hr capsule Take 37.5 mg by mouth daily. 12/31/17  Yes [provider]  vitamin B-12 (CYANOCOBALAMIN) 1000 MCG tablet Take 1,000 mcg by mouth daily.   Yes [provider]  conjugated estrogens (PREMARIN) vaginal cream Apply 0.5mg  (pea-sized amount)  just inside the vaginal introitus with a finger-tip on  Monday, Wednesday and Friday nights. Patient not taking: Reported on 03/25/2018 02/28/18   Zara Council A, PA-C  estradiol (ESTRACE VAGINAL) 0.1 MG/GM vaginal cream Apply 0.5mg  (pea-sized amount)  just inside the vaginal introitus with a finger-tip on Monday, Wednesday and Friday nights. Patient not taking: Reported on 03/06/2018 02/28/18   Zara Council A, PA-C    Allergies Codeine    Social History Social History   Tobacco Use  . Smoking status: Never Smoker  . Smokeless tobacco: Never Used  Substance Use Topics  . Alcohol use: No  . Drug use: No    Review of Systems Patient denies headaches, rhinorrhea, blurry vision, numbness, shortness of breath, chest pain, edema, cough, abdominal pain, nausea, vomiting, diarrhea, dysuria, fevers, rashes or hallucinations unless otherwise stated above in HPI. ____________________________________________   PHYSICAL EXAM:  VITAL SIGNS: Vitals:   03/25/18 1315 03/25/18 1400  BP:    Pulse: 98 89  Resp: 18 12  Temp:    SpO2: 100% 98%    Constitutional: chronically ill appearing Eyes: Conjunctivae are normal.  Head: Atraumatic. Nose: No congestion/rhinnorhea. Mouth/Throat: Mucous membranes are moist.   Neck: No stridor. Painless ROM.  Cardiovascular: Normal rate, regular rhythm. Grossly normal heart sounds.  Good peripheral  circulation. Respiratory: Normal respiratory effort.  No retractions. Lungs CTAB. Gastrointestinal: Soft and nontender. No distention. No abdominal bruits. No CVA tenderness. Genitourinary: deferred Musculoskeletal: 1+ BLE edema, yells with palpation of BLE. No joint effusions. Neurologic: No gross focal neurologic deficits are appreciated. No facial droop Skin:  Skin is warm, dry and intact. No rash noted. ____________________________________________   LABS (all labs ordered are listed, but only abnormal results are displayed)  Results for orders placed or performed during the hospital encounter of 03/25/18 (from the past 24 hour(s))  CK     Status: None   Collection Time: 03/25/18 10:21 AM  Result Value Ref Range   Total CK 61 38 - 234 U/L  CBC     Status: Abnormal   Collection Time: 03/25/18 10:21 AM  Result Value Ref Range   WBC 11.3 (H) 3.6 - 11.0 K/uL   RBC 4.05  3.80 - 5.20 MIL/uL   Hemoglobin 11.2 (L) 12.0 - 16.0 g/dL   HCT 34.1 (L) 35.0 - 47.0 %   MCV 84.0 80.0 - 100.0 fL   MCH 27.7 26.0 - 34.0 pg   MCHC 33.0 32.0 - 36.0 g/dL   RDW 14.1 11.5 - 14.5 %   Platelets 232 150 - 440 K/uL  Comprehensive metabolic panel     Status: Abnormal   Collection Time: 03/25/18 10:21 AM  Result Value Ref Range   Sodium 141 135 - 145 mmol/L   Potassium 4.5 3.5 - 5.1 mmol/L   Chloride 105 98 - 111 mmol/L   CO2 28 22 - 32 mmol/L   Glucose, Bld 103 (H) 70 - 99 mg/dL   BUN 17 8 - 23 mg/dL   Creatinine, Ser 1.24 (H) 0.44 - 1.00 mg/dL   Calcium 8.5 (L) 8.9 - 10.3 mg/dL   Total Protein 6.9 6.5 - 8.1 g/dL   Albumin 2.9 (L) 3.5 - 5.0 g/dL   AST 36 15 - 41 U/L   ALT 26 0 - 44 U/L   Alkaline Phosphatase 116 38 - 126 U/L   Total Bilirubin 0.9 0.3 - 1.2 mg/dL   GFR calc non Af Amer 38 (L) >60 mL/min   GFR calc Af Amer 45 (L) >60 mL/min   Anion gap 8 5 - 15  Troponin I     Status: None   Collection Time: 03/25/18 10:21 AM  Result Value Ref Range   Troponin I <0.03 <0.03 ng/mL  Urinalysis,  Complete w Microscopic     Status: Abnormal   Collection Time: 03/25/18 10:21 AM  Result Value Ref Range   Color, Urine YELLOW (A) YELLOW   APPearance CLEAR (A) CLEAR   Specific Gravity, Urine 1.011 1.005 - 1.030   pH 6.0 5.0 - 8.0   Glucose, UA NEGATIVE NEGATIVE mg/dL   Hgb urine dipstick NEGATIVE NEGATIVE   Bilirubin Urine NEGATIVE NEGATIVE   Ketones, ur NEGATIVE NEGATIVE mg/dL   Protein, ur 100 (A) NEGATIVE mg/dL   Nitrite NEGATIVE NEGATIVE   Leukocytes, UA NEGATIVE NEGATIVE   WBC, UA 0-5 0 - 5 WBC/hpf   Bacteria, UA NONE SEEN NONE SEEN   Squamous Epithelial / LPF 0-5 0 - 5   ____________________________________________  EKG My review and personal interpretation at Time: 11:25   Indication: ams  Rate: 85  Rhythm: afib Axis: normal  Other: normal intervals, no stemi ____________________________________________  RADIOLOGY  I personally reviewed all radiographic images ordered to evaluate for the above acute complaints and reviewed radiology reports and findings.  These findings were personally discussed with the patient.  Please see medical record for radiology report.  ____________________________________________   PROCEDURES  Procedure(s) performed:  .Critical Care Performed by: Merlyn Lot, MD Authorized by: Merlyn Lot, MD   Critical care provider statement:    Critical care time (minutes):  35   Critical care time was exclusive of:  Separately billable procedures and treating other patients   Critical care was necessary to treat or prevent imminent or life-threatening deterioration of the following conditions:  Respiratory failure and CNS failure or compromise   Critical care was time spent personally by me on the following activities:  Development of treatment plan with patient or surrogate, discussions with consultants, evaluation of patient's response to treatment, examination of patient, obtaining history from patient or surrogate, ordering and  performing treatments and interventions, ordering and review of laboratory studies, ordering and review of radiographic studies, pulse oximetry,  re-evaluation of patient's condition and review of old charts      Critical Care performed: yes ____________________________________________   INITIAL IMPRESSION / ASSESSMENT AND PLAN / ED COURSE  Pertinent labs & imaging results that were available during my care of the patient were reviewed by me and considered in my medical decision making (see chart for details).   DDX: Dehydration, sepsis, pna, uti, hypoglycemia, cva, drug effect, withdrawal, encephalitis   Shelley Cameron is a 84 y.o. who presents to the ED with symptoms as described above.  Extensive evaluation of underlying pathology started.  Blood work is fairly reassuring.  No evidence of UTI.  Given her lower extremity pain ultrasound ordered which does show extensive bilateral occlusive acute DVT.  CT head shows no evidence of CVA or bleed.  Encephalopathy may be secondary to metabolic process.  Discussed case with hospitalist Dr. Bridgett Larsson agree with need for CT Angio of the chest to exclude PE as well.  Does show evidence of pulmonary embolism. Initial plan for anticoagulation but patient with recent IP hemorrhage after fall on anticoagulation therefore contraindicated.  Patient will be admitted to the hospital for further hemodynamic monitoring and further discussion of goals of care.      As part of my medical decision making, I reviewed the following data within the [place] notes reviewed and incorporated, Labs reviewed, notes from prior ED visits and [place] Controlled Substance Database   ____________________________________________   FINAL CLINICAL IMPRESSION(S) / ED DIAGNOSES  Final diagnoses:  Acute encephalopathy  Other acute pulmonary embolism without acute cor pulmonale (HCC)  Acute deep vein thrombosis (DVT) of other specified vein of both lower  extremities ([place])      NEW MEDICATIONS STARTED DURING THIS VISIT:  Current Discharge Medication List       Note:  This document was prepared using Dragon voice recognition software and may include unintentional dictation errors.    Merlyn Lot, MD 03/25/18 1554    Merlyn Lot, MD 03/25/18 [place]    Merlyn Lot, MD 04/04/18 [phone]

## 2018-03-26 ENCOUNTER — Encounter
Admission: EM | Disposition: A | Discharge status: Skilled Nursing Facility | Payer: Self-pay | Source: Home / Self Care | Attending: Specialist

## 2018-03-26 DIAGNOSIS — E119 Type 2 diabetes mellitus without complications: Secondary | ICD-10-CM

## 2018-03-26 DIAGNOSIS — E785 Hyperlipidemia, unspecified: Secondary | ICD-10-CM

## 2018-03-26 DIAGNOSIS — F0390 Unspecified dementia without behavioral disturbance: Secondary | ICD-10-CM

## 2018-03-26 DIAGNOSIS — R079 Chest pain, unspecified: Secondary | ICD-10-CM

## 2018-03-26 DIAGNOSIS — Z7189 Other specified counseling: Secondary | ICD-10-CM

## 2018-03-26 DIAGNOSIS — I110 Hypertensive heart disease with heart failure: Secondary | ICD-10-CM

## 2018-03-26 DIAGNOSIS — Z515 Encounter for palliative care: Secondary | ICD-10-CM

## 2018-03-26 DIAGNOSIS — I82493 Acute embolism and thrombosis of other specified deep vein of lower extremity, bilateral: Secondary | ICD-10-CM

## 2018-03-26 DIAGNOSIS — I495 Sick sinus syndrome: Secondary | ICD-10-CM

## 2018-03-26 DIAGNOSIS — I482 Chronic atrial fibrillation: Secondary | ICD-10-CM

## 2018-03-26 DIAGNOSIS — I2699 Other pulmonary embolism without acute cor pulmonale: Secondary | ICD-10-CM

## 2018-03-26 DIAGNOSIS — I82409 Acute embolism and thrombosis of unspecified deep veins of unspecified lower extremity: Secondary | ICD-10-CM

## 2018-03-26 DIAGNOSIS — I82403 Acute embolism and thrombosis of unspecified deep veins of lower extremity, bilateral: Principal | ICD-10-CM

## 2018-03-26 DIAGNOSIS — I509 Heart failure, unspecified: Secondary | ICD-10-CM

## 2018-03-26 DIAGNOSIS — I619 Nontraumatic intracerebral hemorrhage, unspecified: Secondary | ICD-10-CM

## 2018-03-26 HISTORY — PX: IVC FILTER INSERTION: CATH118245

## 2018-03-26 LAB — CBC
HCT: 32.1 % — ABNORMAL LOW (ref 35.0–47.0)
Hemoglobin: 10.6 g/dL — ABNORMAL LOW (ref 12.0–16.0)
MCH: 27.6 pg (ref 26.0–34.0)
MCHC: 33.1 g/dL (ref 32.0–36.0)
MCV: 83.5 fL (ref 80.0–100.0)
PLATELETS: 249 10*3/uL (ref 150–440)
RBC: 3.85 MIL/uL (ref 3.80–5.20)
RDW: 14.2 % (ref 11.5–14.5)
WBC: 11.6 10*3/uL — AB (ref 3.6–11.0)

## 2018-03-26 LAB — TROPONIN I: Troponin I: <0.03 ng/mL (ref <0.03)

## 2018-03-26 LAB — BASIC METABOLIC PANEL
Anion gap: 7 (ref 5–15)
BUN: 16 mg/dL (ref 8–23)
CALCIUM: 8.2 mg/dL — AB (ref 8.9–10.3)
CO2: 29 mmol/L (ref 22–32)
CREATININE: 1.07 mg/dL — AB (ref 0.44–1.00)
Chloride: 106 mmol/L (ref 98–111)
GFR calc Af Amer: 53 mL/min — ABNORMAL LOW (ref >60)
GFR calc non Af Amer: 46 mL/min — ABNORMAL LOW (ref >60)
GLUCOSE: 98 mg/dL (ref 70–99)
POTASSIUM: 4.3 mmol/L (ref 3.5–5.1)
SODIUM: 142 mmol/L (ref 135–145)

## 2018-03-26 SURGERY — IVC FILTER INSERTION
Anesthesia: Moderate Sedation

## 2018-03-26 MED ORDER — MORPHINE SULFATE (PF) 2 MG/ML IV SOLN
1.0000 mg | Freq: Once | INTRAVENOUS | Status: AC
  Administered 2018-03-26: 1 mg via INTRAVENOUS
  Filled 2018-03-26: qty 1

## 2018-03-26 MED ORDER — LIDOCAINE HCL (PF) 1 % IJ SOLN
INTRAMUSCULAR | Status: AC
  Filled 2018-03-26: qty 30

## 2018-03-26 MED ORDER — NITROGLYCERIN 0.4 MG SL SUBL: 0.4000 mg | SUBLINGUAL_TABLET | SUBLINGUAL | Status: DC | PRN

## 2018-03-26 MED ORDER — MIDAZOLAM HCL 2 MG/2ML IJ SOLN
INTRAMUSCULAR | Status: AC
  Filled 2018-03-26: qty 2

## 2018-03-26 MED ORDER — FENTANYL CITRATE (PF) 100 MCG/2ML IJ SOLN
INTRAMUSCULAR | Status: DC | PRN
  Administered 2018-03-26 (×2): 25 ug via INTRAVENOUS

## 2018-03-26 MED ORDER — IOPAMIDOL (ISOVUE-300) INJECTION 61%
INTRAVENOUS | Status: DC | PRN
  Administered 2018-03-26: 15 mL via INTRA_ARTERIAL

## 2018-03-26 MED ORDER — FAMOTIDINE IN NACL 20-0.9 MG/50ML-% IV SOLN
20.0000 mg | INTRAVENOUS | Status: DC
  Administered 2018-03-26: 20 mg via INTRAVENOUS
  Filled 2018-03-26: qty 50

## 2018-03-26 MED ORDER — GI COCKTAIL ~~LOC~~
30.0000 mL | Freq: Once | ORAL | Status: AC
  Administered 2018-03-26: 30 mL via ORAL
  Filled 2018-03-26: qty 30

## 2018-03-26 MED ORDER — FENTANYL CITRATE (PF) 100 MCG/2ML IJ SOLN
INTRAMUSCULAR | Status: AC
  Filled 2018-03-26: qty 2

## 2018-03-26 MED ORDER — MORPHINE SULFATE (PF) 2 MG/ML IV SOLN: 2.0000 mg | INTRAVENOUS | Status: DC | PRN

## 2018-03-26 MED ORDER — MIDAZOLAM HCL 2 MG/2ML IJ SOLN
INTRAMUSCULAR | Status: DC | PRN
  Administered 2018-03-26 (×2): 0.5 mg via INTRAVENOUS

## 2018-03-26 MED ORDER — ALUM & MAG HYDROXIDE-SIMETH 200-200-20 MG/5ML PO SUSP: 30.0000 mL | Freq: Four times a day (QID) | ORAL | Status: DC | PRN

## 2018-03-26 MED ORDER — HEPARIN (PORCINE) IN NACL 1000-0.9 UT/500ML-% IV SOLN
INTRAVENOUS | Status: AC
  Filled 2018-03-26: qty 500

## 2018-03-26 SURGICAL SUPPLY — 5 items
KIT FEMORAL DEL DENALI (Miscellaneous) ×3 IMPLANT
NEEDLE ENTRY 21GA 7CM ECHOTIP (NEEDLE) ×3 IMPLANT
PACK ANGIOGRAPHY (CUSTOM PROCEDURE TRAY) ×3 IMPLANT
SET INTRO CAPELLA COAXIAL (SET/KITS/TRAYS/PACK) ×3 IMPLANT
WIRE J 3MM .035X145CM (WIRE) ×3 IMPLANT

## 2018-03-26 NOTE — Care Management (Signed)
Patient admitted from home with bilateral DVT along with pulmonary embolism.  Patient lives at home with daughter.  PCP Caryl Comes.  Patient for IVC filter today.  PT consult pending.  Palliative following.  Patient is open with [facility] home [facility].  [name] with [facility] notified of admission.

## 2018-03-26 NOTE — Progress Notes (Signed)
PT Cancellation Note  Patient Details Name: Shelley Cameron MRN: [id number] DOB: [birth year]   Cancelled Treatment:    Reason Eval/Treat Not Completed: Patient not medically ready;Other (comment)(Patient presents with b/l LE DVT and PE. Per chart review plan may be to place ICV filter. PT will follow up when patient is medically appropriate.)  Lieutenant Diego PT, DPT 10:42 AM,03/26/18  [phone]

## 2018-03-26 NOTE — Consult Note (Signed)
Consultation Note Date: 03/26/2018   Patient Name: Shelley Cameron  DOB: 1929/11/09  MRN: [id number]  Age / Sex: 84 y.o., female  PCP: Adin Hector, MD Referring Physician: Henreitta Leber, MD  Reason for Consultation: Establishing goals of care  HPI/Patient Profile:  Shelley Cameron  is a 84 y.o. female with a known history of multiple medical problems including A. fib, anemia, arthritis, breast cancer, hypertension, diabetes, hyperlipidemia, dementia, seizure, stroke and systolic CHF.  Per her daughter the patient has complaints of bilateral leg pain for a few days.    Clinical Assessment and Goals of Care: Patient is resting in bed. She is not verbal. Her daughter Shelley Cameron is at bedside.  She states that she has been living with her mother for the past 2 years.  She states that her mother has been declining over the past 1 to 2 months, but it has been accelerated over the past week.  She states prior to this time her mother was ambulatory with a walker with great difficulty.  She states that she now stays either in her recliner or in the bed.  Shelley Cameron sleeps in the chair all day, and her daughter wakes her to get her to eat.   Shelley Cameron states that she now bathes her mother and dresses her.  She states that going to doctor's appointments is very stressful and time-consuming.  She has to work on getting her ready for her doctor's appointment early in the morning, when the appointment is in the afternoon because it takes so long.  Her mother is now incontinent of bowel and bladder.  When she sits in the recliner she does not want to get out of it to move.  She eats if food is placed in front of her, and is able to feed herself, however she does not ask for food. She states her speech is now mostly mumbling.  Shelley Cameron gets angry and curses at her daughter.  She frequently tells her to get away from her work to  not touch her.  She does not recognize her home as hers and often will not get out of the car because it is not her house.  This has been a heavy load for Shelley Cameron as she is the only child.  She states that it is always been just her and her mother, as her mother was never married.  Shelley Cameron states that she is married and has children and grandchildren of her own, however it causes agitation when the family comes to visit her at Ms. Zartman's home as she does not recognize them, or even her a lot of times.  Her husband lives in their home, and comes to visit.  Shelley Cameron feels like she does not have a break from her mother's care.  She feels that physically and emotionally this is more than she can continue to bear.  She would like placement for her mother.  We discussed her diagnoses, prognosis, GOC, EOL wishes disposition and options.  A detailed  discussion was had today regarding advanced directives.  Concepts specific to code status, artifical feeding and hydration, continued IV antibiotics and rehospitalization were discussed.  The difference between an aggressive medical intervention path and a hospice comfort care path was discussed.  Values and goals of care important to patient and family were attempted to be elicited.  She recognizes that her mother's dementia cannot be corrected, and her mother will continue to decline.  She states that she would not want chest compressions shocks or breathing tube if her mother's heart and lungs were to stop.  She would not want her mother placed on a breathing tube.  She would never want to have a feeding tube.  We discussed hospice level care, but she states that she will give this some thought and we can re-meet tomorrow.   MOST from completed for DNR, limited additional interventions, abx okay, IV fluids okay, no feeding tube.   SUMMARY OF RECOMMENDATIONS    Patient receiving an IVC filter today. DNR/DNI. We will discuss hospice further tomorrow.  Code  Status/Advance Care Planning:  DNR    Symptom Management:   No signs of distress.   Palliative Prophylaxis:   Eye Care and Oral Care   Prognosis:   Poor.  A-fib, falls, removed from Eliquis 2/2 SAH. Extensive DVT bilaterally.  Dementia. Seizures.   Discharge Planning: To Be Determined      Primary Diagnoses: Present on Admission: . DVT (deep vein thrombosis) in pregnancy [facility])   I have reviewed the medical record, interviewed the patient and family, and examined the patient. The following aspects are pertinent.  Past Medical History:  Diagnosis Date  . A-fib ([place])   . Anemia   . Arthritis   . Cancer ([place]) 1998, 1999   breast cancer. Bilateral mastectomy.   . Diabetes mellitus without complication ([place])   . DJD (degenerative joint disease)   . Gout   . Heart murmur   . Hyperlipidemia   . Hypertension   . Lymphedema of right arm   . Moderate dementia   . Seizures ([place])    Tonic-Clonic seizure activity  . Severe mitral insufficiency   . Stroke [facility]) 2011  . Syncope   . Systolic CHF ([place])   . Urinary incontinence    Social History   Socioeconomic History  . Marital status: Single    Spouse name: Not on file  . Number of children: Not on file  . Years of education: Not on file  . Highest education level: Not on file  Occupational History  . Not on file  Social Needs  . Financial resource strain: Not on file  . Food insecurity:    Worry: Not on file    Inability: Not on file  . Transportation needs:    Medical: Not on file    Non-medical: Not on file  Tobacco Use  . Smoking status: Never Smoker  . Smokeless tobacco: Never Used  Substance and Sexual Activity  . Alcohol use: No  . Drug use: No  . Sexual activity: Never  Lifestyle  . Physical activity:    Days per week: Not on file    Minutes per session: Not on file  . Stress: Not on file  Relationships  . Social connections:    Talks on phone: Not on file    Gets together: Not on file     Attends religious service: Not on file    Active member of club or organization: Not on file  Attends meetings of clubs or organizations: Not on file    Relationship status: Not on file  Other Topics Concern  . Not on file  Social History Narrative  . Not on file   Family History  Problem Relation Age of Onset  . Hypertension Father    Scheduled Meds: . donepezil  10 mg Oral QHS  . lamoTRIgine  25 mg Oral BID  . levETIRAcetam  750 mg Oral BID  . loratadine  10 mg Oral Daily  . memantine  5 mg Oral BID  . metoprolol tartrate  12.5 mg Oral BID  . pneumococcal 23 valent vaccine  0.5 mL Intramuscular Tomorrow-1000  . polyethylene glycol  17 g Oral Daily  . pravastatin  40 mg Oral q1800  . QUEtiapine  50 mg Oral QHS  . venlafaxine XR  37.5 mg Oral Daily  . vitamin B-12  1,000 mcg Oral Daily   Continuous Infusions: . sodium chloride 50 mL/hr at 03/25/18 1703  .  ceFAZolin (ANCEF) IV     PRN Meds:.acetaminophen **OR** acetaminophen, albuterol, bisacodyl, colchicine, ondansetron **OR** ondansetron (ZOFRAN) IV, senna-docusate Medications Prior to Admission:  Prior to Admission medications   Medication Sig Start Date End Date Taking? Authorizing Provider  acetaminophen (TYLENOL) 500 MG tablet Take 1,000 mg by mouth every 6 (six) hours as needed for mild pain.   Yes [provider]  cetirizine (ZYRTEC) 10 MG tablet Take 10 mg by mouth daily as needed for allergies.    Yes [provider]  Cholecalciferol (VITAMIN D) 2000 units CAPS Take 2,000 Units by mouth daily.    Yes [provider]  colchicine 0.6 MG tablet Take 0.6 mg by mouth as needed (Gout).    Yes [provider]  donepezil (ARICEPT) 10 MG tablet Take 10 mg by mouth at bedtime.    Yes [provider]  lamoTRIgine (LAMICTAL) 25 MG tablet Take 25 mg by mouth 2 (two) times daily.  01/11/18  Yes [provider]  levETIRAcetam (KEPPRA) 750 MG tablet Take 750 mg by mouth 2 (two)  times daily. 03/12/18  Yes [provider]  loratadine (CLARITIN) 10 MG tablet Take 10 mg by mouth daily as needed for allergies.    Yes [provider]  lovastatin (MEVACOR) 40 MG tablet Take 40 mg by mouth at bedtime.   Yes [provider]  memantine (NAMENDA) 5 MG tablet Take 5 mg by mouth 2 (two) times daily. 12/24/17  Yes [provider]  metoprolol tartrate (LOPRESSOR) 25 MG tablet Take 12.5 mg by mouth 2 (two) times daily. 03/18/18  Yes [provider]  QUEtiapine (SEROQUEL) 50 MG tablet Take 50 mg by mouth at bedtime. 10/30/17  Yes [provider]  venlafaxine XR (EFFEXOR-XR) 37.5 MG 24 hr capsule Take 37.5 mg by mouth daily. 12/31/17  Yes [provider]  vitamin B-12 (CYANOCOBALAMIN) 1000 MCG tablet Take 1,000 mcg by mouth daily.   Yes [provider]  conjugated estrogens (PREMARIN) vaginal cream Apply 0.5mg  (pea-sized amount)  just inside the vaginal introitus with a finger-tip on  Monday, Wednesday and Friday nights. Patient not taking: Reported on 03/25/2018 02/28/18   Zara Council A, PA-C  estradiol (ESTRACE VAGINAL) 0.1 MG/GM vaginal cream Apply 0.5mg  (pea-sized amount)  just inside the vaginal introitus with a finger-tip on Monday, Wednesday and Friday nights. Patient not taking: Reported on 03/06/2018 02/28/18   Zara Council A, PA-C   Allergies  Allergen Reactions  . Codeine Nausea Only  Review of Systems  Unable to perform ROS Psychiatric/Behavioral: Confusion:      Physical Exam  Constitutional: No distress.  Pulmonary/Chest: Effort normal.  Neurological:  Resting with eyes closed.     Vital Signs: BP 135/72 (BP Location: Left Arm)   Pulse 97   Temp 98.3 F (36.8 C) (Oral)   Resp 19   Ht 5\' 3"  (1.6 m)   Wt 65.3 kg (143 lb 15.4 oz)   SpO2 98%   BMI 25.50 kg/m  Pain Scale: PAINAD POSS *See Group Information*: 1-Acceptable,Awake and alert Pain Score: 2    SpO2: SpO2: 98 % O2 Device:SpO2:  98 % O2 Flow Rate: .   IO: Intake/output summary:   Intake/Output Summary (Last 24 hours) at 03/26/2018 1441 Last data filed at 03/26/2018 1200 Gross per 24 hour  Intake 612 ml  Output 600 ml  Net 12 ml    LBM: Last BM Date: 03/23/18 Baseline Weight: Weight: 68.9 kg (152 lb) Most recent weight: Weight: 65.3 kg (143 lb 15.4 oz)     Palliative Assessment/Data: 30%     Time In: 12:45 Time Out: 2:45 Time Total: 120 min Greater than 50%  of this time was spent counseling and coordinating care related to the above assessment and plan.  Signed by: Asencion Gowda, NP   Please contact Palliative Medicine Team phone at [phone] for questions and concerns.  For individual provider: See Shea Evans

## 2018-03-26 NOTE — Progress Notes (Signed)
   [place] at [facility] Day: 1 day Shelley Cameron is a 84 y.o. female with past medical history of advanced dementia, chronic atrial fibrillation, traumatic fall with intracranial bleed, diabetes, hypertension presenting with Leg Pain .   Patient had extensive bilateral DVT with pulmonary embolism.  I had an extensive discussion with the patient's daughter about patient's poor prognosis given her advanced dementia now with bilateral DVT and pulmonary embolism and the chance that she cannot be anticoagulated.  Patient's daughter was open to consider hospice services but wanted to talk to palliative care.   Advance care planning discussed with patient  with additional Family at bedside. All questions in regards to overall condition and expected prognosis answered. The decision was made to continue current code status  CODE STATUS: full Time spent: 16 minutes

## 2018-03-26 NOTE — Op Note (Signed)
[place] [facility]   OPERATIVE NOTE    PRE-OPERATIVE DIAGNOSIS: DVT with PE  POST-OPERATIVE DIAGNOSIS: Same  PROCEDURE: 1.   Ultrasound guidance for vascular access to the right common femoral vein 2.   Catheter placement into the inferior vena cava 3.   Inferior venacavogram 4.   Placement of a Denali IVC filter  SURGEON: Hortencia Pilar  ASSISTANT(S): None  ANESTHESIA: Conscious sedation was administered by the interventional radiology RN under my direct supervision. IV Versed plus fentanyl were utilized. Continuous ECG, pulse oximetry and blood pressure was monitored throughout the entire procedure. Conscious sedation was for a total of 20 minutes.  ESTIMATED BLOOD LOSS: minimal  FINDING(S): 1.  Patent IVC  SPECIMEN(S):  none  INDICATIONS:   Shelley Cameron is a 84 y.o. y.o. female who presents with DVT in the lower extremity associated with pulmonary emboli.  She has a history of intracranial bleed and therefore cannot be anticoagulated.  Inferior vena cava filter is indicated for this reason.  Risks and benefits including filter thrombosis, migration, fracture, bleeding, and infection were all discussed.  We discussed that all IVC filters that we place can be removed if desired from the patient once the need for the filter has passed.    DESCRIPTION: After obtaining full informed written consent, the patient was brought back to the vascular suite. The skin was sterilely prepped and draped in a sterile surgical field was created. Ultrasound was placed in a sterile sleeve. The right common femoral vein was echolucent and compressible indicating patency. Image was recorded for the permanent record. The puncture was made under continuous real-time ultrasound guidance.  The right common femoral vein was accessed under direct ultrasound guidance without difficulty with a micropuncture needle. Microwire was then advanced under fluoroscopic guidance without difficulty.  Micro-sheath was then inserted and a J-wire was then placed. The dilator is passed over the wire and the delivery sheath was placed into the inferior vena cava.  Inferior venacavogram was performed. This demonstrated a patent IVC with the level of the renal veins at L2.  The filter was then deployed into the inferior vena cava at the level of L3 just below the renal veins. The delivery sheath was then removed. Pressure was held. Sterile dressings were placed. The patient tolerated the procedure well and was taken to the recovery room in stable condition.  Interpretation: Inferior vena cava is widely patent.  It measures 16 mm in diameter.  Blushes from the renal veins are noted above.  Denali filter placed in good orientation.  COMPLICATIONS: None  CONDITION: Stable  Hortencia Pilar  03/26/2018, 5:39 PM

## 2018-03-26 NOTE — Progress Notes (Signed)
Patient complained of chest pain to upper right chest, chest pain subsided quickly. Dr. Jerelyn Charles notified and ordered nitroglycerin, EKG, GI cocktail, Protonix and stopped IV meds. Nurse notified patient and daughter to notify nurse if patient complains of chest pain again. Night shift nurse aware of chest pain episode.

## 2018-03-26 NOTE — Consult Note (Signed)
Hematology/Oncology Consult note [facility] Telephone:([phone] [phone]  Patient Care Team: Adin Hector, MD as PCP - General (Internal Medicine) Adin Hector, MD as Referring Physician (Internal Medicine) Bary Castilla Forest Gleason, MD (General Surgery)   Name of the patient: Shelley Cameron  [id number]  09/03/1929    Reason for consult: b/l LE DVT and PE   Requesting physician: Dr. Bridgett Larsson   History of presenting illness-patient is a 84 year old female with multiple comorbidities including hypertension, hyperlipidemia, CHF, chronic A. fib, sick sinus syndrome, type 2 diabetes and dementia among other comorbidities.  At baseline patient lives with her daughter.  Overall patient has been declining over the last 1 to 2 months.  Prior to that she was able to ambulate around the house with the help of walker but off late her mobility has declined considerably.  She recently had a fall on 02/11/2018 and was admitted to [facility] and was found to have an acute intracranial bleed.  At that time her Xarelto was stopped for her A. fib.  Since that hospital discharge patient has been barely able to get out of bed.  She mentioned to her daughter that her legs were painful and was therefore brought to the ER.  Venous Doppler on 03/25/2018 showed extensive occlusive acute DVT in the right lower extremity from the common femoral vein inferior to the origin of the great saphenous vein and extending throughout the femoral vein in the thigh into the popliteal vein.  Acute DVT was also noted in the left lower extremity from the femoral vein in the distal thigh extending into the popliteal vein.  CTA of the chest on 03/25/2018 showed changes of pulmonary emboli in the right lower lobe without evidence of right heart strain.  Hematology and vascular surgery has been consulted for further management.  I met with the patient's daughter at the bedside who has not discussed with her  mother regarding her goals of care.  At this point she would like the patient to be a full code.  Patient was unable to give any history.  She is laying comfortably in her bed and complains of leg pain   ECOG PS- 4  Pain scale- unable to assess   Review of systems- Review of Systems  Unable to perform ROS: Acuity of condition     Allergies  Allergen Reactions  . Codeine Nausea Only    Patient Active Problem List   Diagnosis Date Noted  . DVT (deep vein thrombosis) in pregnancy ([place]) 03/25/2018  . Anemia, unspecified 02/23/2018  . Arthritis 02/23/2018  . Congestive heart failure with left ventricular systolic dysfunction ([place]) 02/23/2018  . Diabetes mellitus type 2, uncomplicated ([place]) 17/71/1657  . Gout 02/23/2018  . Cause of injury, fall 02/13/2018  . Cardiac syncope 11/26/2017  . Sick sinus syndrome ([employer]) 08/01/2016  . Chest wall pain 05/30/2016  . B12 deficiency 04/25/2016  . Edema of right lower extremity 01/10/2016  . Chronic a-fib ([place]) 09/07/2014     Past Medical History:  Diagnosis Date  . A-fib ([place])   . Anemia   . Arthritis   . Cancer ([place]) 1998, 1999   breast cancer. Bilateral mastectomy.   . Diabetes mellitus without complication ([place])   . DJD (degenerative joint disease)   . Gout   . Heart murmur   . Hyperlipidemia   . Hypertension   . Lymphedema of right arm   . Moderate dementia   . Seizures ([place])  Tonic-Clonic seizure activity  . Severe mitral insufficiency   . Stroke [facility]) 2011  . Syncope   . Systolic CHF ([place])   . Urinary incontinence      Past Surgical History:  Procedure Laterality Date  . BREAST SURGERY Left 08/1997   Dr Smith/mastectomy  . CATARACT EXTRACTION  2000  . EYE SURGERY Bilateral    Cataract Extraction with IOL  . MASTECTOMY Right 02/19/1997   Dr Bary Castilla  . MASTECTOMY Left 1999   Dr. Rochel Brome, [facility]  . PACEMAKER INSERTION Left 08/01/2016   Procedure: INSERTION PACEMAKER;  Surgeon: Isaias Cowman, MD;   Location: [facility] ORS;  Service: Cardiovascular;  Laterality: Left;    Social History   Socioeconomic History  . Marital status: Single    Spouse name: Not on file  . Number of children: Not on file  . Years of education: Not on file  . Highest education level: Not on file  Occupational History  . Not on file  Social Needs  . Financial resource strain: Not on file  . Food insecurity:    Worry: Not on file    Inability: Not on file  . Transportation needs:    Medical: Not on file    Non-medical: Not on file  Tobacco Use  . Smoking status: Never Smoker  . Smokeless tobacco: Never Used  Substance and Sexual Activity  . Alcohol use: No  . Drug use: No  . Sexual activity: Never  Lifestyle  . Physical activity:    Days per week: Not on file    Minutes per session: Not on file  . Stress: Not on file  Relationships  . Social connections:    Talks on phone: Not on file    Gets together: Not on file    Attends religious service: Not on file    Active member of club or organization: Not on file    Attends meetings of clubs or organizations: Not on file    Relationship status: Not on file  . Intimate partner violence:    Fear of current or ex partner: Not on file    Emotionally abused: Not on file    Physically abused: Not on file    Forced sexual activity: Not on file  Other Topics Concern  . Not on file  Social History Narrative  . Not on file     Family History  Problem Relation Age of Onset  . Hypertension Father      Current Facility-Administered Medications:  .  0.9 %  sodium chloride infusion, , Intravenous, Continuous, Demetrios Loll, MD, Last Rate: 50 mL/hr at 03/25/18 1703 .  acetaminophen (TYLENOL) tablet 650 mg, 650 mg, Oral, Q6H PRN, 650 mg at 03/26/18 0601 **OR** acetaminophen (TYLENOL) suppository 650 mg, 650 mg, Rectal, Q6H PRN, Demetrios Loll, MD .  albuterol (PROVENTIL) (2.5 MG/3ML) 0.083% nebulizer solution 2.5 mg, 2.5 mg, Nebulization, Q2H PRN, Demetrios Loll,  MD .  bisacodyl (DULCOLAX) EC tablet 5 mg, 5 mg, Oral, Daily PRN, Demetrios Loll, MD .  ceFAZolin (ANCEF) IVPB 2g/100 mL premix, 2 g, Intravenous, On Call, Stegmayer, Kimberly A, PA-C .  colchicine tablet 0.6 mg, 0.6 mg, Oral, Daily PRN, Demetrios Loll, MD, 0.6 mg at 03/25/18 1815 .  donepezil (ARICEPT) tablet 10 mg, 10 mg, Oral, QHS, Demetrios Loll, MD, 10 mg at 03/25/18 2203 .  lamoTRIgine (LAMICTAL) tablet 25 mg, 25 mg, Oral, BID, Demetrios Loll, MD, 25 mg at 03/25/18 2202 .  levETIRAcetam (KEPPRA) tablet 750 mg, 750  mg, Oral, BID, Demetrios Loll, MD, 750 mg at 03/25/18 2202 .  loratadine (CLARITIN) tablet 10 mg, 10 mg, Oral, Daily, Demetrios Loll, MD, 10 mg at 03/25/18 1814 .  memantine [facility]) tablet 5 mg, 5 mg, Oral, BID, Demetrios Loll, MD, 5 mg at 03/25/18 2202 .  metoprolol tartrate (LOPRESSOR) tablet 12.5 mg, 12.5 mg, Oral, BID, Demetrios Loll, MD, 12.5 mg at 03/25/18 2203 .  ondansetron (ZOFRAN) tablet 4 mg, 4 mg, Oral, Q6H PRN **OR** ondansetron (ZOFRAN) injection 4 mg, 4 mg, Intravenous, Q6H PRN, Demetrios Loll, MD .  pneumococcal 23 valent vaccine (PNU-IMMUNE) injection 0.5 mL, 0.5 mL, Intramuscular, Tomorrow-1000, Demetrios Loll, MD .  polyethylene glycol (MIRALAX / GLYCOLAX) packet 17 g, 17 g, Oral, Daily, Demetrios Loll, MD, 17 g at 03/25/18 1814 .  pravastatin (PRAVACHOL) tablet 40 mg, 40 mg, Oral, q1800, Demetrios Loll, MD, 40 mg at 03/25/18 1814 .  QUEtiapine (SEROQUEL) tablet 50 mg, 50 mg, Oral, QHS, Demetrios Loll, MD, 50 mg at 03/25/18 2202 .  senna-docusate (Senokot-S) tablet 1 tablet, 1 tablet, Oral, QHS PRN, Demetrios Loll, MD .  venlafaxine XR [facility]) 24 hr capsule 37.5 mg, 37.5 mg, Oral, Daily, Demetrios Loll, MD, 37.5 mg at 03/25/18 1814 .  vitamin B-12 (CYANOCOBALAMIN) tablet 1,000 mcg, 1,000 mcg, Oral, Daily, Demetrios Loll, MD, 1,000 mcg at 03/25/18 1814   Physical exam:  Vitals:   03/25/18 1555 03/25/18 1600 03/25/18 1953 03/26/18 0432  BP: (!) 142/80  (!) 143/64 139/90  Pulse: 79  77 98  Resp: 12  18 (!) 22   Temp: (!) 97.5 F (36.4 C)  98.4 F (36.9 C) 98.6 F (37 C)  TempSrc: Oral  Oral Oral  SpO2: 98%  100% 97%  Weight:  143 lb 15.4 oz (65.3 kg)    Height:  5' 3"  (1.6 m)     Physical Exam  Constitutional: She appears well-developed and well-nourished.  HENT:  Head: Normocephalic and atraumatic.  Eyes: Pupils are equal, round, and reactive to light. EOM are normal.  Neck: Normal range of motion.  Cardiovascular: Normal rate, regular rhythm and normal heart sounds.  Pulmonary/Chest: Effort normal and breath sounds normal.  Abdominal: Soft. Bowel sounds are normal.  Musculoskeletal: She exhibits edema (Trace bilateral edema).  Mild tenderness to palpation over bilateral lower extremities.  No redness or swelling noted  Neurological:  She is resting comfortably in bed.  Does not verbalize much  Skin: Skin is warm and dry.       CMP Latest Ref Rng & Units 03/26/2018  Glucose 70 - 99 mg/dL 98  BUN 8 - 23 mg/dL 16  Creatinine 0.44 - 1.00 mg/dL 1.07(H)  Sodium 135 - 145 mmol/L 142  Potassium 3.5 - 5.1 mmol/L 4.3  Chloride 98 - 111 mmol/L 106  CO2 22 - 32 mmol/L 29  Calcium 8.9 - 10.3 mg/dL 8.2(L)  Total Protein 6.5 - 8.1 g/dL -  Total Bilirubin 0.3 - 1.2 mg/dL -  Alkaline Phos 38 - 126 U/L -  AST 15 - 41 U/L -  ALT 0 - 44 U/L -   CBC Latest Ref Rng & Units 03/26/2018  WBC 3.6 - 11.0 K/uL 11.6(H)  Hemoglobin 12.0 - 16.0 g/dL 10.6(L)  Hematocrit 35.0 - 47.0 % 32.1(L)  Platelets 150 - 440 K/uL 249    @IMAGES @  Ct Head Wo Contrast  Result Date: 03/25/2018 CLINICAL DATA:  84 year old female with bilateral leg pain for few days. History of dementia. Subsequent encounter. EXAM: CT HEAD WITHOUT CONTRAST  TECHNIQUE: Contiguous axial images were obtained from the base of the skull through the vertex without intravenous contrast. COMPARISON:  03/23/2018 and 01/13/2018 head CT. FINDINGS: Brain: No intracranial hemorrhage or CT evidence of large acute infarct. Remote left posterior  frontal-parietal lobe and right frontal lobe infarct. Marked chronic microvascular changes. Marked global atrophy most notable involving the temporal lobes. No intracranial mass lesion noted on this unenhanced exam. Vascular: Vascular calcifications Skull: No acute abnormality.  Sclerotic skull lesions unchanged. Sinuses/Orbits: No acute orbital abnormality. Visualized paranasal sinuses are clear. Other: Mastoid air cells and middle ear cavities are clear. IMPRESSION: 1. No intracranial hemorrhage or CT evidence of large acute infarct. 2. Remote infarcts and prominent chronic microvascular changes without change. 3. Marked global atrophy most notable involving the temporal lobes. Electronically Signed   By: Genia Del M.D.   On: 03/25/2018 12:29   Ct Head Wo Contrast  Result Date: 03/23/2018 CLINICAL DATA:  History of dementia.  Seizure. EXAM: CT HEAD WITHOUT CONTRAST TECHNIQUE: Contiguous axial images were obtained from the base of the skull through the vertex without intravenous contrast. COMPARISON:  CT scan March 06, 2018 FINDINGS: Brain: No subdural, epidural, or subarachnoid hemorrhage. Cerebellum, brainstem, and basal cisterns are normal. Ventricles and sulci are prominent but stable. White matter changes are moderate to severe and stable. No acute cortical ischemia or infarct identified. No mass effect or midline shift. Vascular: No hyperdense vessel or unexpected calcification. Skull: Normal. Negative for fracture or focal lesion. Sinuses/Orbits: No acute finding. Other: None. IMPRESSION: Volume loss with prominent sulci and ventricles, stable. Stable white matter changes. No acute intracranial abnormality. Electronically Signed   By: Dorise Bullion III M.D   On: 03/23/2018 17:00   Ct Head Wo Contrast  Result Date: 03/06/2018 CLINICAL DATA:  Seizure with questionable fall EXAM: CT HEAD WITHOUT CONTRAST TECHNIQUE: Contiguous axial images were obtained from the base of the skull through the vertex  without intravenous contrast. COMPARISON:  February 24, 2018 and February 12, 2018 FINDINGS: Brain: There is extensive diffuse atrophy, stable. The previous right parietal subarachnoid hemorrhage is no longer evident. No hemorrhage is currently seen. There is no demonstrable mass. There is no midline shift or extra-axial fluid collection. There is evidence of a prior infarct in the right frontal lobe. There is extensive small vessel disease throughout the centra semiovale bilaterally. There is no new gray-white compartment lesion. No evident acute infarct. Vascular: No hyperdense vessels are evident. There is calcification in each carotid siphon region. Skull: The bony calvarium appears intact. There are small exostoses arising from the midline frontal bone region, stable. Sinuses/Orbits: There is mucosal thickening in several ethmoid air cells. Other visualized paranasal sinuses are clear. Visualized orbits appear symmetric bilaterally. Other: Mastoid air cells are clear. IMPRESSION: Extensive atrophy with extensive supratentorial small vessel disease. Prior infarct mid right frontal lobe, stable. No acute infarct evident. No mass or hemorrhage. There are foci of arterial vascular calcification. There is mucosal thickening in several ethmoid air cells. Electronically Signed   By: Lowella Grip III M.D.   On: 03/06/2018 09:03   Ct Angio Chest Pe W And/or Wo Contrast  Result Date: 03/25/2018 CLINICAL DATA:  Extensive DVT with known shortness of breath and chest pain, initial encounter EXAM: CT ANGIOGRAPHY CHEST WITH CONTRAST TECHNIQUE: Multidetector CT imaging of the chest was performed using the standard protocol during bolus administration of intravenous contrast. Multiplanar CT image reconstructions and MIPs were obtained to evaluate the vascular anatomy. CONTRAST:  58m ISOVUE-370 IOPAMIDOL (  ISOVUE-370) INJECTION 76% COMPARISON:  Chest x-ray from earlier in the same day. FINDINGS: Cardiovascular: Thoracic aorta is  incompletely opacified although no definitive aneurysmal dilatation is seen. Pulmonary artery is well visualized and demonstrates filling defects primarily within the right lower lobe arterial system but to a lesser degree in the left lower lobe consistent with pulmonary emboli. No findings to suggest right heart strain are seen. No coronary calcifications are noted. No cardiac enlargement is seen. Pacing device is noted. Mediastinum/Nodes: The esophagus is within normal limits. No hilar or mediastinal adenopathy is noted. Thoracic inlet is unremarkable. Lungs/Pleura: Lungs are well aerated bilaterally. Some scattered atelectatic changes are noted in the right upper lobe. Hyperdense material is noted in the lower lobes bilaterally likely related to previous aspiration. Upper Abdomen: Visualized upper abdomen is within normal limits. Musculoskeletal: Degenerative changes of the thoracic spine are noted. Review of the MIP images confirms the above findings. IMPRESSION: Changes of pulmonary emboli primarily in the right lower lobe as described without evidence of right heart strain. Mild right upper lobe atelectatic changes. Critical Value/emergent results were called by telephone at the time of interpretation on 03/25/2018 at 3:25 pm to Dr. Merlyn Lot , who verbally acknowledged these results. Electronically Signed   By: Inez Catalina M.D.   On: 03/25/2018 15:27   US Venous Img Lower Bilateral  Result Date: 03/25/2018 CLINICAL DATA:  84 year old female with bilateral leg pain and decreased mobility for the past several days. EXAM: BILATERAL LOWER EXTREMITY VENOUS DOPPLER ULTRASOUND TECHNIQUE: Gray-scale sonography with graded compression, as well as color Doppler and duplex ultrasound were performed to evaluate the lower extremity deep venous systems from the level of the common femoral vein and including the common femoral, femoral, profunda femoral, popliteal and calf veins including the posterior tibial,  peroneal and gastrocnemius veins when visible. The superficial great saphenous vein was also interrogated. Spectral Doppler was utilized to evaluate flow at rest and with distal augmentation maneuvers in the common femoral, femoral and popliteal veins. COMPARISON:  Prior right lower extremity DVT ultrasound evaluation 10/31/2017 FINDINGS: RIGHT LOWER EXTREMITY Common Femoral Vein: Inferior to the origin of the great saphenous vein, the common femoral vein becomes incompressible in the lumen filled with internal echoes. There is no evidence of color flow on color Doppler imaging consistent with occlusive thrombus. The great saphenous vein and saphenofemoral junction remain patent. Saphenofemoral Junction: No evidence of thrombus. Normal compressibility and flow on color Doppler imaging. Profunda Femoral Vein: No evidence of thrombus. Normal compressibility and flow on color Doppler imaging. Femoral Vein: Thrombus extends throughout the femoral vein in the thigh and remains occlusive. Popliteal Vein: Thrombus extends into the popliteal vein and remains occlusive. Calf Veins: Color flow visible in the posterior tibial and peroneal veins. Superficial Great Saphenous Vein: No evidence of thrombus. Normal compressibility. Venous Reflux:  None. Other Findings:  None. LEFT LOWER EXTREMITY Common Femoral Vein: No evidence of thrombus. Normal compressibility, respiratory phasicity and response to augmentation. Saphenofemoral Junction: No evidence of thrombus. Normal compressibility and flow on color Doppler imaging. Profunda Femoral Vein: No evidence of thrombus. Normal compressibility and flow on color Doppler imaging. Femoral Vein: The femoral vein is patent in the proximal and mid thigh. However, in the distal thigh the vessel becomes noncompressible in the lumen is filled with low-level internal echoes. There is no evidence of color flow on color Doppler evaluation consistent with occlusive thrombus. Popliteal Vein:  Occlusive thrombus extends into the popliteal vein. Calf Veins: Not well evaluated. Color  flow is visualized in the posterior tibial and peroneal venous regions. Superficial Great Saphenous Vein: No evidence of thrombus. Normal compressibility. Venous Reflux:  None. Other Findings:  None. IMPRESSION: 1. Positive for extensive occlusive acute DVT in the right lower extremity beginning in the common femoral vein inferior to the origin of the great saphenous vein and extending throughout the femoral vein in the thigh and into the popliteal vein. 2. Positive for occlusive acute DVT in the left lower extremity beginning in the femoral vein in the distal thigh and extending into the popliteal vein. Electronically Signed   By: Jacqulynn Cadet M.D.   On: 03/25/2018 13:16   Dg Chest Portable 1 View  Result Date: 03/25/2018 CLINICAL DATA:  Altered mental status. EXAM: PORTABLE CHEST 1 VIEW COMPARISON:  03/15/2018 FINDINGS: Low lung volumes without focal disease or pulmonary edema. Surgical clips in the right axilla. Stable appearance of the single lead cardiac pacemaker. Heart and mediastinum are stable. Patient is mildly rotated towards the right. IMPRESSION: Low lung volumes without acute findings. Electronically Signed   By: Markus Daft M.D.   On: 03/25/2018 13:25   Dg Chest Portable 1 View  Result Date: 03/23/2018 CLINICAL DATA:  Seizure.  Dementia. EXAM: PORTABLE CHEST 1 VIEW COMPARISON:  Jan 13, 2018 FINDINGS: The heart size borderline. Stable pacemaker. No pneumothorax. The lungs are clear. The mediastinum is unchanged. IMPRESSION: No acute interval change. Electronically Signed   By: Dorise Bullion III M.D   On: 03/23/2018 17:46    Assessment and plan- Patient is a 84 y.o. female with multiple comorbidities as mentioned above admitted for extensive bilateral lower extremity DVT and right lower lobe PE without evidence of right heart strain in the setting of acute intracranial bleed in June 2019  Given  the recent intracranial bleed she is not a candidate for anticoagulation.  Placing her IVC filter would be reasonable to prevent further worsening of her PE from her existing DVT. IVC filter would not however prevent propagation of her existing PE. I have explained all this to patients daughter.   Overall patient seems to be declining in her performance status. She is unable to ambulate and her daughter voiced concerns about caring for her at home. It would be reasonable to involve palliative care at this time to discuss further goals of care  We will sign off. Please call with questions.   Thank you for this kind referral and the opportunity to participate in the care of this  Patient   Visit Diagnosis 1. Acute encephalopathy   2. Other acute pulmonary embolism without acute cor pulmonale ([place])   3. Acute deep vein thrombosis (DVT) of other specified vein of both lower extremities (HCC)     Dr. Randa Evens, MD, MPH [facility] at [facility] [phone] 03/26/2018

## 2018-03-26 NOTE — Plan of Care (Signed)
Patient has dementia, unable to receive blood thinner due to recent intracranial bleed.

## 2018-03-26 NOTE — Progress Notes (Addendum)
[place] at [place] NAME: Shelley Cameron    MR#:  [id number]  DATE OF BIRTH:  1929/09/22  SUBJECTIVE:   Patient presented to the hospital with extensive lower extremity pain and noted to have bilateral DVT along with pulmonary embolism.  Patient is somewhat lethargic after getting some IV morphine today.  REVIEW OF SYSTEMS:    Review of Systems  Constitutional: Negative for chills and fever.  HENT: Negative for congestion and tinnitus.   Eyes: Negative for blurred vision and double vision.  Respiratory: Negative for cough, shortness of breath and wheezing.   Cardiovascular: Positive for leg swelling. Negative for chest pain, orthopnea and PND.  Gastrointestinal: Negative for abdominal pain, diarrhea, nausea and vomiting.  Genitourinary: Negative for dysuria and hematuria.  Neurological: Negative for dizziness, sensory change and focal weakness.  All other systems reviewed and are negative.   Nutrition: NPO for IVC filter Tolerating Diet: No Tolerating PT: Await Eval.    DRUG ALLERGIES:   Allergies  Allergen Reactions  . Codeine Nausea Only    VITALS:  Blood pressure 135/72, pulse 97, temperature 98.3 F (36.8 C), temperature source Oral, resp. rate 19, height 5\' 3"  (1.6 m), weight 65.3 kg (143 lb 15.4 oz), SpO2 98 %.  PHYSICAL EXAMINATION:   Physical Exam  GENERAL:  84 y.o.-year-old patient lying in bed in no acute distress.  EYES: Pupils equal, round, reactive to light and accommodation. No scleral icterus. Extraocular muscles intact.  HEENT: Head atraumatic, normocephalic. Oropharynx and nasopharynx clear.  NECK:  Supple, no jugular venous distention. No thyroid enlargement, no tenderness.  LUNGS: Normal breath sounds bilaterally, no wheezing, rales, rhonchi. No use of accessory muscles of respiration.  CARDIOVASCULAR: S1, S2 normal. No murmurs, rubs, or gallops.  ABDOMEN: Soft, nontender, nondistended. Bowel sounds  present. No organomegaly or mass.  EXTREMITIES: No cyanosis, clubbing, +1 edema b/l.    NEUROLOGIC: Cranial nerves II through XII are intact. No focal Motor or sensory deficits b/l.  Globally weak.  PSYCHIATRIC: The patient is alert and oriented x 1.  SKIN: No obvious rash, lesion, or ulcer.    LABORATORY PANEL:   CBC Recent Labs  Lab 03/26/18 0407  WBC 11.6*  HGB 10.6*  HCT 32.1*  PLT 249   ------------------------------------------------------------------------------------------------------------------  Chemistries  Recent Labs  Lab 03/25/18 1021 03/26/18 0407  NA 141 142  K 4.5 4.3  CL 105 106  CO2 28 29  GLUCOSE 103* 98  BUN 17 16  CREATININE 1.24* 1.07*  CALCIUM 8.5* 8.2*  MG 2.1  --   AST 36  --   ALT 26  --   ALKPHOS 116  --   BILITOT 0.9  --    ------------------------------------------------------------------------------------------------------------------  Cardiac Enzymes Recent Labs  Lab 03/25/18 1021  TROPONINI <0.03   ------------------------------------------------------------------------------------------------------------------  RADIOLOGY:  Ct Head Wo Contrast  Result Date: 03/25/2018 CLINICAL DATA:  84 year old female with bilateral leg pain for few days. History of dementia. Subsequent encounter. EXAM: CT HEAD WITHOUT CONTRAST TECHNIQUE: Contiguous axial images were obtained from the base of the skull through the vertex without intravenous contrast. COMPARISON:  03/23/2018 and 01/13/2018 head CT. FINDINGS: Brain: No intracranial hemorrhage or CT evidence of large acute infarct. Remote left posterior frontal-parietal lobe and right frontal lobe infarct. Marked chronic microvascular changes. Marked global atrophy most notable involving the temporal lobes. No intracranial mass lesion noted on this unenhanced exam. Vascular: Vascular calcifications Skull: No acute abnormality.  Sclerotic skull lesions unchanged. Sinuses/Orbits:  No acute orbital  abnormality. Visualized paranasal sinuses are clear. Other: Mastoid air cells and middle ear cavities are clear. IMPRESSION: 1. No intracranial hemorrhage or CT evidence of large acute infarct. 2. Remote infarcts and prominent chronic microvascular changes without change. 3. Marked global atrophy most notable involving the temporal lobes. Electronically Signed   By: Genia Del M.D.   On: 03/25/2018 12:29   Ct Angio Chest Pe W And/or Wo Contrast  Result Date: 03/25/2018 CLINICAL DATA:  Extensive DVT with known shortness of breath and chest pain, initial encounter EXAM: CT ANGIOGRAPHY CHEST WITH CONTRAST TECHNIQUE: Multidetector CT imaging of the chest was performed using the standard protocol during bolus administration of intravenous contrast. Multiplanar CT image reconstructions and MIPs were obtained to evaluate the vascular anatomy. CONTRAST:  66mL ISOVUE-370 IOPAMIDOL (ISOVUE-370) INJECTION 76% COMPARISON:  Chest x-ray from earlier in the same day. FINDINGS: Cardiovascular: Thoracic aorta is incompletely opacified although no definitive aneurysmal dilatation is seen. Pulmonary artery is well visualized and demonstrates filling defects primarily within the right lower lobe arterial system but to a lesser degree in the left lower lobe consistent with pulmonary emboli. No findings to suggest right heart strain are seen. No coronary calcifications are noted. No cardiac enlargement is seen. Pacing device is noted. Mediastinum/Nodes: The esophagus is within normal limits. No hilar or mediastinal adenopathy is noted. Thoracic inlet is unremarkable. Lungs/Pleura: Lungs are well aerated bilaterally. Some scattered atelectatic changes are noted in the right upper lobe. Hyperdense material is noted in the lower lobes bilaterally likely related to previous aspiration. Upper Abdomen: Visualized upper abdomen is within normal limits. Musculoskeletal: Degenerative changes of the thoracic spine are noted. Review of the  MIP images confirms the above findings. IMPRESSION: Changes of pulmonary emboli primarily in the right lower lobe as described without evidence of right heart strain. Mild right upper lobe atelectatic changes. Critical Value/emergent results were called by telephone at the time of interpretation on 03/25/2018 at 3:25 pm to Dr. Merlyn Lot , who verbally acknowledged these results. Electronically Signed   By: Inez Catalina M.D.   On: 03/25/2018 15:27   US Venous Img Lower Bilateral  Result Date: 03/25/2018 CLINICAL DATA:  84 year old female with bilateral leg pain and decreased mobility for the past several days. EXAM: BILATERAL LOWER EXTREMITY VENOUS DOPPLER ULTRASOUND TECHNIQUE: Gray-scale sonography with graded compression, as well as color Doppler and duplex ultrasound were performed to evaluate the lower extremity deep venous systems from the level of the common femoral vein and including the common femoral, femoral, profunda femoral, popliteal and calf veins including the posterior tibial, peroneal and gastrocnemius veins when visible. The superficial great saphenous vein was also interrogated. Spectral Doppler was utilized to evaluate flow at rest and with distal augmentation maneuvers in the common femoral, femoral and popliteal veins. COMPARISON:  Prior right lower extremity DVT ultrasound evaluation 10/31/2017 FINDINGS: RIGHT LOWER EXTREMITY Common Femoral Vein: Inferior to the origin of the great saphenous vein, the common femoral vein becomes incompressible in the lumen filled with internal echoes. There is no evidence of color flow on color Doppler imaging consistent with occlusive thrombus. The great saphenous vein and saphenofemoral junction remain patent. Saphenofemoral Junction: No evidence of thrombus. Normal compressibility and flow on color Doppler imaging. Profunda Femoral Vein: No evidence of thrombus. Normal compressibility and flow on color Doppler imaging. Femoral Vein: Thrombus  extends throughout the femoral vein in the thigh and remains occlusive. Popliteal Vein: Thrombus extends into the popliteal vein and remains occlusive. Calf  Veins: Color flow visible in the posterior tibial and peroneal veins. Superficial Great Saphenous Vein: No evidence of thrombus. Normal compressibility. Venous Reflux:  None. Other Findings:  None. LEFT LOWER EXTREMITY Common Femoral Vein: No evidence of thrombus. Normal compressibility, respiratory phasicity and response to augmentation. Saphenofemoral Junction: No evidence of thrombus. Normal compressibility and flow on color Doppler imaging. Profunda Femoral Vein: No evidence of thrombus. Normal compressibility and flow on color Doppler imaging. Femoral Vein: The femoral vein is patent in the proximal and mid thigh. However, in the distal thigh the vessel becomes noncompressible in the lumen is filled with low-level internal echoes. There is no evidence of color flow on color Doppler evaluation consistent with occlusive thrombus. Popliteal Vein: Occlusive thrombus extends into the popliteal vein. Calf Veins: Not well evaluated. Color flow is visualized in the posterior tibial and peroneal venous regions. Superficial Great Saphenous Vein: No evidence of thrombus. Normal compressibility. Venous Reflux:  None. Other Findings:  None. IMPRESSION: 1. Positive for extensive occlusive acute DVT in the right lower extremity beginning in the common femoral vein inferior to the origin of the great saphenous vein and extending throughout the femoral vein in the thigh and into the popliteal vein. 2. Positive for occlusive acute DVT in the left lower extremity beginning in the femoral vein in the distal thigh and extending into the popliteal vein. Electronically Signed   By: Jacqulynn Cadet M.D.   On: 03/25/2018 13:16   Dg Chest Portable 1 View  Result Date: 03/25/2018 CLINICAL DATA:  Altered mental status. EXAM: PORTABLE CHEST 1 VIEW COMPARISON:  03/15/2018  FINDINGS: Low lung volumes without focal disease or pulmonary edema. Surgical clips in the right axilla. Stable appearance of the single lead cardiac pacemaker. Heart and mediastinum are stable. Patient is mildly rotated towards the right. IMPRESSION: Low lung volumes without acute findings. Electronically Signed   By: Markus Daft M.D.   On: 03/25/2018 13:25     ASSESSMENT AND PLAN:   84 year old history of hypertension, hyperlipidemia, seizures, previous CVA, syncope, systolic CHF, diabetes, chronic atrial fibrillation, recent fall with a traumatic intracranial bleed who presents to the hospital due to lower extremity pain and noted to have extensive bilateral DVT, along with pulmonary embolism.  1.  DVT/pulmonary embolism-this is a provoked VTE given patient's poor mobility and advanced dementia. - Patient is a poor candidate for anticoagulation given her recent history of a traumatic fall with intracranial bleed.  Plan for IVC filter placement today.  2.  Advanced dementia-prognosis poor, discussed extensively with the patient's daughter at bedside. -We will get palliative care consult to discuss goals of care. -Continue Aricept, Namenda, Seroquel, Effexor  3.  hx of seizures-continue Keppra.  4.  Essential hypertension-continue metoprolol.  5.  History of gout-continue colchicine.  6. Hyperlipidemia - cont. Pravachol.   Once IVC filter has been placed will get PT work with patient. Pt is stable to work with PT once the IVC filter has been placed.    All the records are reviewed and case discussed with Care Management/Social Worker. Management plans discussed with the patient, family and they are in agreement.  CODE STATUS: Full code  DVT Prophylaxis: Ted's & SCD's.   TOTAL TIME TAKING CARE OF THIS PATIENT: 30 minutes.   POSSIBLE D/C IN 2-3 DAYS, DEPENDING ON CLINICAL CONDITION.   Henreitta Leber M.D on 03/26/2018 at 1:31 PM  Between 7am to 6pm - Pager - [phone]  After  6pm go to [email or web address] - [place]  [facility]  Office  [phone]  CC: Primary care physician; Adin Hector, MD

## 2018-03-27 ENCOUNTER — Encounter: Payer: Self-pay | Admitting: Vascular Surgery

## 2018-03-27 LAB — GLUCOSE, CAPILLARY
GLUCOSE-CAPILLARY: 113 mg/dL — AB (ref 70–99)
GLUCOSE-CAPILLARY: 160 mg/dL — AB (ref 70–99)

## 2018-03-27 LAB — TROPONIN I: Troponin I: <0.03 ng/mL (ref <0.03)

## 2018-03-27 MED ORDER — INSULIN ASPART 100 UNIT/ML ~~LOC~~ SOLN: 0.0000 [IU] | Freq: Three times a day (TID) | SUBCUTANEOUS | Status: DC

## 2018-03-27 MED ORDER — INSULIN ASPART 100 UNIT/ML ~~LOC~~ SOLN: 0.0000 [IU] | Freq: Every day | SUBCUTANEOUS | Status: DC

## 2018-03-27 MED ORDER — FAMOTIDINE 20 MG PO TABS
20.0000 mg | ORAL_TABLET | Freq: Every day | ORAL | Status: DC
  Administered 2018-03-27 – 2018-03-28 (×2): 20 mg via ORAL
  Filled 2018-03-27 (×2): qty 1

## 2018-03-27 NOTE — Progress Notes (Signed)
[place] at [place] NAME: Shelley Cameron    MR#:  [id number]  DATE OF BIRTH:  Nov 07, 1929  SUBJECTIVE:   Patient denies any leg pain, daughter is at bedside.  Patient is status post IVC filter placement.  Seen by physical therapy and they recommend short-term rehab.  REVIEW OF SYSTEMS:    Review of Systems  Constitutional: Negative for chills and fever.  HENT: Negative for congestion and tinnitus.   Eyes: Negative for blurred vision and double vision.  Respiratory: Negative for cough, shortness of breath and wheezing.   Cardiovascular: Positive for leg swelling. Negative for chest pain, orthopnea and PND.  Gastrointestinal: Negative for abdominal pain, diarrhea, nausea and vomiting.  Genitourinary: Negative for dysuria and hematuria.  Neurological: Negative for dizziness, sensory change and focal weakness.  All other systems reviewed and are negative.   Nutrition: Heart Healthy Tolerating Diet: Yes Tolerating PT: Eval noted.   DRUG ALLERGIES:   Allergies  Allergen Reactions  . Codeine Nausea Only    VITALS:  Blood pressure 120/66, pulse 83, temperature 98.5 F (36.9 C), temperature source Oral, resp. rate 16, height 5\' 3"  (1.6 m), weight 64.9 kg (143 lb), SpO2 99 %.  PHYSICAL EXAMINATION:   Physical Exam  GENERAL:  84 y.o.-year-old patient lying in bed in no acute distress.  EYES: Pupils equal, round, reactive to light and accommodation. No scleral icterus. Extraocular muscles intact.  HEENT: Head atraumatic, normocephalic. Oropharynx and nasopharynx clear.  NECK:  Supple, no jugular venous distention. No thyroid enlargement, no tenderness.  LUNGS: Normal breath sounds bilaterally, no wheezing, rales, rhonchi. No use of accessory muscles of respiration.  CARDIOVASCULAR: S1, S2 normal. No murmurs, rubs, or gallops.  ABDOMEN: Soft, nontender, nondistended. Bowel sounds present. No organomegaly or mass.  EXTREMITIES: No  cyanosis, clubbing, +1 edema b/l.    NEUROLOGIC: Cranial nerves II through XII are intact. No focal Motor or sensory deficits b/l.  Globally weak.  PSYCHIATRIC: The patient is alert and oriented x 1.  SKIN: No obvious rash, lesion, or ulcer.    LABORATORY PANEL:   CBC Recent Labs  Lab 03/26/18 0407  WBC 11.6*  HGB 10.6*  HCT 32.1*  PLT 249   ------------------------------------------------------------------------------------------------------------------  Chemistries  Recent Labs  Lab 03/25/18 1021 03/26/18 0407  NA 141 142  K 4.5 4.3  CL 105 106  CO2 28 29  GLUCOSE 103* 98  BUN 17 16  CREATININE 1.24* 1.07*  CALCIUM 8.5* 8.2*  MG 2.1  --   AST 36  --   ALT 26  --   ALKPHOS 116  --   BILITOT 0.9  --    ------------------------------------------------------------------------------------------------------------------  Cardiac Enzymes Recent Labs  Lab 03/27/18 0115  TROPONINI <0.03   ------------------------------------------------------------------------------------------------------------------  RADIOLOGY:  Ct Angio Chest Pe W And/or Wo Contrast  Result Date: 03/25/2018 CLINICAL DATA:  Extensive DVT with known shortness of breath and chest pain, initial encounter EXAM: CT ANGIOGRAPHY CHEST WITH CONTRAST TECHNIQUE: Multidetector CT imaging of the chest was performed using the standard protocol during bolus administration of intravenous contrast. Multiplanar CT image reconstructions and MIPs were obtained to evaluate the vascular anatomy. CONTRAST:  29mL ISOVUE-370 IOPAMIDOL (ISOVUE-370) INJECTION 76% COMPARISON:  Chest x-ray from earlier in the same day. FINDINGS: Cardiovascular: Thoracic aorta is incompletely opacified although no definitive aneurysmal dilatation is seen. Pulmonary artery is well visualized and demonstrates filling defects primarily within the right lower lobe arterial system but to a lesser degree in the  left lower lobe consistent with pulmonary  emboli. No findings to suggest right heart strain are seen. No coronary calcifications are noted. No cardiac enlargement is seen. Pacing device is noted. Mediastinum/Nodes: The esophagus is within normal limits. No hilar or mediastinal adenopathy is noted. Thoracic inlet is unremarkable. Lungs/Pleura: Lungs are well aerated bilaterally. Some scattered atelectatic changes are noted in the right upper lobe. Hyperdense material is noted in the lower lobes bilaterally likely related to previous aspiration. Upper Abdomen: Visualized upper abdomen is within normal limits. Musculoskeletal: Degenerative changes of the thoracic spine are noted. Review of the MIP images confirms the above findings. IMPRESSION: Changes of pulmonary emboli primarily in the right lower lobe as described without evidence of right heart strain. Mild right upper lobe atelectatic changes. Critical Value/emergent results were called by telephone at the time of interpretation on 03/25/2018 at 3:25 pm to Dr. Merlyn Lot , who verbally acknowledged these results. Electronically Signed   By: Inez Catalina M.D.   On: 03/25/2018 15:27     ASSESSMENT AND PLAN:   84 year old history of hypertension, hyperlipidemia, seizures, previous CVA, syncope, systolic CHF, diabetes, chronic atrial fibrillation, recent fall with a traumatic intracranial bleed who presents to the hospital due to lower extremity pain and noted to have extensive bilateral DVT, along with pulmonary embolism.  1.  DVT/pulmonary embolism-this is a provoked VTE given patient's poor mobility and advanced dementia. - Patient is a poor candidate for anticoagulation given her recent history of a traumatic fall with intracranial bleed.   -Patient is status post IVC filter placement yesterday.  2.  Advanced dementia-prognosis poor, discussed extensively with the patient's daughter at bedside. -Palliative care consult obtained and patient is a DNR now.  Plan for discharge to skilled  nursing facility with palliative to follow. -Continue Aricept, Namenda, Seroquel, Effexor  3.  hx of seizures-continue Keppra.  4.  Essential hypertension-continue metoprolol.  5.  History of gout-continue colchicine.  6. Hyperlipidemia - cont. Pravachol.   Physical therapy evaluation noted and social work aware.  Plan for discharge to rehab when bed available with possible palliative care nursing to follow.   All the records are reviewed and case discussed with Care Management/Social Worker. Management plans discussed with the patient, family and they are in agreement.  CODE STATUS: Full code  DVT Prophylaxis: Ted's & SCD's.   TOTAL TIME TAKING CARE OF THIS PATIENT: 30 minutes.   POSSIBLE D/C IN 1-2 DAYS, DEPENDING ON CLINICAL CONDITION.   Henreitta Leber M.D on 03/27/2018 at 1:44 PM  Between 7am to 6pm - Pager - [phone]  After 6pm go to [email or web address] - [place]  [facility] [place] [facility]  Office  [phone]  CC: Primary care physician; Adin Hector, MD

## 2018-03-27 NOTE — NC FL2 (Signed)
[place] LEVEL OF CARE SCREENING TOOL     IDENTIFICATION  Patient Name: Shelley Cameron Birthdate: [birth year] Sex: female Admission Date (Current Location): 03/25/2018  [place] and [place] Number:  Engineering geologist and Address:  [facility], [address] [place], [place] [address]      Provider Number: [id number]  Attending Physician Name and Address:  Henreitta Leber, MD  Relative Name and Phone Number:       Current Level of Care: Hospital Recommended Level of Care: [place] Prior Approval Number:    Date Approved/Denied:   PASRR Number:    Discharge Plan: SNF    Current Diagnoses: Patient Active Problem List   Diagnosis Date Noted  . DVT (deep vein thrombosis) in pregnancy ([place]) 03/25/2018  . Anemia, unspecified 02/23/2018  . Arthritis 02/23/2018  . Congestive heart failure with left ventricular systolic dysfunction ([place]) 02/23/2018  . Diabetes mellitus type 2, uncomplicated ([place]) 52/84/1324  . Gout 02/23/2018  . Cause of injury, fall 02/13/2018  . Cardiac syncope 11/26/2017  . Sick sinus syndrome ([place]) 08/01/2016  . Chest wall pain 05/30/2016  . B12 deficiency 04/25/2016  . Edema of right lower extremity 01/10/2016  . Chronic a-fib ([place]) 09/07/2014    Orientation RESPIRATION BLADDER Height & Weight     Self, Place  Normal, O2(2 liters prn) Incontinent Weight: 143 lb (64.9 kg) Height:  5\' 3"  (160 cm)  BEHAVIORAL SYMPTOMS/MOOD NEUROLOGICAL BOWEL NUTRITION STATUS  (none) Convulsions/Seizures Incontinent Diet(heart healthy)  AMBULATORY STATUS COMMUNICATION OF NEEDS Skin   Extensive Assist Verbally Normal                       Personal Care Assistance Level of Assistance              Functional Limitations Info  (none reported)          SPECIAL CARE FACTORS FREQUENCY  PT (By licensed PT)                    Contractures Contractures Info: Not present    Additional  Factors Info  Code Status, Allergies Code Status Info: dnr Allergies Info: codeine           Current Medications (03/27/2018):  This is the current hospital active medication list Current Facility-Administered Medications  Medication Dose Route Frequency Provider Last Rate Last Dose  . acetaminophen (TYLENOL) tablet 650 mg  650 mg Oral Q6H PRN Schnier, Dolores Lory, MD   650 mg at 03/26/18 0601   Or  . acetaminophen (TYLENOL) suppository 650 mg  650 mg Rectal Q6H PRN Schnier, Dolores Lory, MD      . albuterol (PROVENTIL) (2.5 MG/3ML) 0.083% nebulizer solution 2.5 mg  2.5 mg Nebulization Q2H PRN Schnier, Dolores Lory, MD      . alum & mag hydroxide-simeth (MAALOX/MYLANTA) 200-200-20 MG/5ML suspension 30 mL  30 mL Oral Q6H PRN Henreitta Leber, MD      . bisacodyl (DULCOLAX) EC tablet 5 mg  5 mg Oral Daily PRN Schnier, Dolores Lory, MD   5 mg at 03/27/18 0831  . colchicine tablet 0.6 mg  0.6 mg Oral Daily PRN Delana Meyer Dolores Lory, MD   0.6 mg at 03/25/18 1815  . donepezil (ARICEPT) tablet 10 mg  10 mg Oral QHS Schnier, Dolores Lory, MD   10 mg at 03/26/18 2200  . famotidine (PEPCID) IVPB 20 mg premix  20 mg Intravenous Q24H Salary,  Avel Peace, MD   Stopped at 03/26/18 2104  . lamoTRIgine (LAMICTAL) tablet 25 mg  25 mg Oral BID Delana Meyer Dolores Lory, MD   25 mg at 03/27/18 0831  . levETIRAcetam (KEPPRA) tablet 750 mg  750 mg Oral BID Delana Meyer Dolores Lory, MD   750 mg at 03/27/18 8921  . loratadine (CLARITIN) tablet 10 mg  10 mg Oral Daily Schnier, Dolores Lory, MD   10 mg at 03/27/18 0831  . memantine (NAMENDA) tablet 5 mg  5 mg Oral BID Schnier, Dolores Lory, MD   5 mg at 03/27/18 1941  . metoprolol tartrate (LOPRESSOR) tablet 12.5 mg  12.5 mg Oral BID Schnier, Dolores Lory, MD   12.5 mg at 03/27/18 0831  . morphine 2 MG/ML injection 2 mg  2 mg Intravenous Q2H PRN Salary, Montell D, MD      . nitroGLYCERIN (NITROSTAT) SL tablet 0.4 mg  0.4 mg Sublingual Q5 min PRN Salary, Montell D, MD      . ondansetron (ZOFRAN) tablet 4 mg   4 mg Oral Q6H PRN Schnier, Dolores Lory, MD       Or  . ondansetron [facility]) injection 4 mg  4 mg Intravenous Q6H PRN Schnier, Dolores Lory, MD   4 mg at 03/26/18 0846  . pneumococcal 23 valent vaccine (PNU-IMMUNE) injection 0.5 mL  0.5 mL Intramuscular Tomorrow-1000 Schnier, Dolores Lory, MD      . polyethylene glycol (MIRALAX / GLYCOLAX) packet 17 g  17 g Oral Daily Schnier, Dolores Lory, MD   17 g at 03/27/18 7408  . pravastatin (PRAVACHOL) tablet 40 mg  40 mg Oral q1800 Schnier, Dolores Lory, MD   40 mg at 03/26/18 1928  . QUEtiapine (SEROQUEL) tablet 50 mg  50 mg Oral QHS Schnier, Dolores Lory, MD   50 mg at 03/26/18 2159  . senna-docusate (Senokot-S) tablet 1 tablet  1 tablet Oral QHS PRN Schnier, Dolores Lory, MD   1 tablet at 03/27/18 0831  . venlafaxine XR (EFFEXOR-XR) 24 hr capsule 37.5 mg  37.5 mg Oral Daily Schnier, Dolores Lory, MD   37.5 mg at 03/27/18 1448  . vitamin B-12 (CYANOCOBALAMIN) tablet 1,000 mcg  1,000 mcg Oral Daily Schnier, Dolores Lory, MD   1,000 mcg at 03/27/18 1856     Discharge Medications: Please see discharge summary for a list of discharge medications.  Relevant Imaging Results:  Relevant Lab Results:   Additional Information ss: [id number]  Shela Leff, LCSW

## 2018-03-27 NOTE — Clinical Social Work Note (Signed)
Clinical Social Work Assessment  Patient Details  Name: Shelley Cameron MRN: [id number] Date of Birth: 03-23-1930  Date of referral:  03/27/18               Reason for consult:  Discharge Planning                Permission sought to share information with:    Permission granted to share information::     Name::        Agency::     Relationship::     Contact Information:     Housing/Transportation Living arrangements for the past 2 months:  Single Family Home Source of Information:  Adult Children Patient Interpreter Needed:  None Criminal Activity/Legal Involvement Pertinent to Current Situation/Hospitalization:  No - Comment as needed Significant Relationships:  Adult Children Lives with:  Adult Children Do you feel safe going back to the place where you live?  Yes Need for family participation in patient care:  Yes (Comment)  Care giving concerns:  Patient is from home and is cared for by her daughter: Shelley Cameron: [phone].    Social Worker assessment / plan:  CSW is aware that PT has recommended short term rehab. CSW spoke with patient's daughter and patient's bedside. Patient did not participate in any conversation. Patient's daughter had been requesting rehab prior to PT assessing so CSW had initiated a bed search this morning. CSW extended the bed offers and patient's daughter chose [facility]. CSW notified [facility] to begin prior auth with [employer].  Employment status:  Retired Nurse, adult PT Recommendations:  [place] / Referral to community resources:     Patient/Family's Response to care:  Patient's daughter expressed appreciation for CSW visit.  Patient/Family's Understanding of and Emotional Response to Diagnosis, Current Treatment, and Prognosis:  Caring for patient has been very taxing on patient's daughter as she has not been able to live with her husband for a while due to feeling as though  she must take care of her mother.   Emotional Assessment Appearance:  Appears stated age Attitude/Demeanor/Rapport:  (not talkative during assessment) Affect (typically observed):  Calm Orientation:    Alcohol / Substance use:  Not Applicable Psych involvement (Current and /or in the community):  No (Comment)  Discharge Needs  Concerns to be addressed:  Care Coordination Readmission within the last 30 days:  No Current discharge risk:  None Barriers to Discharge:  No Barriers Identified   Shela Leff, LCSW 03/27/2018, 1:48 PM

## 2018-03-27 NOTE — Progress Notes (Addendum)
Daily Progress Note   Patient Name: Shelley Cameron       Date: 03/27/2018 DOB: 1930-02-16  Age: 84 y.o. MRN#: [id number] Attending Physician: Henreitta Leber, MD Primary Care Physician: Adin Hector, MD Admit Date: 03/25/2018  Reason for Consultation/Follow-up: Establishing goals of care  Subjective: Patient is resting in bed with eyes closed. She does not speak. Her daughter is present at bedside. IVC filter has been placed. She states she is hopeful her mother will be placed in a facility as she is fearful of her discharging home. She states she is unable to lift her mother to move her and unable to turn her to change her diaper. She is amenable to her mother discharging to attempt rehab with palliative to follow. If she fails rehab, she would like hospice care.    Length of Stay: 2  Current Medications: Scheduled Meds:  . donepezil  10 mg Oral QHS  . lamoTRIgine  25 mg Oral BID  . levETIRAcetam  750 mg Oral BID  . loratadine  10 mg Oral Daily  . memantine  5 mg Oral BID  . metoprolol tartrate  12.5 mg Oral BID  . pneumococcal 23 valent vaccine  0.5 mL Intramuscular Tomorrow-1000  . polyethylene glycol  17 g Oral Daily  . pravastatin  40 mg Oral q1800  . QUEtiapine  50 mg Oral QHS  . venlafaxine XR  37.5 mg Oral Daily  . vitamin B-12  1,000 mcg Oral Daily    Continuous Infusions: . famotidine (PEPCID) IV Stopped (03/26/18 2104)    PRN Meds: acetaminophen **OR** acetaminophen, albuterol, alum & mag hydroxide-simeth, bisacodyl, colchicine, morphine injection, nitroGLYCERIN, ondansetron **OR** ondansetron (ZOFRAN) IV, senna-docusate  Physical Exam  Constitutional: No distress.  Pulmonary/Chest: Effort normal.  Neurological:  Resting with eyes closed.             Vital  Signs: BP 120/66 (BP Location: Left Arm)   Pulse 83   Temp 98.5 F (36.9 C) (Oral)   Resp 16   Ht 5\' 3"  (1.6 m)   Wt 64.9 kg (143 lb)   SpO2 99%   BMI 25.33 kg/m  SpO2: SpO2: 99 % O2 Device: O2 Device: Room Air O2 Flow Rate: O2 Flow Rate (L/min): 2 L/min  Intake/output summary:   Intake/Output Summary (Last 24  hours) at 03/27/2018 1257 Last data filed at 03/27/2018 0858 Gross per 24 hour  Intake 50 ml  Output 350 ml  Net -300 ml   LBM: Last BM Date: 03/23/18 Baseline Weight: Weight: 68.9 kg (152 lb) Most recent weight: Weight: 64.9 kg (143 lb)       Palliative Assessment/Data: 30%      Patient Active Problem List   Diagnosis Date Noted  . DVT (deep vein thrombosis) in pregnancy ([place]) 03/25/2018  . Anemia, unspecified 02/23/2018  . Arthritis 02/23/2018  . Congestive heart failure with left ventricular systolic dysfunction ([place]) 02/23/2018  . Diabetes mellitus type 2, uncomplicated ([place]) 27/74/1287  . Gout 02/23/2018  . Cause of injury, fall 02/13/2018  . Cardiac syncope 11/26/2017  . Sick sinus syndrome ([place]) 08/01/2016  . Chest wall pain 05/30/2016  . B12 deficiency 04/25/2016  . Edema of right lower extremity 01/10/2016  . Chronic a-fib ([place]) 09/07/2014    Palliative Care Assessment & Plan    Assessment/Recommendations/Plan:  IVC filter placed  Patient likely discharging to rehab, recommend palliative to follow with transition to hospice when appropriate. If patient discharges home, recommend hospice.    Code Status:    Code Status Orders  (From admission, onward)        Start     Ordered   03/26/18 1440  Do not attempt resuscitation (DNR)  Continuous    Question Answer Comment  In the event of cardiac or respiratory ARREST Do not call a "code blue"   In the event of cardiac or respiratory ARREST Do not perform Intubation, CPR, defibrillation or ACLS   In the event of cardiac or respiratory ARREST Use medication by any route, position, wound  care, and other measures to relive pain and suffering. May use oxygen, suction and manual treatment of airway obstruction as needed for comfort.   Comments MOST form on chart.      03/26/18 1440    Code Status History    Date Active Date Inactive Code Status Order ID Comments User Context   03/25/2018 1559 03/26/2018 1440 Full Code [id number]  Demetrios Loll, MD Inpatient   08/01/2016 1526 08/02/2016 1450 Full Code [id number]  Isaias Cowman, MD Inpatient   05/30/2016 2018 06/01/2016 1721 Full Code [id number]  Hillary Bow, MD ED    Advance Directive Documentation     Most Recent Value  Type of Advance Directive  Healthcare Power of Attorney  Pre-existing out of facility DNR order (yellow form or pink MOST form)  -  "MOST" Form in Place?  -       Prognosis:   Poor. Extensive bilateral DVT. Unable to take Eliquis. Dementia.   Discharge Planning:  To Be Determined   Thank you for allowing the Palliative Medicine Team to assist in the care of this patient.   Time In: 10:00 Time Out: 11:10 Total Time 70 min Prolonged Time Billed  yes       Greater than 50%  of this time was spent counseling and coordinating care related to the above assessment and plan.  Asencion Gowda, NP  Please contact Palliative Medicine Team phone at [phone] for questions and concerns.

## 2018-03-27 NOTE — Evaluation (Addendum)
Physical Therapy Evaluation Patient Details Name: Shelley Cameron MRN: [id number] DOB: 01/11/1930 Today's Date: 03/27/2018   History of Present Illness  Patient is 84 yo female that presents to ED with b/l leg pain and decreased mobility. Recently seen for seizure activity. Workup showed b/l leg DVTs and PE found IVC filter placed 03/26/18. PMH includes afib, arthritis, breast cancer, DM, gout, heart murmur, HLD, HTN, moderate dementia, seizures, stroke, CHF.   Clinical Impression  Patient able to state name and birthday with verbal encouragement from PT and family, able to correctly identify location, but not oriented to time/situation, family reports this is her baseline cognitive status. Patient able to answer intermittently about PLOF and home, but family needed to correct misinformation. Patient needs assistance/dependent for ADLs, IADLs, and recently has had a decline in mobility. Patient in the past has been able to ambulate to chair with at least mod assist and RW and maximum encouragement, but family reports patient has needed more help/refuses mobility more recently. Daughter is primary caregiver and currently living with patient. Patient demonstrated bed mobility maxAx1, and modAx1 to remain in a seated position at EOB. Patient initially attempted to reach for bed rail but unable to hold due to fatigue. Able to sit up 1x with minAx1 with extensive postural cues/visual cues. Sit <> stand transfer attempted maxAx1 and RW, patient unable to achieve standing position, though attempted to pull on walker to stand. Squat pivot to chair maxAx1, total assist needed to reposition in chair. Trial of physical therapy to attempt to return patient to PLOF, pending patient's ability to participate in therapy.    Follow Up Recommendations SNF    Equipment Recommendations  Other (comment)(TBD at next venue of care, patient has 4WW, RW, quad cane)    Recommendations for Other Services OT consult      Precautions / Restrictions Precautions Precautions: Fall Restrictions Weight Bearing Restrictions: No      Mobility  Bed Mobility Overal bed mobility: Needs Assistance Bed Mobility: Supine to Sit     Supine to sit: Max assist;HOB elevated        Transfers Overall transfer level: Needs assistance Equipment used: Rolling walker (2 wheeled);None Transfers: Sit to/from [facility] Sit to Stand: Max assist Stand pivot transfers: Total assist       General transfer comment: Patient unable to achieve full standing position with RW. Squat pivot to chair maxAx1  Ambulation/Gait             General Gait Details: unable/unsafe  Stairs            Wheelchair Mobility    Modified Rankin (Stroke Patients Only)       Balance Overall balance assessment: Needs assistance Sitting-balance support: Feet supported Sitting balance-Leahy Scale: Zero   Postural control: Posterior lean   Standing balance-Leahy Scale: Zero                               Pertinent Vitals/Pain Pain Assessment: Faces Faces Pain Scale: Hurts even more Pain Location: LE with palpation/mobility    Home Living Family/patient expects to be discharged to:: Private residence Living Arrangements: Children Available Help at Discharge: Family Type of Home: House Home Access: Stairs to enter Entrance Stairs-Rails: Can reach both Entrance Stairs-Number of Steps: 2 Home Layout: One level Home Equipment: Cane - single point;Cane - quad;Walker - standard;Walker - 4 wheels;Grab bars - toilet;Grab bars - tub/shower;Shower seat - built in  Prior Function Level of Independence: Needs assistance         Comments: assistance/dependent  with ADLs, dependent for IADLs. Patient family reports that she has not been walking unless family made her walk in the last couple of months, able to walk a few feet to a chair. last week she did not walk at all.     Hand  Dominance   Dominant Hand: Right    Extremity/Trunk Assessment   Upper Extremity Assessment Upper Extremity Assessment: Difficult to assess due to impaired cognition    Lower Extremity Assessment Lower Extremity Assessment: Difficult to assess due to impaired cognition       Communication   Communication: Expressive difficulties  Cognition Arousal/Alertness: Lethargic Behavior During Therapy: Flat affect Overall Cognitive Status: History of cognitive impairments - at baseline                                 General Comments: Patient able to open eyes for brief time, to state that she did not want to move.      General Comments      Exercises     Assessment/Plan    PT Assessment Patient needs continued PT services  PT Problem List Decreased strength;Decreased range of motion;Decreased activity tolerance;Decreased safety awareness;Decreased balance;Decreased knowledge of precautions;Decreased mobility       PT Treatment Interventions DME instruction;Balance training;Gait training;Neuromuscular re-education;Stair training;Functional mobility training;Patient/family education;Therapeutic activities;Therapeutic exercise    PT Goals (Current goals can be found in the Care Plan section)  Acute Rehab PT Goals Patient Stated Goal: Family wants patient to DC with appropriate care and support PT Goal Formulation: With family Time For Goal Achievement: 04/10/18 Potential to Achieve Goals: Good    Frequency Min 2X/week   Barriers to discharge Decreased caregiver support      Co-evaluation               AM-PAC PT "6 Clicks" Daily Activity  Outcome Measure Difficulty turning over in bed (including adjusting bedclothes, sheets and blankets)?: Unable Difficulty moving from lying on back to sitting on the side of the bed? : Unable Difficulty sitting down on and standing up from a chair with arms (e.g., wheelchair, bedside commode, etc,.)?: Unable Help  needed moving to and from a bed to chair (including a wheelchair)?: Total Help needed walking in hospital room?: Total Help needed climbing 3-5 steps with a railing? : Total 6 Click Score: 6    End of Session Equipment Utilized During Treatment: Gait belt Activity Tolerance: Patient limited by fatigue Patient left: with chair alarm set;in chair;with family/visitor present;with call bell/phone within reach;Other (comment)(heels elevated) Nurse Communication: Mobility status PT Visit Diagnosis: Unsteadiness on feet (R26.81);Other abnormalities of gait and mobility (R26.89);Difficulty in walking, not elsewhere classified (R26.2);Muscle weakness (generalized) (M62.81)    Time: 6659-9357 PT Time Calculation (min) (ACUTE ONLY): 28 min   Charges:   PT Evaluation $PT Eval High Complexity: 1 High PT Treatments $Therapeutic Activity: 8-22 mins        Lieutenant Diego PT, DPT 12:12 PM,03/27/18 [phone]

## 2018-03-28 DIAGNOSIS — R4182 Altered mental status, unspecified: Secondary | ICD-10-CM | POA: Diagnosis not present

## 2018-03-28 DIAGNOSIS — R531 Weakness: Secondary | ICD-10-CM | POA: Diagnosis not present

## 2018-03-28 DIAGNOSIS — E785 Hyperlipidemia, unspecified: Secondary | ICD-10-CM | POA: Diagnosis not present

## 2018-03-28 DIAGNOSIS — I4891 Unspecified atrial fibrillation: Secondary | ICD-10-CM | POA: Diagnosis not present

## 2018-03-28 DIAGNOSIS — Z5189 Encounter for other specified aftercare: Secondary | ICD-10-CM | POA: Diagnosis not present

## 2018-03-28 DIAGNOSIS — G40401 Other generalized epilepsy and epileptic syndromes, not intractable, with status epilepticus: Secondary | ICD-10-CM | POA: Diagnosis not present

## 2018-03-28 DIAGNOSIS — I2699 Other pulmonary embolism without acute cor pulmonale: Secondary | ICD-10-CM | POA: Diagnosis not present

## 2018-03-28 DIAGNOSIS — F0390 Unspecified dementia without behavioral disturbance: Secondary | ICD-10-CM | POA: Diagnosis not present

## 2018-03-28 DIAGNOSIS — N390 Urinary tract infection, site not specified: Secondary | ICD-10-CM | POA: Diagnosis not present

## 2018-03-28 DIAGNOSIS — M6281 Muscle weakness (generalized): Secondary | ICD-10-CM | POA: Diagnosis not present

## 2018-03-28 DIAGNOSIS — I82409 Acute embolism and thrombosis of unspecified deep veins of unspecified lower extremity: Secondary | ICD-10-CM | POA: Diagnosis not present

## 2018-03-28 DIAGNOSIS — Z8639 Personal history of other endocrine, nutritional and metabolic disease: Secondary | ICD-10-CM | POA: Diagnosis not present

## 2018-03-28 DIAGNOSIS — Z8669 Personal history of other diseases of the nervous system and sense organs: Secondary | ICD-10-CM | POA: Diagnosis not present

## 2018-03-28 DIAGNOSIS — Z7401 Bed confinement status: Secondary | ICD-10-CM | POA: Diagnosis not present

## 2018-03-28 DIAGNOSIS — D649 Anemia, unspecified: Secondary | ICD-10-CM | POA: Diagnosis not present

## 2018-03-28 DIAGNOSIS — I10 Essential (primary) hypertension: Secondary | ICD-10-CM | POA: Diagnosis not present

## 2018-03-28 DIAGNOSIS — I82403 Acute embolism and thrombosis of unspecified deep veins of lower extremity, bilateral: Secondary | ICD-10-CM | POA: Diagnosis not present

## 2018-03-28 DIAGNOSIS — I82493 Acute embolism and thrombosis of other specified deep vein of lower extremity, bilateral: Secondary | ICD-10-CM | POA: Diagnosis not present

## 2018-03-28 DIAGNOSIS — M1000 Idiopathic gout, unspecified site: Secondary | ICD-10-CM | POA: Diagnosis not present

## 2018-03-28 DIAGNOSIS — R41841 Cognitive communication deficit: Secondary | ICD-10-CM | POA: Diagnosis not present

## 2018-03-28 LAB — GLUCOSE, CAPILLARY
GLUCOSE-CAPILLARY: 83 mg/dL (ref 70–99)
Glucose-Capillary: 88 mg/dL (ref 70–99)
Glucose-Capillary: 105 mg/dL — ABNORMAL HIGH (ref 70–99)

## 2018-03-28 MED ORDER — BISACODYL 10 MG RE SUPP
10.0000 mg | Freq: Every day | RECTAL | Status: DC | PRN
  Administered 2018-03-28: 10 mg via RECTAL
  Filled 2018-03-28: qty 1

## 2018-03-28 NOTE — Care Management (Signed)
[name] with [facility] notified that patient to discharge to [facility] today.

## 2018-03-28 NOTE — Discharge Summary (Signed)
[facility] [place] NAME: Shelley Cameron    MR#:  [id number]  DATE OF BIRTH:  16-Jun-1930  DATE OF ADMISSION:  03/25/2018 ADMITTING PHYSICIAN: Demetrios Loll, MD  DATE OF DISCHARGE: 03/28/2018  PRIMARY CARE PHYSICIAN: Adin Hector, MD    ADMISSION DIAGNOSIS:  Acute encephalopathy [G93.40]  DISCHARGE DIAGNOSIS:  Active Problems:   DVT (deep vein thrombosis) in pregnancy ([place])   SECONDARY DIAGNOSIS:   Past Medical History:  Diagnosis Date  . A-fib ([place])   . Anemia   . Arthritis   . Cancer ([place]) 1998, 1999   breast cancer. Bilateral mastectomy.   . Diabetes mellitus without complication ([place])   . DJD (degenerative joint disease)   . Gout   . Heart murmur   . Hyperlipidemia   . Hypertension   . Lymphedema of right arm   . Moderate dementia   . Seizures ([place])    Tonic-Clonic seizure activity  . Severe mitral insufficiency   . Stroke [facility]) 2011  . Syncope   . Systolic CHF ([place])   . Urinary incontinence     HOSPITAL COURSE:   84 year old history of hypertension, hyperlipidemia, seizures, previous CVA, syncope, systolic CHF, diabetes, chronic atrial fibrillation, recent fall with a traumatic intracranial bleed who presents to the hospital due to lower extremity pain and noted to have extensive bilateral DVT, along with pulmonary embolism.  1.  DVT/pulmonary embolism-this was a provoked VTE given patient's poor mobility and advanced dementia. - Patient is a poor candidate for anticoagulation given her recent history of a traumatic fall with intracranial bleed.   -Patient is status post IVC filter placement now.  Patient denies any chest pains, shortness of breath, hemoptysis, leg pain presently in stable.  2.  Advanced dementia-Continue Aricept, Namenda, Seroquel, Effexor -Given patient's comorbidities and advanced dementia palliative care consult obtained during hospital course to discuss goals of care.  Patient is a DNR now  and is being discharged to a skilled nursing facility with palliative to follow.  3.  hx of seizures-continue Keppra. -No acute seizures while in the hospital.  4.  Essential hypertension- she will continue metoprolol.  5.  History of gout- she will continue colchicine. - no acute attack while in the hospital.   6. Hyperlipidemia - she will cont. Her Lovastatin.     DISCHARGE CONDITIONS:   Stable.   CONSULTS OBTAINED:  Treatment Team:  Samara Snide, MD  DRUG ALLERGIES:   Allergies  Allergen Reactions  . Codeine Nausea Only    DISCHARGE MEDICATIONS:   Allergies as of 03/28/2018      Reactions   Codeine Nausea Only      Medication List    STOP taking these medications   conjugated estrogens vaginal cream Commonly known as:  PREMARIN   estradiol 0.1 MG/GM vaginal cream Commonly known as:  ESTRACE VAGINAL     TAKE these medications   acetaminophen 500 MG tablet Commonly known as:  TYLENOL Take 1,000 mg by mouth every 6 (six) hours as needed for mild pain.   cetirizine 10 MG tablet Commonly known as:  ZYRTEC Take 10 mg by mouth daily as needed for allergies.   colchicine 0.6 MG tablet Take 0.6 mg by mouth as needed (Gout).   donepezil 10 MG tablet Commonly known as:  ARICEPT Take 10 mg by mouth at bedtime.   lamoTRIgine 25 MG tablet Commonly known as:  LAMICTAL Take 25 mg by  mouth 2 (two) times daily.   levETIRAcetam 750 MG tablet Commonly known as:  KEPPRA Take 750 mg by mouth 2 (two) times daily.   loratadine 10 MG tablet Commonly known as:  CLARITIN Take 10 mg by mouth daily as needed for allergies.   lovastatin 40 MG tablet Commonly known as:  MEVACOR Take 40 mg by mouth at bedtime.   memantine 5 MG tablet Commonly known as:  NAMENDA Take 5 mg by mouth 2 (two) times daily.   metoprolol tartrate 25 MG tablet Commonly known as:  LOPRESSOR Take 12.5 mg by mouth 2 (two) times daily.   QUEtiapine 50 MG  tablet Commonly known as:  SEROQUEL Take 50 mg by mouth at bedtime.   venlafaxine XR 37.5 MG 24 hr capsule Commonly known as:  EFFEXOR-XR Take 37.5 mg by mouth daily.   vitamin B-12 1000 MCG tablet Commonly known as:  CYANOCOBALAMIN Take 1,000 mcg by mouth daily.   Vitamin D 2000 units Caps Take 2,000 Units by mouth daily.         DISCHARGE INSTRUCTIONS:   DIET:  Cardiac diet  DISCHARGE CONDITION:  Stable  ACTIVITY:  Activity as tolerated  OXYGEN:  Home Oxygen: No.   Oxygen Delivery: room air  DISCHARGE LOCATION:  nursing home   If you experience worsening of your admission symptoms, develop shortness of breath, life threatening emergency, suicidal or homicidal thoughts you must seek medical attention immediately by calling 911 or calling your MD immediately  if symptoms less severe.  You Must read complete instructions/literature along with all the possible adverse reactions/side effects for all the Medicines you take and that have been prescribed to you. Take any new Medicines after you have completely understood and accpet all the possible adverse reactions/side effects.   Please note  You were cared for by a hospitalist during your hospital stay. If you have any questions about your discharge medications or the care you received while you were in the hospital after you are discharged, you can call the unit and asked to speak with the hospitalist on call if the hospitalist that took care of you is not available. Once you are discharged, your primary care physician will handle any further medical issues. Please note that NO REFILLS for any discharge medications will be authorized once you are discharged, as it is imperative that you return to your primary care physician (or establish a relationship with a primary care physician if you do not have one) for your aftercare needs so that they can reassess your need for medications and monitor your lab values.     Today    No acute events overnight, patient denies any chest pains, shortness of breath.  She is status post IVC filter placement.  Daughter is at bedside.  Stable to be discharged to skilled nursing facility.  VITAL SIGNS:  Blood pressure (!) 112/56, pulse (!) 103, temperature 98.5 F (36.9 C), resp. rate 16, height 5\' 3"  (1.6 m), weight 64.9 kg (143 lb), SpO2 100 %.  I/O:  No intake or output data in the 24 hours ending 03/28/18 1219  PHYSICAL EXAMINATION:   GENERAL:  84 y.o.-year-old patient lying in bed in no acute distress.  EYES: Pupils equal, round, reactive to light and accommodation. No scleral icterus. Extraocular muscles intact.  HEENT: Head atraumatic, normocephalic. Oropharynx and nasopharynx clear.  NECK:  Supple, no jugular venous distention. No thyroid enlargement, no tenderness.  LUNGS: Normal breath sounds bilaterally, no wheezing, rales, rhonchi. No use  of accessory muscles of respiration.  CARDIOVASCULAR: S1, S2 normal. No murmurs, rubs, or gallops.  ABDOMEN: Soft, nontender, nondistended. Bowel sounds present. No organomegaly or mass.  EXTREMITIES: No cyanosis, clubbing, +1 edema b/l.    NEUROLOGIC: Cranial nerves II through XII are intact. No focal Motor or sensory deficits b/l.  Globally weak.  PSYCHIATRIC: The patient is alert and oriented x 1.  SKIN: No obvious rash, lesion, or ulcer.   DATA REVIEW:   CBC Recent Labs  Lab 03/26/18 0407  WBC 11.6*  HGB 10.6*  HCT 32.1*  PLT 249    Chemistries  Recent Labs  Lab 03/25/18 1021 03/26/18 0407  NA 141 142  K 4.5 4.3  CL 105 106  CO2 28 29  GLUCOSE 103* 98  BUN 17 16  CREATININE 1.24* 1.07*  CALCIUM 8.5* 8.2*  MG 2.1  --   AST 36  --   ALT 26  --   ALKPHOS 116  --   BILITOT 0.9  --     Cardiac Enzymes Recent Labs  Lab 03/27/18 0115  TROPONINI <0.03    Microbiology Results  Results for orders placed or performed during the hospital encounter of 03/06/18  Urine culture     Status: None    Collection Time: 03/06/18  9:35 AM  Result Value Ref Range Status   Specimen Description   Final    URINE, RANDOM Performed at [facility], [address], [place], [place] [address]    Special Requests   Final    NONE Performed at [facility], [address], [place], [place] [address]    Culture   Final    NO GROWTH Performed at [facility], [place] [address] [place], [place] [address]    Report Status 03/07/2018 FINAL  Final    RADIOLOGY:  No results found.    Management plans discussed with the patient, family and they are in agreement.  CODE STATUS:     Code Status Orders  (From admission, onward)        Start     Ordered   03/26/18 1440  Do not attempt resuscitation (DNR)  Continuous    Question Answer Comment  In the event of cardiac or respiratory ARREST Do not call a "code blue"   In the event of cardiac or respiratory ARREST Do not perform Intubation, CPR, defibrillation or ACLS   In the event of cardiac or respiratory ARREST Use medication by any route, position, wound care, and other measures to relive pain and suffering. May use oxygen, suction and manual treatment of airway obstruction as needed for comfort.   Comments MOST form on chart.      03/26/18 1440    Advance Directive Documentation     Most Recent Value  Type of Advance Directive  Healthcare Power of Attorney  Pre-existing out of facility DNR order (yellow form or pink MOST form)  -  "MOST" Form in Place?  -      TOTAL TIME TAKING CARE OF THIS PATIENT: 40 minutes.    Henreitta Leber M.D on 03/28/2018 at 12:19 PM  Between 7am to 6pm - Pager - [phone]  After 6pm go to [email or web address] - Proofreader  [facility]  Office  [phone]  CC: Primary care physician; Adin Hector, MD

## 2018-03-28 NOTE — Progress Notes (Signed)
Report called to facility,  [facility] LPN .EMS called for transport. Family informed at bedside.

## 2018-03-28 NOTE — Care Management Important Message (Signed)
Copy of signed IM left with patient in room.  

## 2018-04-01 ENCOUNTER — Other Ambulatory Visit: Payer: Self-pay | Admitting: *Deleted

## 2018-04-01 DIAGNOSIS — I10 Essential (primary) hypertension: Secondary | ICD-10-CM | POA: Diagnosis not present

## 2018-04-01 DIAGNOSIS — Z8669 Personal history of other diseases of the nervous system and sense organs: Secondary | ICD-10-CM | POA: Diagnosis not present

## 2018-04-01 DIAGNOSIS — F0390 Unspecified dementia without behavioral disturbance: Secondary | ICD-10-CM | POA: Diagnosis not present

## 2018-04-01 DIAGNOSIS — Z8639 Personal history of other endocrine, nutritional and metabolic disease: Secondary | ICD-10-CM | POA: Diagnosis not present

## 2018-04-01 DIAGNOSIS — I4891 Unspecified atrial fibrillation: Secondary | ICD-10-CM | POA: Diagnosis not present

## 2018-04-01 DIAGNOSIS — I82403 Acute embolism and thrombosis of unspecified deep veins of lower extremity, bilateral: Secondary | ICD-10-CM | POA: Diagnosis not present

## 2018-04-01 DIAGNOSIS — I2699 Other pulmonary embolism without acute cor pulmonale: Secondary | ICD-10-CM | POA: Diagnosis not present

## 2018-04-01 DIAGNOSIS — E785 Hyperlipidemia, unspecified: Secondary | ICD-10-CM | POA: Diagnosis not present

## 2018-04-01 NOTE — Patient Outreach (Addendum)
[facility] [facility]) Care Management  04/01/2018  RAYEL SANTIZO Oct 20, 1929 [id number]   EMMI-  general discharge      RED ON EMMI ALERT Day # 1 Date: 03/31/18 Red Alert Reason: read discharge papers? No Taking meds? No   Outreach attempt # 1 and her daughter, [name] was reached  [name] was able to verify HIPAA [facility] Care Management RN reviewed and addressed red alert with patient [name] confirms with [facility] RN CM that Ms Zegarra was NOT discharged home  She was discharged on 03/28/18 to [facility] where she remains at this time [name] (who has laryngitis) reports she answered the 03/31/18 EMMI call and the answers were as correct as possible. There were no discharge instructions to read and she is being administered medications by the facility     Advised patient that there will be further automated EMMI- post discharge calls to assess how the patient is doing following the recent hospitalization Advised the patient that another call may be received from a nurse if any of their responses were abnormal. Patient voiced understanding and was appreciative of f/u call.  Plan: [facility] RN CM will close this case as this patient is not home but at a facility and enrolled in an external program  [facility] RN CM sent an in basket message to [facility] CMA, Mickel Baas to provide an update about pt admission to [facility] vs home (EMMI)  [name] L. Lavina Hamman, RN, BSN, CCM [facility] Telephonic Care Management Care Coordinator Direct number [phone]  Main [facility] number [phone] Fax number [phone]

## 2018-04-09 DIAGNOSIS — F0390 Unspecified dementia without behavioral disturbance: Secondary | ICD-10-CM | POA: Diagnosis not present

## 2018-04-09 DIAGNOSIS — E785 Hyperlipidemia, unspecified: Secondary | ICD-10-CM | POA: Diagnosis not present

## 2018-04-09 DIAGNOSIS — D649 Anemia, unspecified: Secondary | ICD-10-CM | POA: Diagnosis not present

## 2018-04-09 DIAGNOSIS — I2699 Other pulmonary embolism without acute cor pulmonale: Secondary | ICD-10-CM | POA: Diagnosis not present

## 2018-04-09 DIAGNOSIS — I4891 Unspecified atrial fibrillation: Secondary | ICD-10-CM | POA: Diagnosis not present

## 2018-04-09 DIAGNOSIS — I10 Essential (primary) hypertension: Secondary | ICD-10-CM | POA: Diagnosis not present

## 2018-04-09 DIAGNOSIS — Z8669 Personal history of other diseases of the nervous system and sense organs: Secondary | ICD-10-CM | POA: Diagnosis not present

## 2018-04-09 DIAGNOSIS — I82403 Acute embolism and thrombosis of unspecified deep veins of lower extremity, bilateral: Secondary | ICD-10-CM | POA: Diagnosis not present

## 2018-04-18 DIAGNOSIS — I482 Chronic atrial fibrillation: Secondary | ICD-10-CM | POA: Diagnosis not present

## 2018-04-18 DIAGNOSIS — R296 Repeated falls: Secondary | ICD-10-CM | POA: Diagnosis not present

## 2018-04-18 DIAGNOSIS — I110 Hypertensive heart disease with heart failure: Secondary | ICD-10-CM | POA: Diagnosis not present

## 2018-04-18 DIAGNOSIS — E119 Type 2 diabetes mellitus without complications: Secondary | ICD-10-CM | POA: Diagnosis not present

## 2018-04-18 DIAGNOSIS — I509 Heart failure, unspecified: Secondary | ICD-10-CM | POA: Diagnosis not present

## 2018-04-18 DIAGNOSIS — Z87820 Personal history of traumatic brain injury: Secondary | ICD-10-CM | POA: Diagnosis not present

## 2018-04-18 DIAGNOSIS — R2689 Other abnormalities of gait and mobility: Secondary | ICD-10-CM | POA: Diagnosis not present

## 2018-04-18 DIAGNOSIS — F0391 Unspecified dementia with behavioral disturbance: Secondary | ICD-10-CM | POA: Diagnosis not present

## 2018-04-18 DIAGNOSIS — G40909 Epilepsy, unspecified, not intractable, without status epilepticus: Secondary | ICD-10-CM | POA: Diagnosis not present

## 2018-04-21 ENCOUNTER — Other Ambulatory Visit: Payer: Self-pay | Admitting: *Deleted

## 2018-04-21 DIAGNOSIS — F0391 Unspecified dementia with behavioral disturbance: Secondary | ICD-10-CM | POA: Diagnosis not present

## 2018-04-21 DIAGNOSIS — Z515 Encounter for palliative care: Secondary | ICD-10-CM | POA: Diagnosis not present

## 2018-04-21 NOTE — Patient Outreach (Signed)
[place] [facility]) Care Management  04/21/2018  MECHILLE VARGHESE 1930/07/26 [id number]   EMMI-       RED ON EMMI ALERT Day # 1 Date: 04/18/18 Red Alert Reason: read discharge papers/ No  Know who to call about changes in condition? No Other questions/problems? Yes    Outreach attempt # 1 successful to Ms Hemm and her daughter, charita Patient is able to verify HIPAA [facility] Care Management RN reviewed and addressed red alert with patient [name] states she is answering the EMMI  Calls for Ms Cavness She reports Ms Lebo was giving PT at [facility] qd as much as possible considering Ms Ezzell has per [facility]  Blood clots in both legs and lungs [name] reports Mrs Harman's 20 [facility] were [facility] and she was informed on 04/15/18 of the d/c date of 04/17/18, Thursday, to home with [place] is living with [place], daughter and Cornelia Copa. [name] reports Ms Huck as total care.  Ms Harkless is unable to move her legs and "hollers a lot" related to the pain in her legs.  [name] reports Ms Mehlman is not able to be "on blood thinners" [facility] involved. Ben [facility] PT plus a SW, Atha Starks and RN, Magda Paganini saw Ms Tigue this am (left at about 1130) . Sw to be assisting with Ms [facility] application to possible assist with palliative/hospice or long term snf placement. Dr Caryl Comes is aware. They have a transport chair in which [name] has been able to get Ms Maillet up in and where Ms Chestang is at during the time of the call   CM discussed the d/c papers from [facility] and encouraged [name] to call Dr Caryl Comes for all concerns Discussed the general process for referral to palliative/hospice care services  Advised patient that there will be further automated EMMI- post discharge calls to assess how the patient is doing following the recent hospitalization Advised the patient that another call may be received from a nurse if any of their responses were abnormal.  Patient voiced understanding and was appreciative of f/u call.  Plan: [facility] RN CM left a message for E Howell, [facility]  [phone] inquiring if [place] could assist with patient care pending a return call  [facility] RN CM left a message for [place] [phone] that E [employer] referred CM to if no answer  Eiko Mcgowen L. Lavina Hamman, RN, BSN, [place] Management Care Coordinator Direct Number [phone] Mobile number [phone]  Main [facility] number [phone] Fax number [phone]

## 2018-04-22 DIAGNOSIS — M79675 Pain in left toe(s): Secondary | ICD-10-CM | POA: Diagnosis not present

## 2018-04-22 DIAGNOSIS — M79674 Pain in right toe(s): Secondary | ICD-10-CM | POA: Diagnosis not present

## 2018-04-22 DIAGNOSIS — B351 Tinea unguium: Secondary | ICD-10-CM | POA: Diagnosis not present

## 2018-04-22 DIAGNOSIS — E1142 Type 2 diabetes mellitus with diabetic polyneuropathy: Secondary | ICD-10-CM | POA: Diagnosis not present

## 2018-04-23 ENCOUNTER — Other Ambulatory Visit: Payer: Self-pay | Admitting: *Deleted

## 2018-04-23 DIAGNOSIS — F0391 Unspecified dementia with behavioral disturbance: Secondary | ICD-10-CM | POA: Diagnosis not present

## 2018-04-23 DIAGNOSIS — I824Y3 Acute embolism and thrombosis of unspecified deep veins of proximal lower extremity, bilateral: Secondary | ICD-10-CM | POA: Diagnosis not present

## 2018-04-23 DIAGNOSIS — I482 Chronic atrial fibrillation: Secondary | ICD-10-CM | POA: Diagnosis not present

## 2018-04-23 DIAGNOSIS — R410 Disorientation, unspecified: Secondary | ICD-10-CM | POA: Diagnosis not present

## 2018-04-23 DIAGNOSIS — E119 Type 2 diabetes mellitus without complications: Secondary | ICD-10-CM | POA: Diagnosis not present

## 2018-04-23 DIAGNOSIS — I824Y2 Acute embolism and thrombosis of unspecified deep veins of left proximal lower extremity: Secondary | ICD-10-CM | POA: Diagnosis not present

## 2018-04-23 DIAGNOSIS — I5020 Unspecified systolic (congestive) heart failure: Secondary | ICD-10-CM | POA: Diagnosis not present

## 2018-04-23 NOTE — Patient Outreach (Signed)
[place] [facility]) Care Management  04/23/2018  Shelley Cameron 1930-02-16 [id number]   Care coordination  [facility] RN CM received a call from Merri Ray, Palliative care SW who confirms she and Magda Paganini, NP visited [name] and [name], Daughter on 04/21/18 am E Lavone Neri confirms a discussion of palliative care, medicaid application started, encouraged [name] to go to DSS, NP recommendation for seroquel change to primary MD, and the plan to f/u in 2 weeks. [facility] RN CM discussed how [facility] was involved related to EMMI general discharge encounter.    Plans [facility] RN CM to follow up with [name] and [name] for the second EMMI general discharge red alert today and assess for any possible needs [facility] can assist with   [name] L. Lavina Hamman, RN, BSN, [place] Coordinator Office number [phone] Mobile number [phone]  Main [facility] number [phone] Fax number [phone]

## 2018-04-23 NOTE — Patient Outreach (Signed)
[place] [facility]) Care Management  04/23/2018  Shelley Cameron 11-Jun-1930 [id number]   EMMI-     General Discharge - 2nd referral this week                                                           RED ON EMMI ALERT Day #4 Date: 04/21/18 Red Alert Reason: lost interest in things? Yes Sad/hopeless/anxious/empty? Yes   Outreach attempt # 1 for this new referral from 04/21/18  successful to [name] and her daughter, Shelley Cameron Patient's daughter is able to verify HIPAA [facility] Care Management RN reviewed and addressed red alertwith patient Shelley Cameron states they are at the primary MD office at this time and requests a return call CM reminded her to inquire about home health aide order from MD to assist her at home with pt care  Plans [facility] RN CM will attempt to return a call to day to [place] and [name] L. Lavina Hamman, RN, BSN, [place] Coordinator Office number [phone] Mobile number [phone]  Main [facility] number [phone] Fax number [phone]

## 2018-04-24 DIAGNOSIS — Z8661 Personal history of infections of the central nervous system: Secondary | ICD-10-CM | POA: Diagnosis not present

## 2018-04-24 DIAGNOSIS — R2689 Other abnormalities of gait and mobility: Secondary | ICD-10-CM | POA: Diagnosis not present

## 2018-04-24 DIAGNOSIS — E119 Type 2 diabetes mellitus without complications: Secondary | ICD-10-CM | POA: Diagnosis not present

## 2018-04-24 DIAGNOSIS — R296 Repeated falls: Secondary | ICD-10-CM | POA: Diagnosis not present

## 2018-04-24 DIAGNOSIS — F0391 Unspecified dementia with behavioral disturbance: Secondary | ICD-10-CM | POA: Diagnosis not present

## 2018-04-24 DIAGNOSIS — N399 Disorder of urinary system, unspecified: Secondary | ICD-10-CM | POA: Diagnosis not present

## 2018-04-24 DIAGNOSIS — I509 Heart failure, unspecified: Secondary | ICD-10-CM | POA: Diagnosis not present

## 2018-04-24 DIAGNOSIS — G40909 Epilepsy, unspecified, not intractable, without status epilepticus: Secondary | ICD-10-CM | POA: Diagnosis not present

## 2018-04-24 DIAGNOSIS — Z87820 Personal history of traumatic brain injury: Secondary | ICD-10-CM | POA: Diagnosis not present

## 2018-04-24 DIAGNOSIS — I110 Hypertensive heart disease with heart failure: Secondary | ICD-10-CM | POA: Diagnosis not present

## 2018-04-24 DIAGNOSIS — N390 Urinary tract infection, site not specified: Secondary | ICD-10-CM | POA: Diagnosis not present

## 2018-04-24 DIAGNOSIS — I482 Chronic atrial fibrillation: Secondary | ICD-10-CM | POA: Diagnosis not present

## 2018-04-25 ENCOUNTER — Other Ambulatory Visit: Payer: Self-pay | Admitting: *Deleted

## 2018-04-25 NOTE — Patient Outreach (Addendum)
[place] [facility]) Care Management  04/25/2018  Shelley Cameron 20-Feb-1930 [id number]   EMMI-General Discharge - 2nd referral this week  RED ON EMMI ALERT Day #4 Date:04/21/18 Red Alert Reason:lost interest in things? Yes Sad/hopeless/anxious/empty? Yes   Outreach attempt #2 Patient's daughter is able to verify HIPAA [facility] Care Management RN reviewed and addressed red alert with patient's daughter as the patient has dementia [name] confirms the answers to the EMMI questions on 04/21/18 are correct. Ms Shelley Cameron is experiencing lost of interest in things plus sad/hopeless/anxious/empty s/s related to having dementia, bilateral blood clots, being in pain and cared for by hospice (SW, NP). She is receiving Aricept  [name] confirms she spoke with the NP on 04/23/18 about home health aide [facility] RN visited on 04/24/18 to get an in/out cath urine specimen after labs indicated an increase in wbcs, and the RN will return on Tuesday 04/29/18  Ms Shelley Cameron ws placed on an antibiotic for UTI The NP also ordered a hospital bed [facility] RN CM discussed the differences in palliative and hospice care with [name] and encouraged her to speak with the palliative team Raquel Sarna SW and Magda Paganini, NP) about respite care and care giver resources especially considering that Ms Shelley Cameron's appetite is poor for solids and fluids plus [name] reports not having a stool in more than 3 days even with use of stool softeners and miralax or Citrucel [name] has a DNR form She reports she is POA [name] states she was informed Ms Shelley Cameron is not terminal at this time but she feels that Ms Shelley Cameron is declining related to poor appetite, screaming out at night but not indicating anything is wrong when assessed   Advised patient that there will be further automated EMMI- post discharge calls to assess how the  patient is doing following the recent hospitalization Advised the patient that another call may be received from a nurse if any of their responses were abnormal. Patient voiced understanding and was appreciative of f/u call.  Consent: [facility] RN CM reviewed [facility] services with patient. Patient gave verbal consent for services for [facility] [place] RN CM related to end stage illness, dependent with ADLs, disease management and family education, Homebound, admission x 1 and 5 ED visits in the last 6 months  Plans: [facility] RN CM will refer Ms Shelley Cameron to [place]. Lavina Hamman, RN, BSN, [place] Coordinator Office number [phone] Mobile number [phone]  Main [facility] number [phone] Fax number [phone]

## 2018-04-30 ENCOUNTER — Other Ambulatory Visit: Payer: Self-pay

## 2018-04-30 DIAGNOSIS — F0391 Unspecified dementia with behavioral disturbance: Secondary | ICD-10-CM | POA: Diagnosis not present

## 2018-04-30 DIAGNOSIS — I110 Hypertensive heart disease with heart failure: Secondary | ICD-10-CM | POA: Diagnosis not present

## 2018-04-30 DIAGNOSIS — N390 Urinary tract infection, site not specified: Secondary | ICD-10-CM | POA: Diagnosis not present

## 2018-04-30 DIAGNOSIS — E119 Type 2 diabetes mellitus without complications: Secondary | ICD-10-CM | POA: Diagnosis not present

## 2018-04-30 DIAGNOSIS — R2689 Other abnormalities of gait and mobility: Secondary | ICD-10-CM | POA: Diagnosis not present

## 2018-04-30 DIAGNOSIS — D649 Anemia, unspecified: Secondary | ICD-10-CM | POA: Diagnosis not present

## 2018-04-30 DIAGNOSIS — G40909 Epilepsy, unspecified, not intractable, without status epilepticus: Secondary | ICD-10-CM | POA: Diagnosis not present

## 2018-04-30 DIAGNOSIS — I972 Postmastectomy lymphedema syndrome: Secondary | ICD-10-CM | POA: Diagnosis not present

## 2018-04-30 DIAGNOSIS — I5020 Unspecified systolic (congestive) heart failure: Secondary | ICD-10-CM | POA: Diagnosis not present

## 2018-04-30 NOTE — Patient Outreach (Signed)
[place]) Care Management  04/30/2018  WAUNETTA RIGGLE 1930-05-26 [id number]   Care Coordination: Successful telephone encounter to [name], the above patients daughter and full-time caregiver. [facility] RN CM introduced role of [facility] Community Case Management. Verbal consent obtained. Initial home visit scheduled for Friday 05/02/18 at 9:30. Ms. Lennox Grumbles states mothers dementia worsening. Continues to "scream out" as if in pain. Refuses to stand and is now bed bound. [place] RN scheduled to come out today. Ms. Lennox Grumbles also awaiting delivery of hospital bed. Will follow up with patient and caregiver needs during initial home visit.  Leavy Heatherly E. Rollene Rotunda RN, BSN [facility] Care Management Coordinator [phone]

## 2018-05-02 ENCOUNTER — Other Ambulatory Visit: Payer: Self-pay

## 2018-05-02 DIAGNOSIS — R2689 Other abnormalities of gait and mobility: Secondary | ICD-10-CM | POA: Diagnosis not present

## 2018-05-02 DIAGNOSIS — F0391 Unspecified dementia with behavioral disturbance: Secondary | ICD-10-CM | POA: Diagnosis not present

## 2018-05-02 DIAGNOSIS — I110 Hypertensive heart disease with heart failure: Secondary | ICD-10-CM | POA: Diagnosis not present

## 2018-05-02 DIAGNOSIS — I972 Postmastectomy lymphedema syndrome: Secondary | ICD-10-CM | POA: Diagnosis not present

## 2018-05-02 DIAGNOSIS — D649 Anemia, unspecified: Secondary | ICD-10-CM | POA: Diagnosis not present

## 2018-05-02 DIAGNOSIS — I5020 Unspecified systolic (congestive) heart failure: Secondary | ICD-10-CM | POA: Diagnosis not present

## 2018-05-02 DIAGNOSIS — E119 Type 2 diabetes mellitus without complications: Secondary | ICD-10-CM | POA: Diagnosis not present

## 2018-05-02 DIAGNOSIS — N390 Urinary tract infection, site not specified: Secondary | ICD-10-CM | POA: Diagnosis not present

## 2018-05-02 DIAGNOSIS — G40909 Epilepsy, unspecified, not intractable, without status epilepticus: Secondary | ICD-10-CM | POA: Diagnosis not present

## 2018-05-02 NOTE — Patient Outreach (Signed)
[facility]) Care Management   05/02/2018  Shelley Cameron Jan 17, 1930 [id number]  Shelley Cameron is an 84 y.o. female  Subjective: Patient did not verbally contribute to initial assessment. Patient inappropriately asking "where is your husband" to [facility] RN. Patient able to state daughters name when asked. Patient communicates "no" when asked if she knows date, time, year, address, ect. Patients daughter states patient is now non-ambulatory and chair or bed bound. States patient does not bare weight on legs and screams out in pain when daughter attempts to have her stand. Daughter having to manually lift patient to put in wheel chair for meals. Daughter states palliative care is active and suggested daughter leave patient in bed. Cameron bed delivered yesterday. Daughter cancelled order for hoyer lift after Shelley Cameron delivery person told her lift would not fit in patient room related to current furniture arrangement. States [facility] is also currently active.   Daughter states patient will not allow her to check CBG or BP. Daughter admits to dementia and refusal of care getting worse. Cameron PT no longer in home as patient has failed to participate/progress in care plan. Daughter states she will no longer take patient to MD appointments related to difficulty transporting and looking for options for in-home physician care.   Daughter states patient needs long term care however currently has 900.00/month over allowed income. Daughter has applied for long-term medicaid. Received call earlier this week and SS office requested additional documents. Daughter will provide requested documents. Daughter acknowledges patient requires a lot of physical care and long term care more appropriate.  Objective:   BP 138/84 (BP Location: Left Arm, Patient Position: Sitting, Cuff Size: Normal)   Pulse 84   Resp 17   SpO2 96%   Physical Exam  Constitutional: She appears well-developed and well-nourished.   Cardiovascular: Normal rate and normal heart sounds.  Irregular rhythm   Respiratory: Effort normal and breath sounds normal. No respiratory distress.  GI: Soft. Bowel sounds are normal.  Musculoskeletal: She exhibits edema.  Right arm lymphedema noted  Neurological:  Patient unable state day, month, year, address. Daughter states patient sometimes does not know who she is. Today patient able to state daughters first name but not last.  Skin: Skin is warm and dry.  Left heal firm, cracked. Right heal firm, raised callused area right lateral aspect  Soft socks and heal protectors on  Psychiatric: Her behavior is normal.  Flat affect. Laughs when others laugh. Unable to assess thought content or judgement.    Encounter Medications:   Outpatient Encounter Medications as of 05/02/2018  Medication Sig Note  . acetaminophen (TYLENOL) 500 MG tablet Take 1,000 mg by mouth every 6 (six) hours as needed for mild pain.   . cefUROXime (CEFTIN) 250 MG tablet Take 250 mg by mouth 2 (two) times daily. 05/02/2018: Last dose today  . cholecalciferol (VITAMIN D) 1000 units tablet Take 2 tablets by mouth daily.   Marland Kitchen donepezil (ARICEPT) 10 MG tablet Take 10 mg by mouth at bedtime.    . lamoTRIgine (LAMICTAL) 25 MG tablet Take 25 mg by mouth 2 (two) times daily.    Marland Kitchen levETIRAcetam (KEPPRA) 750 MG tablet Take 750 mg by mouth 2 (two) times daily.   Marland Kitchen loratadine (CLARITIN) 10 MG tablet Take 10 mg by mouth daily as needed for allergies.    Marland Kitchen lovastatin (MEVACOR) 40 MG tablet Take 40 mg by mouth at bedtime.   . memantine (NAMENDA) 5 MG tablet Take 5  mg by mouth 2 (two) times daily.   . metoprolol tartrate (LOPRESSOR) 25 MG tablet Take 12.5 mg by mouth 2 (two) times daily.   . Polyethylene Glycol 3350 (PEG 3350) POWD Take 17 g by mouth daily. 05/02/2018: Patient taking as needed for constipation  . QUEtiapine (SEROQUEL) 50 MG tablet Take 50 mg by mouth at bedtime.   . senna-docusate (SENOKOT-S) 8.6-50 MG tablet Take 1  tablet by mouth daily.   Marland Kitchen venlafaxine XR (EFFEXOR-XR) 37.5 MG 24 hr capsule Take 37.5 mg by mouth daily. 05/02/2018: New instructions per SNF is QOD  . vitamin B-12 (CYANOCOBALAMIN) 1000 MCG tablet Take 1,000 mcg by mouth daily.   . cetirizine (ZYRTEC) 10 MG tablet Take 10 mg by mouth daily as needed for allergies.    . Cholecalciferol (VITAMIN D) 2000 units CAPS Take 2,000 Units by mouth daily.    . colchicine 0.6 MG tablet Take 0.6 mg by mouth as needed (Gout).    . traMADol (ULTRAM) 50 MG tablet Take 25 mg by mouth every 4 (four) hours as needed.    No facility-administered encounter medications on file as of 05/02/2018.     Functional Status:   In your present state of health, do you have any difficulty performing the following activities: 05/02/2018 03/25/2018  Hearing? N N  Vision? N N  Difficulty concentrating or making decisions? Tempie Donning  Walking or climbing stairs? Y Y  Dressing or bathing? Y Y  Doing errands, shopping? Tempie Donning  Preparing Food and eating ? Y -  Using the Toilet? Y -  In the past six months, have you accidently leaked urine? Y -  Do you have problems with loss of bowel control? Y -  Managing your Medications? Y -  Managing your Finances? Y -  Housekeeping or managing your Housekeeping? Y -  Some recent data might be hidden    Fall/Depression Screening:    Fall Risk  05/02/2018  Falls in the past year? Yes  Number falls in past yr: 2 or more  Injury with Fall? Yes  Comment "had brain bleed"  Risk Factor Category  High Fall Risk  Comment patient is now non ambulatory and is total life  Follow up Falls evaluation completed   PHQ 2/9 Scores 05/02/2018 04/25/2018 04/21/2018  PHQ - 2 Score - 2 1  Exception Documentation Patient refusal - -    Assessment: Patient intermittently alert when engaged in conversation. Some questions answered "yes" "no", and others not answered at all. RN CM completed initial assessment in conjunction with Shelley Downs, RN Clinical Manager with  [facility] [place]. HH RN attempting to obtain blood sample for CBC. Initial unsuccessful attempt on Wednesday. Today's attempt also unsuccessful. HH RN will notify PCP.  HH RN discussed with daughter her desires to have in-home physician as her primary care. Discussed discontinuation of specialist appointments. HH RN will facilitate. HH RN discussed with daughter removal of additional furniture from patients room and daughter agreed. HH RN feels hoyer lift will "fit in room" once furniture is removed and has placed new order. Daughter denies needs for additional [facility] community CM services and able to present [facility] RN CM folders from [facility] and [facility] RN CM verified contact information included in each folder.   Plan: Follow up phone call in 2 weeks to make sure [facility] lift and in-home PCP in place. RN CM will close case at that time as no other needs/education  Identified upon today's assessment. Care plan with long/short-term  goals not completed related to no needs identified and anticipation of case closure.  Sanyah Molnar E. Rollene Rotunda RN, BSN [facility] Care Management Coordinator [phone]

## 2018-05-05 DIAGNOSIS — E119 Type 2 diabetes mellitus without complications: Secondary | ICD-10-CM | POA: Diagnosis not present

## 2018-05-05 DIAGNOSIS — F0390 Unspecified dementia without behavioral disturbance: Secondary | ICD-10-CM | POA: Diagnosis not present

## 2018-05-05 DIAGNOSIS — F419 Anxiety disorder, unspecified: Secondary | ICD-10-CM | POA: Diagnosis not present

## 2018-05-05 DIAGNOSIS — I82493 Acute embolism and thrombosis of other specified deep vein of lower extremity, bilateral: Secondary | ICD-10-CM | POA: Diagnosis not present

## 2018-05-05 DIAGNOSIS — G40401 Other generalized epilepsy and epileptic syndromes, not intractable, with status epilepticus: Secondary | ICD-10-CM | POA: Diagnosis not present

## 2018-05-05 DIAGNOSIS — F0391 Unspecified dementia with behavioral disturbance: Secondary | ICD-10-CM | POA: Diagnosis not present

## 2018-05-05 DIAGNOSIS — M6281 Muscle weakness (generalized): Secondary | ICD-10-CM | POA: Diagnosis not present

## 2018-05-05 DIAGNOSIS — I824Y2 Acute embolism and thrombosis of unspecified deep veins of left proximal lower extremity: Secondary | ICD-10-CM | POA: Diagnosis not present

## 2018-05-05 DIAGNOSIS — G40909 Epilepsy, unspecified, not intractable, without status epilepticus: Secondary | ICD-10-CM | POA: Diagnosis not present

## 2018-05-05 DIAGNOSIS — Z0000 Encounter for general adult medical examination without abnormal findings: Secondary | ICD-10-CM | POA: Diagnosis not present

## 2018-05-05 DIAGNOSIS — I10 Essential (primary) hypertension: Secondary | ICD-10-CM | POA: Diagnosis not present

## 2018-05-07 DIAGNOSIS — I110 Hypertensive heart disease with heart failure: Secondary | ICD-10-CM | POA: Diagnosis not present

## 2018-05-07 DIAGNOSIS — R2689 Other abnormalities of gait and mobility: Secondary | ICD-10-CM | POA: Diagnosis not present

## 2018-05-07 DIAGNOSIS — I972 Postmastectomy lymphedema syndrome: Secondary | ICD-10-CM | POA: Diagnosis not present

## 2018-05-07 DIAGNOSIS — F0391 Unspecified dementia with behavioral disturbance: Secondary | ICD-10-CM | POA: Diagnosis not present

## 2018-05-07 DIAGNOSIS — N390 Urinary tract infection, site not specified: Secondary | ICD-10-CM | POA: Diagnosis not present

## 2018-05-07 DIAGNOSIS — I5020 Unspecified systolic (congestive) heart failure: Secondary | ICD-10-CM | POA: Diagnosis not present

## 2018-05-07 DIAGNOSIS — D649 Anemia, unspecified: Secondary | ICD-10-CM | POA: Diagnosis not present

## 2018-05-07 DIAGNOSIS — G40909 Epilepsy, unspecified, not intractable, without status epilepticus: Secondary | ICD-10-CM | POA: Diagnosis not present

## 2018-05-07 DIAGNOSIS — E119 Type 2 diabetes mellitus without complications: Secondary | ICD-10-CM | POA: Diagnosis not present

## 2018-05-13 DIAGNOSIS — E119 Type 2 diabetes mellitus without complications: Secondary | ICD-10-CM | POA: Diagnosis not present

## 2018-05-13 DIAGNOSIS — I5020 Unspecified systolic (congestive) heart failure: Secondary | ICD-10-CM | POA: Diagnosis not present

## 2018-05-13 DIAGNOSIS — I110 Hypertensive heart disease with heart failure: Secondary | ICD-10-CM | POA: Diagnosis not present

## 2018-05-13 DIAGNOSIS — D649 Anemia, unspecified: Secondary | ICD-10-CM | POA: Diagnosis not present

## 2018-05-13 DIAGNOSIS — I972 Postmastectomy lymphedema syndrome: Secondary | ICD-10-CM | POA: Diagnosis not present

## 2018-05-13 DIAGNOSIS — Z7901 Long term (current) use of anticoagulants: Secondary | ICD-10-CM | POA: Diagnosis not present

## 2018-05-13 DIAGNOSIS — G40909 Epilepsy, unspecified, not intractable, without status epilepticus: Secondary | ICD-10-CM | POA: Diagnosis not present

## 2018-05-13 DIAGNOSIS — F0391 Unspecified dementia with behavioral disturbance: Secondary | ICD-10-CM | POA: Diagnosis not present

## 2018-05-13 DIAGNOSIS — R2689 Other abnormalities of gait and mobility: Secondary | ICD-10-CM | POA: Diagnosis not present

## 2018-05-13 DIAGNOSIS — N390 Urinary tract infection, site not specified: Secondary | ICD-10-CM | POA: Diagnosis not present

## 2018-05-13 DIAGNOSIS — R6889 Other general symptoms and signs: Secondary | ICD-10-CM | POA: Diagnosis not present

## 2018-05-15 DIAGNOSIS — G40909 Epilepsy, unspecified, not intractable, without status epilepticus: Secondary | ICD-10-CM | POA: Diagnosis not present

## 2018-05-15 DIAGNOSIS — F0391 Unspecified dementia with behavioral disturbance: Secondary | ICD-10-CM | POA: Diagnosis not present

## 2018-05-15 DIAGNOSIS — I110 Hypertensive heart disease with heart failure: Secondary | ICD-10-CM | POA: Diagnosis not present

## 2018-05-15 DIAGNOSIS — D649 Anemia, unspecified: Secondary | ICD-10-CM | POA: Diagnosis not present

## 2018-05-15 DIAGNOSIS — I972 Postmastectomy lymphedema syndrome: Secondary | ICD-10-CM | POA: Diagnosis not present

## 2018-05-15 DIAGNOSIS — E119 Type 2 diabetes mellitus without complications: Secondary | ICD-10-CM | POA: Diagnosis not present

## 2018-05-15 DIAGNOSIS — N390 Urinary tract infection, site not specified: Secondary | ICD-10-CM | POA: Diagnosis not present

## 2018-05-15 DIAGNOSIS — R2689 Other abnormalities of gait and mobility: Secondary | ICD-10-CM | POA: Diagnosis not present

## 2018-05-15 DIAGNOSIS — I5020 Unspecified systolic (congestive) heart failure: Secondary | ICD-10-CM | POA: Diagnosis not present

## 2018-05-18 DIAGNOSIS — F0390 Unspecified dementia without behavioral disturbance: Secondary | ICD-10-CM | POA: Diagnosis not present

## 2018-05-18 DIAGNOSIS — G40401 Other generalized epilepsy and epileptic syndromes, not intractable, with status epilepticus: Secondary | ICD-10-CM | POA: Diagnosis not present

## 2018-05-18 DIAGNOSIS — M6281 Muscle weakness (generalized): Secondary | ICD-10-CM | POA: Diagnosis not present

## 2018-05-18 DIAGNOSIS — I82493 Acute embolism and thrombosis of other specified deep vein of lower extremity, bilateral: Secondary | ICD-10-CM | POA: Diagnosis not present

## 2018-05-19 ENCOUNTER — Other Ambulatory Visit: Payer: Self-pay

## 2018-05-19 DIAGNOSIS — I110 Hypertensive heart disease with heart failure: Secondary | ICD-10-CM | POA: Diagnosis not present

## 2018-05-19 DIAGNOSIS — R2689 Other abnormalities of gait and mobility: Secondary | ICD-10-CM | POA: Diagnosis not present

## 2018-05-19 DIAGNOSIS — D649 Anemia, unspecified: Secondary | ICD-10-CM | POA: Diagnosis not present

## 2018-05-19 DIAGNOSIS — F0391 Unspecified dementia with behavioral disturbance: Secondary | ICD-10-CM | POA: Diagnosis not present

## 2018-05-19 DIAGNOSIS — E119 Type 2 diabetes mellitus without complications: Secondary | ICD-10-CM | POA: Diagnosis not present

## 2018-05-19 DIAGNOSIS — N390 Urinary tract infection, site not specified: Secondary | ICD-10-CM | POA: Diagnosis not present

## 2018-05-19 DIAGNOSIS — G40909 Epilepsy, unspecified, not intractable, without status epilepticus: Secondary | ICD-10-CM | POA: Diagnosis not present

## 2018-05-19 DIAGNOSIS — I5020 Unspecified systolic (congestive) heart failure: Secondary | ICD-10-CM | POA: Diagnosis not present

## 2018-05-19 DIAGNOSIS — I972 Postmastectomy lymphedema syndrome: Secondary | ICD-10-CM | POA: Diagnosis not present

## 2018-05-19 NOTE — Patient Outreach (Signed)
[facility]) Care Management  05/19/2018  Shelley Cameron 09-05-1929 [id number]  Telephone Assessment/Case Closure: Successful telephone encounter to [name], daughter and HCPOA to the above patient for 2 week follow-up post initial home visit. [name] states patient is doing "about the same". States patient has been denied Medicaid after she had provided additional information needed. [facility] services remain in place with RN assessments twice weekly. Patient has been established with [employer] as her PCP, initial visit complete, and they will see patient in her home every 28 days and PRN. [name] also states [facility] RN discussed Hospice referral last week and encouraged daughter to "think about it" over the weekend. Daughter states by having hospice in the home it would help with Aid services and equipment. Daughter is leaning towards accepting hospice and palliative care referral.  RN CM again discussed with daughter services offered by [facility] CM as duplicate for what she is already receiving and daughter states "I already feel fully supported except for being denied Medicaid". Also discussed patient now receiving PCP services from non-[facility] provider. Daughter very appreciative of [facility] support and assessment and aware of case closure.  Plan: Case Closure  Nazeer Romney E. Rollene Rotunda RN, BSN [facility] Care Management Coordinator [phone]

## 2018-05-22 DIAGNOSIS — I110 Hypertensive heart disease with heart failure: Secondary | ICD-10-CM | POA: Diagnosis not present

## 2018-05-22 DIAGNOSIS — I972 Postmastectomy lymphedema syndrome: Secondary | ICD-10-CM | POA: Diagnosis not present

## 2018-05-22 DIAGNOSIS — D649 Anemia, unspecified: Secondary | ICD-10-CM | POA: Diagnosis not present

## 2018-05-22 DIAGNOSIS — F0391 Unspecified dementia with behavioral disturbance: Secondary | ICD-10-CM | POA: Diagnosis not present

## 2018-05-22 DIAGNOSIS — I5020 Unspecified systolic (congestive) heart failure: Secondary | ICD-10-CM | POA: Diagnosis not present

## 2018-05-22 DIAGNOSIS — E119 Type 2 diabetes mellitus without complications: Secondary | ICD-10-CM | POA: Diagnosis not present

## 2018-05-22 DIAGNOSIS — N390 Urinary tract infection, site not specified: Secondary | ICD-10-CM | POA: Diagnosis not present

## 2018-05-22 DIAGNOSIS — G40909 Epilepsy, unspecified, not intractable, without status epilepticus: Secondary | ICD-10-CM | POA: Diagnosis not present

## 2018-05-22 DIAGNOSIS — R2689 Other abnormalities of gait and mobility: Secondary | ICD-10-CM | POA: Diagnosis not present

## 2018-05-26 DIAGNOSIS — R2689 Other abnormalities of gait and mobility: Secondary | ICD-10-CM | POA: Diagnosis not present

## 2018-05-26 DIAGNOSIS — D649 Anemia, unspecified: Secondary | ICD-10-CM | POA: Diagnosis not present

## 2018-05-26 DIAGNOSIS — I5020 Unspecified systolic (congestive) heart failure: Secondary | ICD-10-CM | POA: Diagnosis not present

## 2018-05-26 DIAGNOSIS — G40909 Epilepsy, unspecified, not intractable, without status epilepticus: Secondary | ICD-10-CM | POA: Diagnosis not present

## 2018-05-26 DIAGNOSIS — I972 Postmastectomy lymphedema syndrome: Secondary | ICD-10-CM | POA: Diagnosis not present

## 2018-05-26 DIAGNOSIS — F0391 Unspecified dementia with behavioral disturbance: Secondary | ICD-10-CM | POA: Diagnosis not present

## 2018-05-26 DIAGNOSIS — E119 Type 2 diabetes mellitus without complications: Secondary | ICD-10-CM | POA: Diagnosis not present

## 2018-05-26 DIAGNOSIS — I110 Hypertensive heart disease with heart failure: Secondary | ICD-10-CM | POA: Diagnosis not present

## 2018-05-26 DIAGNOSIS — N390 Urinary tract infection, site not specified: Secondary | ICD-10-CM | POA: Diagnosis not present

## 2018-05-27 DIAGNOSIS — E119 Type 2 diabetes mellitus without complications: Secondary | ICD-10-CM | POA: Diagnosis not present

## 2018-05-27 DIAGNOSIS — D649 Anemia, unspecified: Secondary | ICD-10-CM | POA: Diagnosis not present

## 2018-05-27 DIAGNOSIS — F0391 Unspecified dementia with behavioral disturbance: Secondary | ICD-10-CM | POA: Diagnosis not present

## 2018-05-27 DIAGNOSIS — N390 Urinary tract infection, site not specified: Secondary | ICD-10-CM | POA: Diagnosis not present

## 2018-05-27 DIAGNOSIS — R2689 Other abnormalities of gait and mobility: Secondary | ICD-10-CM | POA: Diagnosis not present

## 2018-05-27 DIAGNOSIS — I110 Hypertensive heart disease with heart failure: Secondary | ICD-10-CM | POA: Diagnosis not present

## 2018-05-27 DIAGNOSIS — G40909 Epilepsy, unspecified, not intractable, without status epilepticus: Secondary | ICD-10-CM | POA: Diagnosis not present

## 2018-05-27 DIAGNOSIS — I5020 Unspecified systolic (congestive) heart failure: Secondary | ICD-10-CM | POA: Diagnosis not present

## 2018-05-27 DIAGNOSIS — I972 Postmastectomy lymphedema syndrome: Secondary | ICD-10-CM | POA: Diagnosis not present

## 2018-05-29 DIAGNOSIS — E119 Type 2 diabetes mellitus without complications: Secondary | ICD-10-CM | POA: Diagnosis not present

## 2018-05-29 DIAGNOSIS — D649 Anemia, unspecified: Secondary | ICD-10-CM | POA: Diagnosis not present

## 2018-05-29 DIAGNOSIS — I972 Postmastectomy lymphedema syndrome: Secondary | ICD-10-CM | POA: Diagnosis not present

## 2018-05-29 DIAGNOSIS — F0391 Unspecified dementia with behavioral disturbance: Secondary | ICD-10-CM | POA: Diagnosis not present

## 2018-05-29 DIAGNOSIS — I5020 Unspecified systolic (congestive) heart failure: Secondary | ICD-10-CM | POA: Diagnosis not present

## 2018-05-29 DIAGNOSIS — G40909 Epilepsy, unspecified, not intractable, without status epilepticus: Secondary | ICD-10-CM | POA: Diagnosis not present

## 2018-05-29 DIAGNOSIS — G40401 Other generalized epilepsy and epileptic syndromes, not intractable, with status epilepticus: Secondary | ICD-10-CM | POA: Diagnosis not present

## 2018-05-29 DIAGNOSIS — N390 Urinary tract infection, site not specified: Secondary | ICD-10-CM | POA: Diagnosis not present

## 2018-05-29 DIAGNOSIS — F0390 Unspecified dementia without behavioral disturbance: Secondary | ICD-10-CM | POA: Diagnosis not present

## 2018-05-29 DIAGNOSIS — M6281 Muscle weakness (generalized): Secondary | ICD-10-CM | POA: Diagnosis not present

## 2018-05-29 DIAGNOSIS — R2689 Other abnormalities of gait and mobility: Secondary | ICD-10-CM | POA: Diagnosis not present

## 2018-05-29 DIAGNOSIS — I110 Hypertensive heart disease with heart failure: Secondary | ICD-10-CM | POA: Diagnosis not present

## 2018-05-29 DIAGNOSIS — I82493 Acute embolism and thrombosis of other specified deep vein of lower extremity, bilateral: Secondary | ICD-10-CM | POA: Diagnosis not present

## 2018-05-29 DIAGNOSIS — I824Y2 Acute embolism and thrombosis of unspecified deep veins of left proximal lower extremity: Secondary | ICD-10-CM | POA: Diagnosis not present

## 2018-06-02 DIAGNOSIS — I5020 Unspecified systolic (congestive) heart failure: Secondary | ICD-10-CM | POA: Diagnosis not present

## 2018-06-02 DIAGNOSIS — I972 Postmastectomy lymphedema syndrome: Secondary | ICD-10-CM | POA: Diagnosis not present

## 2018-06-02 DIAGNOSIS — G40909 Epilepsy, unspecified, not intractable, without status epilepticus: Secondary | ICD-10-CM | POA: Diagnosis not present

## 2018-06-02 DIAGNOSIS — N390 Urinary tract infection, site not specified: Secondary | ICD-10-CM | POA: Diagnosis not present

## 2018-06-02 DIAGNOSIS — E119 Type 2 diabetes mellitus without complications: Secondary | ICD-10-CM | POA: Diagnosis not present

## 2018-06-02 DIAGNOSIS — R2689 Other abnormalities of gait and mobility: Secondary | ICD-10-CM | POA: Diagnosis not present

## 2018-06-02 DIAGNOSIS — I110 Hypertensive heart disease with heart failure: Secondary | ICD-10-CM | POA: Diagnosis not present

## 2018-06-02 DIAGNOSIS — F0391 Unspecified dementia with behavioral disturbance: Secondary | ICD-10-CM | POA: Diagnosis not present

## 2018-06-02 DIAGNOSIS — D649 Anemia, unspecified: Secondary | ICD-10-CM | POA: Diagnosis not present

## 2018-06-03 DIAGNOSIS — I5020 Unspecified systolic (congestive) heart failure: Secondary | ICD-10-CM | POA: Diagnosis not present

## 2018-06-03 DIAGNOSIS — G40909 Epilepsy, unspecified, not intractable, without status epilepticus: Secondary | ICD-10-CM | POA: Diagnosis not present

## 2018-06-03 DIAGNOSIS — F0390 Unspecified dementia without behavioral disturbance: Secondary | ICD-10-CM | POA: Diagnosis not present

## 2018-06-03 DIAGNOSIS — D649 Anemia, unspecified: Secondary | ICD-10-CM | POA: Diagnosis not present

## 2018-06-03 DIAGNOSIS — E119 Type 2 diabetes mellitus without complications: Secondary | ICD-10-CM | POA: Diagnosis not present

## 2018-06-03 DIAGNOSIS — F419 Anxiety disorder, unspecified: Secondary | ICD-10-CM | POA: Diagnosis not present

## 2018-06-03 DIAGNOSIS — N390 Urinary tract infection, site not specified: Secondary | ICD-10-CM | POA: Diagnosis not present

## 2018-06-03 DIAGNOSIS — I10 Essential (primary) hypertension: Secondary | ICD-10-CM | POA: Diagnosis not present

## 2018-06-03 DIAGNOSIS — I110 Hypertensive heart disease with heart failure: Secondary | ICD-10-CM | POA: Diagnosis not present

## 2018-06-03 DIAGNOSIS — F0391 Unspecified dementia with behavioral disturbance: Secondary | ICD-10-CM | POA: Diagnosis not present

## 2018-06-03 DIAGNOSIS — I972 Postmastectomy lymphedema syndrome: Secondary | ICD-10-CM | POA: Diagnosis not present

## 2018-06-03 DIAGNOSIS — R2689 Other abnormalities of gait and mobility: Secondary | ICD-10-CM | POA: Diagnosis not present

## 2018-06-04 DIAGNOSIS — E119 Type 2 diabetes mellitus without complications: Secondary | ICD-10-CM | POA: Diagnosis not present

## 2018-06-04 DIAGNOSIS — F0391 Unspecified dementia with behavioral disturbance: Secondary | ICD-10-CM | POA: Diagnosis not present

## 2018-06-04 DIAGNOSIS — R2689 Other abnormalities of gait and mobility: Secondary | ICD-10-CM | POA: Diagnosis not present

## 2018-06-04 DIAGNOSIS — D649 Anemia, unspecified: Secondary | ICD-10-CM | POA: Diagnosis not present

## 2018-06-04 DIAGNOSIS — I824Y2 Acute embolism and thrombosis of unspecified deep veins of left proximal lower extremity: Secondary | ICD-10-CM | POA: Diagnosis not present

## 2018-06-04 DIAGNOSIS — F0390 Unspecified dementia without behavioral disturbance: Secondary | ICD-10-CM | POA: Diagnosis not present

## 2018-06-04 DIAGNOSIS — I110 Hypertensive heart disease with heart failure: Secondary | ICD-10-CM | POA: Diagnosis not present

## 2018-06-04 DIAGNOSIS — M6281 Muscle weakness (generalized): Secondary | ICD-10-CM | POA: Diagnosis not present

## 2018-06-04 DIAGNOSIS — G40401 Other generalized epilepsy and epileptic syndromes, not intractable, with status epilepticus: Secondary | ICD-10-CM | POA: Diagnosis not present

## 2018-06-04 DIAGNOSIS — N390 Urinary tract infection, site not specified: Secondary | ICD-10-CM | POA: Diagnosis not present

## 2018-06-04 DIAGNOSIS — G40909 Epilepsy, unspecified, not intractable, without status epilepticus: Secondary | ICD-10-CM | POA: Diagnosis not present

## 2018-06-04 DIAGNOSIS — I5020 Unspecified systolic (congestive) heart failure: Secondary | ICD-10-CM | POA: Diagnosis not present

## 2018-06-04 DIAGNOSIS — I972 Postmastectomy lymphedema syndrome: Secondary | ICD-10-CM | POA: Diagnosis not present

## 2018-06-04 DIAGNOSIS — I82493 Acute embolism and thrombosis of other specified deep vein of lower extremity, bilateral: Secondary | ICD-10-CM | POA: Diagnosis not present

## 2018-06-05 DIAGNOSIS — F0391 Unspecified dementia with behavioral disturbance: Secondary | ICD-10-CM | POA: Diagnosis not present

## 2018-06-05 DIAGNOSIS — I5020 Unspecified systolic (congestive) heart failure: Secondary | ICD-10-CM | POA: Diagnosis not present

## 2018-06-05 DIAGNOSIS — R2689 Other abnormalities of gait and mobility: Secondary | ICD-10-CM | POA: Diagnosis not present

## 2018-06-05 DIAGNOSIS — E119 Type 2 diabetes mellitus without complications: Secondary | ICD-10-CM | POA: Diagnosis not present

## 2018-06-05 DIAGNOSIS — I110 Hypertensive heart disease with heart failure: Secondary | ICD-10-CM | POA: Diagnosis not present

## 2018-06-05 DIAGNOSIS — I972 Postmastectomy lymphedema syndrome: Secondary | ICD-10-CM | POA: Diagnosis not present

## 2018-06-05 DIAGNOSIS — G40909 Epilepsy, unspecified, not intractable, without status epilepticus: Secondary | ICD-10-CM | POA: Diagnosis not present

## 2018-06-05 DIAGNOSIS — N390 Urinary tract infection, site not specified: Secondary | ICD-10-CM | POA: Diagnosis not present

## 2018-06-05 DIAGNOSIS — D649 Anemia, unspecified: Secondary | ICD-10-CM | POA: Diagnosis not present

## 2018-06-09 DIAGNOSIS — I5020 Unspecified systolic (congestive) heart failure: Secondary | ICD-10-CM | POA: Diagnosis not present

## 2018-06-09 DIAGNOSIS — I972 Postmastectomy lymphedema syndrome: Secondary | ICD-10-CM | POA: Diagnosis not present

## 2018-06-09 DIAGNOSIS — R2689 Other abnormalities of gait and mobility: Secondary | ICD-10-CM | POA: Diagnosis not present

## 2018-06-09 DIAGNOSIS — N390 Urinary tract infection, site not specified: Secondary | ICD-10-CM | POA: Diagnosis not present

## 2018-06-09 DIAGNOSIS — E119 Type 2 diabetes mellitus without complications: Secondary | ICD-10-CM | POA: Diagnosis not present

## 2018-06-09 DIAGNOSIS — G40909 Epilepsy, unspecified, not intractable, without status epilepticus: Secondary | ICD-10-CM | POA: Diagnosis not present

## 2018-06-09 DIAGNOSIS — D649 Anemia, unspecified: Secondary | ICD-10-CM | POA: Diagnosis not present

## 2018-06-09 DIAGNOSIS — I110 Hypertensive heart disease with heart failure: Secondary | ICD-10-CM | POA: Diagnosis not present

## 2018-06-09 DIAGNOSIS — F0391 Unspecified dementia with behavioral disturbance: Secondary | ICD-10-CM | POA: Diagnosis not present

## 2018-06-12 DIAGNOSIS — R2689 Other abnormalities of gait and mobility: Secondary | ICD-10-CM | POA: Diagnosis not present

## 2018-06-12 DIAGNOSIS — F0391 Unspecified dementia with behavioral disturbance: Secondary | ICD-10-CM | POA: Diagnosis not present

## 2018-06-12 DIAGNOSIS — I972 Postmastectomy lymphedema syndrome: Secondary | ICD-10-CM | POA: Diagnosis not present

## 2018-06-12 DIAGNOSIS — I110 Hypertensive heart disease with heart failure: Secondary | ICD-10-CM | POA: Diagnosis not present

## 2018-06-12 DIAGNOSIS — N390 Urinary tract infection, site not specified: Secondary | ICD-10-CM | POA: Diagnosis not present

## 2018-06-12 DIAGNOSIS — G40909 Epilepsy, unspecified, not intractable, without status epilepticus: Secondary | ICD-10-CM | POA: Diagnosis not present

## 2018-06-12 DIAGNOSIS — D649 Anemia, unspecified: Secondary | ICD-10-CM | POA: Diagnosis not present

## 2018-06-12 DIAGNOSIS — I5020 Unspecified systolic (congestive) heart failure: Secondary | ICD-10-CM | POA: Diagnosis not present

## 2018-06-12 DIAGNOSIS — E119 Type 2 diabetes mellitus without complications: Secondary | ICD-10-CM | POA: Diagnosis not present

## 2018-06-15 ENCOUNTER — Emergency Department
Admission: EM | Admit: 2018-06-15 | Discharge: 2018-06-15 | Disposition: A | Discharge status: Home / Self Care | Payer: Medicare HMO | Attending: Emergency Medicine | Admitting: Emergency Medicine

## 2018-06-15 ENCOUNTER — Encounter: Payer: Self-pay | Admitting: Emergency Medicine

## 2018-06-15 ENCOUNTER — Other Ambulatory Visit: Payer: Self-pay

## 2018-06-15 DIAGNOSIS — R569 Unspecified convulsions: Secondary | ICD-10-CM | POA: Diagnosis not present

## 2018-06-15 DIAGNOSIS — R4182 Altered mental status, unspecified: Secondary | ICD-10-CM | POA: Diagnosis not present

## 2018-06-15 DIAGNOSIS — I5022 Chronic systolic (congestive) heart failure: Secondary | ICD-10-CM | POA: Diagnosis not present

## 2018-06-15 DIAGNOSIS — E119 Type 2 diabetes mellitus without complications: Secondary | ICD-10-CM | POA: Diagnosis not present

## 2018-06-15 DIAGNOSIS — R55 Syncope and collapse: Secondary | ICD-10-CM | POA: Insufficient documentation

## 2018-06-15 DIAGNOSIS — I4891 Unspecified atrial fibrillation: Secondary | ICD-10-CM | POA: Diagnosis not present

## 2018-06-15 DIAGNOSIS — Z79899 Other long term (current) drug therapy: Secondary | ICD-10-CM | POA: Diagnosis not present

## 2018-06-15 DIAGNOSIS — Z7901 Long term (current) use of anticoagulants: Secondary | ICD-10-CM | POA: Insufficient documentation

## 2018-06-15 DIAGNOSIS — F0390 Unspecified dementia without behavioral disturbance: Secondary | ICD-10-CM | POA: Insufficient documentation

## 2018-06-15 DIAGNOSIS — Z7401 Bed confinement status: Secondary | ICD-10-CM | POA: Diagnosis not present

## 2018-06-15 DIAGNOSIS — I110 Hypertensive heart disease with heart failure: Secondary | ICD-10-CM | POA: Insufficient documentation

## 2018-06-15 LAB — CBC WITH DIFFERENTIAL/PLATELET
ABS IMMATURE GRANULOCYTES: 0.07 10*3/uL (ref 0.00–0.07)
BASOS PCT: 1 %
Basophils Absolute: 0.1 10*3/uL (ref 0.0–0.1)
EOS PCT: 2 %
Eosinophils Absolute: 0.3 10*3/uL (ref 0.0–0.5)
HEMATOCRIT: 33.5 % — AB (ref 36.0–46.0)
Hemoglobin: 10.5 g/dL — ABNORMAL LOW (ref 12.0–15.0)
Immature Granulocytes: 1 %
LYMPHS ABS: 4.2 10*3/uL — AB (ref 0.7–4.0)
LYMPHS PCT: 36 %
MCH: 25.4 pg — ABNORMAL LOW (ref 26.0–34.0)
MCHC: 31.3 g/dL (ref 30.0–36.0)
MCV: 81.1 fL (ref 80.0–100.0)
MONO ABS: 1.2 10*3/uL — AB (ref 0.1–1.0)
MONOS PCT: 10 %
NEUTROS ABS: 6 10*3/uL (ref 1.7–7.7)
Neutrophils Relative %: 50 %
PLATELETS: 261 10*3/uL (ref 150–400)
RBC: 4.13 MIL/uL (ref 3.87–5.11)
RDW: 16.6 % — ABNORMAL HIGH (ref 11.5–15.5)
WBC: 11.9 10*3/uL — AB (ref 4.0–10.5)
nRBC: 0 % (ref 0.0–0.2)

## 2018-06-15 LAB — URINALYSIS, COMPLETE (UACMP) WITH MICROSCOPIC
Bilirubin Urine: NEGATIVE
GLUCOSE, UA: NEGATIVE mg/dL
Hgb urine dipstick: NEGATIVE
KETONES UR: NEGATIVE mg/dL
Leukocytes, UA: NEGATIVE
NITRITE: NEGATIVE
PH: 6 (ref 5.0–8.0)
Protein, ur: >300 mg/dL — AB
Specific Gravity, Urine: 1.02 (ref 1.005–1.030)

## 2018-06-15 LAB — COMPREHENSIVE METABOLIC PANEL
ALK PHOS: 99 U/L (ref 38–126)
ALT: 9 U/L (ref 0–44)
AST: 19 U/L (ref 15–41)
Albumin: 3 g/dL — ABNORMAL LOW (ref 3.5–5.0)
Anion gap: 13 (ref 5–15)
BILIRUBIN TOTAL: 0.4 mg/dL (ref 0.3–1.2)
BUN: 11 mg/dL (ref 8–23)
CALCIUM: 8.3 mg/dL — AB (ref 8.9–10.3)
CHLORIDE: 100 mmol/L (ref 98–111)
CO2: 25 mmol/L (ref 22–32)
CREATININE: 1.02 mg/dL — AB (ref 0.44–1.00)
GFR, EST AFRICAN AMERICAN: 56 mL/min — AB (ref >60)
GFR, EST NON AFRICAN AMERICAN: 49 mL/min — AB (ref >60)
Glucose, Bld: 110 mg/dL — ABNORMAL HIGH (ref 70–99)
Potassium: 3.1 mmol/L — ABNORMAL LOW (ref 3.5–5.1)
Sodium: 138 mmol/L (ref 135–145)
TOTAL PROTEIN: 7 g/dL (ref 6.5–8.1)

## 2018-06-15 LAB — TROPONIN I
Troponin I: 0.08 ng/mL (ref <0.03)
Troponin I: 0.08 ng/mL (ref <0.03)

## 2018-06-15 LAB — GLUCOSE, CAPILLARY: Glucose-Capillary: 112 mg/dL — ABNORMAL HIGH (ref 70–99)

## 2018-06-15 MED ORDER — SODIUM CHLORIDE 0.9 % IV BOLUS
1000.0000 mL | Freq: Once | INTRAVENOUS | Status: AC
  Administered 2018-06-15: 1000 mL via INTRAVENOUS

## 2018-06-15 MED ORDER — ACETAMINOPHEN 325 MG PO TABS
650.0000 mg | ORAL_TABLET | Freq: Once | ORAL | Status: AC
  Administered 2018-06-15: 650 mg via ORAL
  Filled 2018-06-15: qty 2

## 2018-06-15 MED ORDER — SODIUM CHLORIDE 0.9 % IV BOLUS
500.0000 mL | Freq: Once | INTRAVENOUS | Status: AC
  Administered 2018-06-15: 500 mL via INTRAVENOUS

## 2018-06-15 NOTE — ED Provider Notes (Signed)
[facility] Emergency Department Provider Note  ___________________________________________   First MD Initiated Contact with Patient 06/15/18 1527     (approximate)  I have reviewed the triage vital signs and the nursing notes.   HISTORY  Chief Complaint Seizures   HPI Shelley Cameron is a 84 y.o. female with history of atrial fibrillation on Eliquis as well as a seizure disorder who was presented to the emergency department after seizure.  EMS reports that the patient had generalized tonic-clonic activity for an unknown duration of time.  Was initially confused upon arrival and hypotensive.  However, has regained consciousness and appears to be close to her baseline which EMS reports his dementia and confusion.  Patient also vomited x1.   Past Medical History:  Diagnosis Date  . A-fib ([place])   . Anemia   . Arthritis   . Cancer ([place]) 1998, 1999   breast cancer. Bilateral mastectomy.   . Diabetes mellitus without complication ([place])   . DJD (degenerative joint disease)   . Gout   . Heart murmur   . Hyperlipidemia   . Hypertension   . Lymphedema of right arm   . Moderate dementia ([place])   . Seizures ([place])    Tonic-Clonic seizure activity  . Severe mitral insufficiency   . Stroke [facility]) 2011  . Syncope   . Systolic CHF ([place])   . Urinary incontinence     Patient Active Problem List   Diagnosis Date Noted  . DVT (deep vein thrombosis) in pregnancy 03/25/2018  . Closed head injury without concussion 02/24/2018  . Anemia, unspecified 02/23/2018  . Arthritis 02/23/2018  . Congestive heart failure with left ventricular systolic dysfunction ([place]) 02/23/2018  . Diabetes mellitus type 2, uncomplicated ([place]) 54/65/6812  . Gout 02/23/2018  . Cause of injury, fall 02/13/2018  . Cardiac syncope 11/26/2017  . Sick sinus syndrome ([place]) 08/01/2016  . Chest wall pain 05/30/2016  . B12 deficiency 04/25/2016  . Edema of right lower extremity 01/10/2016  .  Chronic a-fib 09/07/2014    Past Surgical History:  Procedure Laterality Date  . BREAST SURGERY Left 08/1997   Dr Smith/mastectomy  . CATARACT EXTRACTION  2000  . EYE SURGERY Bilateral    Cataract Extraction with IOL  . IVC FILTER INSERTION N/A 03/26/2018   Procedure: IVC FILTER INSERTION;  Surgeon: Katha Cabal, MD;  Location: [facility] CV LAB;  Service: Cardiovascular;  Laterality: N/A;  . MASTECTOMY Right 02/19/1997   Dr Bary Castilla  . MASTECTOMY Left 1999   Dr. Rochel Brome, [facility]  . PACEMAKER INSERTION Left 08/01/2016   Procedure: INSERTION PACEMAKER;  Surgeon: Isaias Cowman, MD;  Location: [facility] ORS;  Service: Cardiovascular;  Laterality: Left;    Prior to Admission medications   Medication Sig Start Date End Date Taking? Authorizing Provider  acetaminophen (TYLENOL) 500 MG tablet Take 1,000 mg by mouth every 6 (six) hours as needed for mild pain.    [provider]  cefUROXime (CEFTIN) 250 MG tablet Take 250 mg by mouth 2 (two) times daily. 04/24/18   [provider]  cetirizine (ZYRTEC) 10 MG tablet Take 10 mg by mouth daily as needed for allergies.     [provider]  cholecalciferol (VITAMIN D) 1000 units tablet Take 2 tablets by mouth daily.    [provider]  Cholecalciferol (VITAMIN D) 2000 units CAPS Take 2,000 Units by mouth daily.     [provider]  colchicine 0.6 MG tablet Take 0.6 mg by  mouth as needed (Gout).     [provider]  donepezil (ARICEPT) 10 MG tablet Take 10 mg by mouth at bedtime.     [provider]  lamoTRIgine (LAMICTAL) 25 MG tablet Take 25 mg by mouth 2 (two) times daily.  01/11/18   [provider]  levETIRAcetam (KEPPRA) 750 MG tablet Take 750 mg by mouth 2 (two) times daily. 03/12/18   [provider]  loratadine (CLARITIN) 10 MG tablet Take 10 mg by mouth daily as needed for allergies.     [provider]  lovastatin (MEVACOR) 40 MG tablet Take 40  mg by mouth at bedtime.    [provider]  memantine (NAMENDA) 5 MG tablet Take 5 mg by mouth 2 (two) times daily. 12/24/17   [provider]  metoprolol tartrate (LOPRESSOR) 25 MG tablet Take 12.5 mg by mouth 2 (two) times daily. 03/18/18   [provider]  Polyethylene Glycol 3350 (PEG 3350) POWD Take 17 g by mouth daily.    [provider]  QUEtiapine (SEROQUEL) 50 MG tablet Take 50 mg by mouth at bedtime. 10/30/17   [provider]  senna-docusate (SENOKOT-S) 8.6-50 MG tablet Take 1 tablet by mouth daily.    [provider]  traMADol (ULTRAM) 50 MG tablet Take 25 mg by mouth every 4 (four) hours as needed.    [provider]  venlafaxine XR (EFFEXOR-XR) 37.5 MG 24 hr capsule Take 37.5 mg by mouth daily. 12/31/17   [provider]  vitamin B-12 (CYANOCOBALAMIN) 1000 MCG tablet Take 1,000 mcg by mouth daily.    [provider]    Allergies Codeine  Family History  Problem Relation Age of Onset  . Hypertension Father     Social History Social History   Tobacco Use  . Smoking status: Never Smoker  . Smokeless tobacco: Never Used  Substance Use Topics  . Alcohol use: No  . Drug use: No    Review of Systems  History confounded by dementia.  ____________________________________________   PHYSICAL EXAM:  VITAL SIGNS: ED Triage Vitals [06/15/18 1528]  Enc Vitals Group     BP      Pulse      Resp      Temp      Temp src      SpO2      Weight 165 lb (74.8 kg)     Height 5\' 2"  (1.575 m)     Head Circumference      Peak Flow      Pain Score      Pain Loc      Pain Edu?      Excl. in [place]?     Constitutional: Alert and following simple commands such as to wiggle toes and grip with her upper extremities. Eyes: Conjunctivae are normal.  Head: Atraumatic. Nose: No congestion/rhinnorhea. Mouth/Throat: Mucous membranes are moist.  Neck: No stridor.   Cardiovascular: Normal rate, regular rhythm.  Grossly normal heart sounds.  Good peripheral circulation. Respiratory: Normal respiratory effort.  No retractions. Lungs CTAB. Gastrointestinal: Soft and nontender. No distention. No CVA tenderness. Musculoskeletal: No lower extremity tenderness nor edema.  No joint effusions. Neurologic:  No gross focal neurologic deficits are appreciated. Skin:  Skin is warm, dry and intact. No rash noted. Psychiatric: Mood and affect are normal. Speech and behavior are normal.  ____________________________________________   LABS (all labs ordered are listed, but only abnormal results are displayed)  Labs Reviewed  CBC WITH DIFFERENTIAL/PLATELET -  Abnormal; Notable for the following components:      Result Value   WBC 11.9 (*)    Hemoglobin 10.5 (*)    HCT 33.5 (*)    MCH 25.4 (*)    RDW 16.6 (*)    Lymphs Abs 4.2 (*)    Monocytes Absolute 1.2 (*)    All other components within normal limits  COMPREHENSIVE METABOLIC PANEL - Abnormal; Notable for the following components:   Potassium 3.1 (*)    Glucose, Bld 110 (*)    Creatinine, Ser 1.02 (*)    Calcium 8.3 (*)    Albumin 3.0 (*)    GFR calc non Af Amer 49 (*)    GFR calc Af Amer 56 (*)    All other components within normal limits  TROPONIN I - Abnormal; Notable for the following components:   Troponin I 0.08 (*)    All other components within normal limits  GLUCOSE, CAPILLARY - Abnormal; Notable for the following components:   Glucose-Capillary 112 (*)    All other components within normal limits  URINALYSIS, COMPLETE (UACMP) WITH MICROSCOPIC  TROPONIN I   ____________________________________________  EKG  ED ECG REPORT I, Doran Stabler, the attending physician, personally viewed and interpreted this ECG.   Date: 06/15/2018  EKG Time: 1541  Rate: 58  Rhythm: Atrial fibrillation with intermittent demand pacing  Axis: Normal  Intervals: Normal  ST&T Change: Biphasic T waves in V4 and V5.  No significant change from  previous. ____________________________________________  RADIOLOGY   ____________________________________________   PROCEDURES  Procedure(s) performed:   Procedures  Critical Care performed:   ____________________________________________   INITIAL IMPRESSION / ASSESSMENT AND PLAN / ED COURSE  Pertinent labs & imaging results that were available during my care of the patient were reviewed by me and considered in my medical decision making (see chart for details).  DDX: Seizure, postictal state, syncope, less light abnormality, urinary tract infection, medication noncompliance As part of my medical decision making, I reviewed the following data within the [place] Notes from prior ED visits  ----------------------------------------- 5:36 PM on 06/15/2018 -----------------------------------------  Family member now at bedside, patient's daughter.  Patient's daughter states that she left the patient on the commode this afternoon and that the patient to be taking an unusually long amount of time.  Says that she went back to check on the patient and she had her head down against her chest.  Eyes were staring blankly and somewhat rolled back.  No shaking or convulsive activity.  Patient had had a large bowel movement.  Also vomited.  ----------------------------------------- 6:24 PM on 06/15/2018 -----------------------------------------  Discussed interrogation with [employer] representative who states that the patient did not have any events over the past 2 days.  ----------------------------------------- 10:18 PM on 06/15/2018 -----------------------------------------  Patient remains her baseline mental status.  Troponin is stable.  Patient likely vasovagal episode as she likely syncopized while in the commode moving her bowels and urinating.  Reassuring lab work otherwise.  Appears to have a somewhat chronic elevated white blood cell count.  Recommended  follow-up with cardiology.  Patient's daughter says that the patient is working now with doctors making house calls but had previously been seeing Dr. Nehemiah Massed.  Family understand the treatment plan and willing to comply.  Patient will be discharged with EMS transport as the patient's daughter says that the patient has been recently bedbound.  Also states the patient is now off of Eliquis because of frequent falls. ____________________________________________  FINAL CLINICAL IMPRESSION(S) / ED DIAGNOSES  Syncope.  NEW MEDICATIONS STARTED DURING THIS VISIT:  New Prescriptions   No medications on file     Note:  This document was prepared using Dragon voice recognition software and may include unintentional dictation errors.     Orbie Pyo, MD 06/15/18 2219

## 2018-06-15 NOTE — ED Notes (Signed)
Cleaned pt and Placed external cath on pt

## 2018-06-15 NOTE — ED Triage Notes (Signed)
Ems called for seizure activity and hypotension. bp 60/palp on their arrival. Hx seizures, dm, dementia,

## 2018-06-16 DIAGNOSIS — N390 Urinary tract infection, site not specified: Secondary | ICD-10-CM | POA: Diagnosis not present

## 2018-06-16 DIAGNOSIS — I110 Hypertensive heart disease with heart failure: Secondary | ICD-10-CM | POA: Diagnosis not present

## 2018-06-16 DIAGNOSIS — R2689 Other abnormalities of gait and mobility: Secondary | ICD-10-CM | POA: Diagnosis not present

## 2018-06-16 DIAGNOSIS — F0391 Unspecified dementia with behavioral disturbance: Secondary | ICD-10-CM | POA: Diagnosis not present

## 2018-06-16 DIAGNOSIS — G40909 Epilepsy, unspecified, not intractable, without status epilepticus: Secondary | ICD-10-CM | POA: Diagnosis not present

## 2018-06-16 DIAGNOSIS — I5020 Unspecified systolic (congestive) heart failure: Secondary | ICD-10-CM | POA: Diagnosis not present

## 2018-06-16 DIAGNOSIS — D649 Anemia, unspecified: Secondary | ICD-10-CM | POA: Diagnosis not present

## 2018-06-16 DIAGNOSIS — E119 Type 2 diabetes mellitus without complications: Secondary | ICD-10-CM | POA: Diagnosis not present

## 2018-06-16 DIAGNOSIS — I972 Postmastectomy lymphedema syndrome: Secondary | ICD-10-CM | POA: Diagnosis not present

## 2018-06-17 DIAGNOSIS — I82493 Acute embolism and thrombosis of other specified deep vein of lower extremity, bilateral: Secondary | ICD-10-CM | POA: Diagnosis not present

## 2018-06-17 DIAGNOSIS — G40401 Other generalized epilepsy and epileptic syndromes, not intractable, with status epilepticus: Secondary | ICD-10-CM | POA: Diagnosis not present

## 2018-06-17 DIAGNOSIS — M6281 Muscle weakness (generalized): Secondary | ICD-10-CM | POA: Diagnosis not present

## 2018-06-17 DIAGNOSIS — F0390 Unspecified dementia without behavioral disturbance: Secondary | ICD-10-CM | POA: Diagnosis not present

## 2018-06-19 DIAGNOSIS — I110 Hypertensive heart disease with heart failure: Secondary | ICD-10-CM | POA: Diagnosis not present

## 2018-06-19 DIAGNOSIS — D649 Anemia, unspecified: Secondary | ICD-10-CM | POA: Diagnosis not present

## 2018-06-19 DIAGNOSIS — I10 Essential (primary) hypertension: Secondary | ICD-10-CM | POA: Diagnosis not present

## 2018-06-19 DIAGNOSIS — Z7901 Long term (current) use of anticoagulants: Secondary | ICD-10-CM | POA: Diagnosis not present

## 2018-06-19 DIAGNOSIS — G894 Chronic pain syndrome: Secondary | ICD-10-CM | POA: Diagnosis not present

## 2018-06-19 DIAGNOSIS — Z86718 Personal history of other venous thrombosis and embolism: Secondary | ICD-10-CM | POA: Diagnosis not present

## 2018-06-19 DIAGNOSIS — E119 Type 2 diabetes mellitus without complications: Secondary | ICD-10-CM | POA: Diagnosis not present

## 2018-06-19 DIAGNOSIS — I5020 Unspecified systolic (congestive) heart failure: Secondary | ICD-10-CM | POA: Diagnosis not present

## 2018-06-19 DIAGNOSIS — R2689 Other abnormalities of gait and mobility: Secondary | ICD-10-CM | POA: Diagnosis not present

## 2018-06-19 DIAGNOSIS — G40909 Epilepsy, unspecified, not intractable, without status epilepticus: Secondary | ICD-10-CM | POA: Diagnosis not present

## 2018-06-19 DIAGNOSIS — I4891 Unspecified atrial fibrillation: Secondary | ICD-10-CM | POA: Diagnosis not present

## 2018-06-19 DIAGNOSIS — N390 Urinary tract infection, site not specified: Secondary | ICD-10-CM | POA: Diagnosis not present

## 2018-06-19 DIAGNOSIS — I972 Postmastectomy lymphedema syndrome: Secondary | ICD-10-CM | POA: Diagnosis not present

## 2018-06-19 DIAGNOSIS — F0390 Unspecified dementia without behavioral disturbance: Secondary | ICD-10-CM | POA: Diagnosis not present

## 2018-06-19 DIAGNOSIS — G629 Polyneuropathy, unspecified: Secondary | ICD-10-CM | POA: Diagnosis not present

## 2018-06-19 DIAGNOSIS — F0391 Unspecified dementia with behavioral disturbance: Secondary | ICD-10-CM | POA: Diagnosis not present

## 2018-06-20 ENCOUNTER — Emergency Department
Admission: EM | Admit: 2018-06-20 | Discharge: 2018-06-20 | Disposition: A | Discharge status: Other Acute Inpatient Hospital | Payer: Medicare HMO | Attending: Emergency Medicine | Admitting: Emergency Medicine

## 2018-06-20 ENCOUNTER — Emergency Department: Payer: Medicare HMO

## 2018-06-20 ENCOUNTER — Inpatient Hospital Stay (HOSPITAL_COMMUNITY): Payer: Medicare HMO

## 2018-06-20 ENCOUNTER — Inpatient Hospital Stay (HOSPITAL_COMMUNITY)
Admission: AD | Admit: 2018-06-20 | Discharge: 2018-06-26 | DRG: 065 | Disposition: A | Discharge status: Home Health | Payer: Medicare HMO | Source: Other Acute Inpatient Hospital | Attending: Neurology | Admitting: Neurology

## 2018-06-20 DIAGNOSIS — N390 Urinary tract infection, site not specified: Secondary | ICD-10-CM | POA: Diagnosis present

## 2018-06-20 DIAGNOSIS — T45615A Adverse effect of thrombolytic drugs, initial encounter: Secondary | ICD-10-CM | POA: Diagnosis present

## 2018-06-20 DIAGNOSIS — E876 Hypokalemia: Secondary | ICD-10-CM

## 2018-06-20 DIAGNOSIS — E119 Type 2 diabetes mellitus without complications: Secondary | ICD-10-CM

## 2018-06-20 DIAGNOSIS — R202 Paresthesia of skin: Secondary | ICD-10-CM | POA: Diagnosis present

## 2018-06-20 DIAGNOSIS — Z8249 Family history of ischemic heart disease and other diseases of the circulatory system: Secondary | ICD-10-CM

## 2018-06-20 DIAGNOSIS — R569 Unspecified convulsions: Secondary | ICD-10-CM

## 2018-06-20 DIAGNOSIS — Z950 Presence of cardiac pacemaker: Secondary | ICD-10-CM

## 2018-06-20 DIAGNOSIS — I6523 Occlusion and stenosis of bilateral carotid arteries: Secondary | ICD-10-CM | POA: Diagnosis present

## 2018-06-20 DIAGNOSIS — G9389 Other specified disorders of brain: Secondary | ICD-10-CM | POA: Diagnosis not present

## 2018-06-20 DIAGNOSIS — F320 Major depressive disorder, single episode, mild: Secondary | ICD-10-CM

## 2018-06-20 DIAGNOSIS — F0150 Vascular dementia without behavioral disturbance: Secondary | ICD-10-CM

## 2018-06-20 DIAGNOSIS — I4819 Other persistent atrial fibrillation: Secondary | ICD-10-CM | POA: Diagnosis not present

## 2018-06-20 DIAGNOSIS — I130 Hypertensive heart and chronic kidney disease with heart failure and stage 1 through stage 4 chronic kidney disease, or unspecified chronic kidney disease: Secondary | ICD-10-CM | POA: Diagnosis not present

## 2018-06-20 DIAGNOSIS — Z79899 Other long term (current) drug therapy: Secondary | ICD-10-CM | POA: Diagnosis not present

## 2018-06-20 DIAGNOSIS — Z885 Allergy status to narcotic agent status: Secondary | ICD-10-CM

## 2018-06-20 DIAGNOSIS — I6350 Cerebral infarction due to unspecified occlusion or stenosis of unspecified cerebral artery: Secondary | ICD-10-CM | POA: Insufficient documentation

## 2018-06-20 DIAGNOSIS — E538 Deficiency of other specified B group vitamins: Secondary | ICD-10-CM

## 2018-06-20 DIAGNOSIS — I340 Nonrheumatic mitral (valve) insufficiency: Secondary | ICD-10-CM | POA: Diagnosis not present

## 2018-06-20 DIAGNOSIS — I482 Chronic atrial fibrillation, unspecified: Secondary | ICD-10-CM

## 2018-06-20 DIAGNOSIS — M2550 Pain in unspecified joint: Secondary | ICD-10-CM | POA: Diagnosis not present

## 2018-06-20 DIAGNOSIS — Z86711 Personal history of pulmonary embolism: Secondary | ICD-10-CM

## 2018-06-20 DIAGNOSIS — I495 Sick sinus syndrome: Secondary | ICD-10-CM | POA: Diagnosis not present

## 2018-06-20 DIAGNOSIS — R29810 Facial weakness: Secondary | ICD-10-CM | POA: Diagnosis present

## 2018-06-20 DIAGNOSIS — I351 Nonrheumatic aortic (valve) insufficiency: Secondary | ICD-10-CM | POA: Diagnosis not present

## 2018-06-20 DIAGNOSIS — Z9841 Cataract extraction status, right eye: Secondary | ICD-10-CM

## 2018-06-20 DIAGNOSIS — Z86718 Personal history of other venous thrombosis and embolism: Secondary | ICD-10-CM

## 2018-06-20 DIAGNOSIS — G40909 Epilepsy, unspecified, not intractable, without status epilepticus: Secondary | ICD-10-CM | POA: Diagnosis present

## 2018-06-20 DIAGNOSIS — Z23 Encounter for immunization: Secondary | ICD-10-CM

## 2018-06-20 DIAGNOSIS — N183 Chronic kidney disease, stage 3 (moderate): Secondary | ICD-10-CM | POA: Diagnosis present

## 2018-06-20 DIAGNOSIS — I10 Essential (primary) hypertension: Secondary | ICD-10-CM

## 2018-06-20 DIAGNOSIS — I639 Cerebral infarction, unspecified: Secondary | ICD-10-CM

## 2018-06-20 DIAGNOSIS — Z853 Personal history of malignant neoplasm of breast: Secondary | ICD-10-CM | POA: Diagnosis not present

## 2018-06-20 DIAGNOSIS — R471 Dysarthria and anarthria: Secondary | ICD-10-CM | POA: Diagnosis present

## 2018-06-20 DIAGNOSIS — R531 Weakness: Secondary | ICD-10-CM

## 2018-06-20 DIAGNOSIS — E1122 Type 2 diabetes mellitus with diabetic chronic kidney disease: Secondary | ICD-10-CM | POA: Diagnosis present

## 2018-06-20 DIAGNOSIS — G8194 Hemiplegia, unspecified affecting left nondominant side: Secondary | ICD-10-CM | POA: Diagnosis present

## 2018-06-20 DIAGNOSIS — D649 Anemia, unspecified: Secondary | ICD-10-CM | POA: Diagnosis present

## 2018-06-20 DIAGNOSIS — K4090 Unilateral inguinal hernia, without obstruction or gangrene, not specified as recurrent: Secondary | ICD-10-CM | POA: Diagnosis not present

## 2018-06-20 DIAGNOSIS — R05 Cough: Secondary | ICD-10-CM | POA: Diagnosis not present

## 2018-06-20 DIAGNOSIS — E785 Hyperlipidemia, unspecified: Secondary | ICD-10-CM

## 2018-06-20 DIAGNOSIS — F0390 Unspecified dementia without behavioral disturbance: Secondary | ICD-10-CM | POA: Diagnosis present

## 2018-06-20 DIAGNOSIS — I63411 Cerebral infarction due to embolism of right middle cerebral artery: Secondary | ICD-10-CM | POA: Diagnosis not present

## 2018-06-20 DIAGNOSIS — Z9013 Acquired absence of bilateral breasts and nipples: Secondary | ICD-10-CM

## 2018-06-20 DIAGNOSIS — L8990 Pressure ulcer of unspecified site, unspecified stage: Secondary | ICD-10-CM

## 2018-06-20 DIAGNOSIS — M6281 Muscle weakness (generalized): Secondary | ICD-10-CM | POA: Diagnosis not present

## 2018-06-20 DIAGNOSIS — B965 Pseudomonas (aeruginosa) (mallei) (pseudomallei) as the cause of diseases classified elsewhere: Secondary | ICD-10-CM | POA: Diagnosis not present

## 2018-06-20 DIAGNOSIS — I5020 Unspecified systolic (congestive) heart failure: Secondary | ICD-10-CM | POA: Diagnosis not present

## 2018-06-20 DIAGNOSIS — Z8679 Personal history of other diseases of the circulatory system: Secondary | ICD-10-CM

## 2018-06-20 DIAGNOSIS — R4189 Other symptoms and signs involving cognitive functions and awareness: Secondary | ICD-10-CM | POA: Diagnosis not present

## 2018-06-20 DIAGNOSIS — Z961 Presence of intraocular lens: Secondary | ICD-10-CM | POA: Diagnosis present

## 2018-06-20 DIAGNOSIS — I110 Hypertensive heart disease with heart failure: Secondary | ICD-10-CM | POA: Insufficient documentation

## 2018-06-20 DIAGNOSIS — I6389 Other cerebral infarction: Secondary | ICD-10-CM

## 2018-06-20 DIAGNOSIS — I6300 Cerebral infarction due to thrombosis of unspecified precerebral artery: Secondary | ICD-10-CM | POA: Diagnosis not present

## 2018-06-20 DIAGNOSIS — Z9842 Cataract extraction status, left eye: Secondary | ICD-10-CM

## 2018-06-20 DIAGNOSIS — Z8673 Personal history of transient ischemic attack (TIA), and cerebral infarction without residual deficits: Secondary | ICD-10-CM

## 2018-06-20 DIAGNOSIS — Z7401 Bed confinement status: Secondary | ICD-10-CM | POA: Diagnosis not present

## 2018-06-20 DIAGNOSIS — R791 Abnormal coagulation profile: Secondary | ICD-10-CM | POA: Diagnosis present

## 2018-06-20 DIAGNOSIS — R059 Cough, unspecified: Secondary | ICD-10-CM

## 2018-06-20 DIAGNOSIS — R29818 Other symptoms and signs involving the nervous system: Secondary | ICD-10-CM | POA: Diagnosis not present

## 2018-06-20 DIAGNOSIS — J45909 Unspecified asthma, uncomplicated: Secondary | ICD-10-CM

## 2018-06-20 DIAGNOSIS — T7840XD Allergy, unspecified, subsequent encounter: Secondary | ICD-10-CM

## 2018-06-20 DIAGNOSIS — I5022 Chronic systolic (congestive) heart failure: Secondary | ICD-10-CM | POA: Diagnosis not present

## 2018-06-20 DIAGNOSIS — I6340 Cerebral infarction due to embolism of unspecified cerebral artery: Secondary | ICD-10-CM | POA: Diagnosis not present

## 2018-06-20 DIAGNOSIS — R0602 Shortness of breath: Secondary | ICD-10-CM | POA: Diagnosis not present

## 2018-06-20 DIAGNOSIS — Z66 Do not resuscitate: Secondary | ICD-10-CM | POA: Diagnosis not present

## 2018-06-20 DIAGNOSIS — R29708 NIHSS score 8: Secondary | ICD-10-CM | POA: Diagnosis not present

## 2018-06-20 DIAGNOSIS — E559 Vitamin D deficiency, unspecified: Secondary | ICD-10-CM

## 2018-06-20 DIAGNOSIS — Z9181 History of falling: Secondary | ICD-10-CM

## 2018-06-20 DIAGNOSIS — Z9282 Status post administration of tPA (rtPA) in a different facility within the last 24 hours prior to admission to current facility: Secondary | ICD-10-CM

## 2018-06-20 LAB — COMPREHENSIVE METABOLIC PANEL
ALBUMIN: 3.3 g/dL — AB (ref 3.5–5.0)
ALT: 10 U/L (ref 0–44)
AST: 18 U/L (ref 15–41)
Alkaline Phosphatase: 111 U/L (ref 38–126)
Anion gap: 11 (ref 5–15)
BUN: 8 mg/dL (ref 8–23)
CALCIUM: 8.7 mg/dL — AB (ref 8.9–10.3)
CO2: 28 mmol/L (ref 22–32)
Chloride: 102 mmol/L (ref 98–111)
Creatinine, Ser: 0.93 mg/dL (ref 0.44–1.00)
GFR calc non Af Amer: 54 mL/min — ABNORMAL LOW (ref >60)
GLUCOSE: 89 mg/dL (ref 70–99)
POTASSIUM: 3.1 mmol/L — AB (ref 3.5–5.1)
SODIUM: 141 mmol/L (ref 135–145)
TOTAL PROTEIN: 7.6 g/dL (ref 6.5–8.1)
Total Bilirubin: 0.6 mg/dL (ref 0.3–1.2)

## 2018-06-20 LAB — ETHANOL

## 2018-06-20 LAB — PROTIME-INR
INR: 0.92
PROTHROMBIN TIME: 12.3 s (ref 11.4–15.2)

## 2018-06-20 LAB — CBC WITH DIFFERENTIAL/PLATELET
Abs Immature Granulocytes: 0.04 10*3/uL (ref 0.00–0.07)
BASOS ABS: 0 10*3/uL (ref 0.0–0.1)
BASOS PCT: 1 %
EOS ABS: 0.2 10*3/uL (ref 0.0–0.5)
EOS PCT: 3 %
HCT: 34.8 % — ABNORMAL LOW (ref 36.0–46.0)
Hemoglobin: 11.1 g/dL — ABNORMAL LOW (ref 12.0–15.0)
Immature Granulocytes: 1 %
Lymphocytes Relative: 23 %
Lymphs Abs: 1.8 10*3/uL (ref 0.7–4.0)
MCH: 25.6 pg — AB (ref 26.0–34.0)
MCHC: 31.9 g/dL (ref 30.0–36.0)
MCV: 80.4 fL (ref 80.0–100.0)
MONO ABS: 0.4 10*3/uL (ref 0.1–1.0)
Monocytes Relative: 5 %
NRBC: 0 % (ref 0.0–0.2)
Neutro Abs: 5.4 10*3/uL (ref 1.7–7.7)
Neutrophils Relative %: 67 %
Platelets: 265 10*3/uL (ref 150–400)
RBC: 4.33 MIL/uL (ref 3.87–5.11)
RDW: 17.1 % — AB (ref 11.5–15.5)
WBC: 7.9 10*3/uL (ref 4.0–10.5)

## 2018-06-20 LAB — MRSA PCR SCREENING: MRSA BY PCR: NEGATIVE

## 2018-06-20 LAB — GLUCOSE, CAPILLARY: Glucose-Capillary: 80 mg/dL (ref 70–99)

## 2018-06-20 LAB — APTT

## 2018-06-20 LAB — TROPONIN I: Troponin I: <0.03 ng/mL (ref <0.03)

## 2018-06-20 MED ORDER — LABETALOL HCL 5 MG/ML IV SOLN
20.0000 mg | Freq: Once | INTRAVENOUS | Status: AC
  Administered 2018-06-20: 20 mg via INTRAVENOUS
  Filled 2018-06-20: qty 4

## 2018-06-20 MED ORDER — CLEVIDIPINE BUTYRATE 0.5 MG/ML IV EMUL
0.0000 mg/h | INTRAVENOUS | Status: DC
  Administered 2018-06-21: 2 mg/h via INTRAVENOUS
  Filled 2018-06-20: qty 50

## 2018-06-20 MED ORDER — SENNOSIDES-DOCUSATE SODIUM 8.6-50 MG PO TABS
1.0000 | ORAL_TABLET | Freq: Every evening | ORAL | Status: DC | PRN
  Administered 2018-06-24: 1 via ORAL
  Filled 2018-06-20 (×2): qty 1

## 2018-06-20 MED ORDER — ORAL CARE MOUTH RINSE
15.0000 mL | Freq: Two times a day (BID) | OROMUCOSAL | Status: DC
  Administered 2018-06-22 – 2018-06-25 (×7): 15 mL via OROMUCOSAL

## 2018-06-20 MED ORDER — ALTEPLASE 100 MG IV SOLR
INTRAVENOUS | Status: AC
  Administered 2018-06-20: 67.3 mg via INTRAVENOUS
  Filled 2018-06-20: qty 100

## 2018-06-20 MED ORDER — CHLORHEXIDINE GLUCONATE 0.12 % MT SOLN
15.0000 mL | Freq: Two times a day (BID) | OROMUCOSAL | Status: DC
  Administered 2018-06-20 – 2018-06-26 (×12): 15 mL via OROMUCOSAL
  Filled 2018-06-20 (×9): qty 15

## 2018-06-20 MED ORDER — STROKE: EARLY STAGES OF RECOVERY BOOK
Freq: Once | Status: AC
  Administered 2018-06-20: 16:00:00
  Filled 2018-06-20: qty 1

## 2018-06-20 MED ORDER — ACETAMINOPHEN 160 MG/5ML PO SOLN: 650.0000 mg | ORAL | Status: DC | PRN

## 2018-06-20 MED ORDER — SODIUM CHLORIDE 0.9 % IV SOLN: 50.0000 mL | Freq: Once | INTRAVENOUS | Status: DC

## 2018-06-20 MED ORDER — ALTEPLASE (STROKE) FULL DOSE INFUSION
0.9000 mg/kg | Freq: Once | INTRAVENOUS | Status: AC
  Administered 2018-06-20: 67.3 mg via INTRAVENOUS
  Filled 2018-06-20: qty 100

## 2018-06-20 MED ORDER — SODIUM CHLORIDE 0.9 % IV SOLN
INTRAVENOUS | Status: DC
  Administered 2018-06-20 – 2018-06-26 (×5): via INTRAVENOUS

## 2018-06-20 MED ORDER — LABETALOL HCL 5 MG/ML IV SOLN
INTRAVENOUS | Status: AC
  Filled 2018-06-20: qty 4

## 2018-06-20 MED ORDER — FAMOTIDINE IN NACL 20-0.9 MG/50ML-% IV SOLN
20.0000 mg | Freq: Two times a day (BID) | INTRAVENOUS | Status: DC
  Administered 2018-06-20 – 2018-06-25 (×11): 20 mg via INTRAVENOUS
  Filled 2018-06-20 (×12): qty 50

## 2018-06-20 MED ORDER — ACETAMINOPHEN 650 MG RE SUPP: 650.0000 mg | RECTAL | Status: DC | PRN

## 2018-06-20 MED ORDER — ACETAMINOPHEN 325 MG PO TABS
650.0000 mg | ORAL_TABLET | ORAL | Status: DC | PRN
  Administered 2018-06-25 – 2018-06-26 (×2): 650 mg via ORAL
  Filled 2018-06-20 (×2): qty 2

## 2018-06-20 MED ORDER — DILTIAZEM HCL 25 MG/5ML IV SOLN
15.0000 mg | Freq: Once | INTRAVENOUS | Status: DC
  Filled 2018-06-20: qty 5

## 2018-06-20 MED ORDER — INFLUENZA VAC SPLIT HIGH-DOSE 0.5 ML IM SUSY
0.5000 mL | PREFILLED_SYRINGE | INTRAMUSCULAR | Status: AC
  Administered 2018-06-21: 0.5 mL via INTRAMUSCULAR
  Filled 2018-06-20: qty 0.5

## 2018-06-20 NOTE — Progress Notes (Signed)
Neuro NP paged regarding SCD order given patients recent history of bilateral DVTs. Keep SCDs off until further instruction per Janett Billow, NP.

## 2018-06-20 NOTE — ED Triage Notes (Signed)
Pt primary care giver /daughter arrives. Confirmed pt last known well of 8pm 06/19/18. Pt daughter states no "at 8pm she went to bed just fine and woke up fine this morning. After her bath around 950-10am she quit talking and started drooling. Her face was twisted and her L arm usually weak but she couldn't move it at all." At this time Dr. Corky Downs enters room and I immediately inform him of new information on Pts last known well. New last known well is 950 this morning, 06/20/18

## 2018-06-20 NOTE — ED Triage Notes (Addendum)
Pt arrives via [facility] from home. Per EMS Last known well 8pm 10/24/ 19. Hx of dementia/ previous CVA, pt unable to provide hx. Pt presents with flaccid L arm, facial droop, incomprehensible speech which EMS states is new for her.  Confirmed with EMS last known well time of 8pm 06/19/18.

## 2018-06-20 NOTE — ED Notes (Signed)
Report to [facility] 

## 2018-06-20 NOTE — Progress Notes (Signed)
Chaplain responded  To a page from unit secretary to support code stroke family. Daughter was at the bedside. Chaplain offered pastoral presence and prayer. Chaplain prayed and asked if there wre any other needs. Chaplain maintained a presence until [facility] arrived   06/20/18 1300  Clinical Encounter Type  Visited With Patient and family together  Visit Type Initial  Referral From Nurse  Spiritual Encounters  Spiritual Needs Prayer;Emotional

## 2018-06-20 NOTE — ED Provider Notes (Addendum)
[facility] Emergency Department Provider Note   ____________________________________________    I have reviewed the triage vital signs and the nursing notes.   HISTORY  Chief Complaint Weakness     HPI Shelley Cameron is a 84 y.o. female who presents with sudden onset slurred speech, facial droop on the left and increased left-sided weakness partially half hour prior to arrival.  EMS was under the impression that this started last night at 8 PM. history is provided by daughter who cares for the patient.  Patient has had significant morbidity from traumatic subarachnoid hemorrhage which happened several months ago which is led to her daughter caring for her.  Patient does have a history of stroke in the past.   Past Medical History:  Diagnosis Date  . A-fib ([place])   . Anemia   . Arthritis   . Cancer ([place]) 1998, 1999   breast cancer. Bilateral mastectomy.   . Diabetes mellitus without complication ([place])   . DJD (degenerative joint disease)   . Gout   . Heart murmur   . Hyperlipidemia   . Hypertension   . Lymphedema of right arm   . Moderate dementia ([place])   . Seizures ([place])    Tonic-Clonic seizure activity  . Severe mitral insufficiency   . Stroke [facility]) 2011  . Syncope   . Systolic CHF ([place])   . Urinary incontinence     Patient Active Problem List   Diagnosis Date Noted  . DVT (deep vein thrombosis) in pregnancy 03/25/2018  . Closed head injury without concussion 02/24/2018  . Anemia, unspecified 02/23/2018  . Arthritis 02/23/2018  . Congestive heart failure with left ventricular systolic dysfunction ([place]) 02/23/2018  . Diabetes mellitus type 2, uncomplicated ([place]) 63/78/5885  . Gout 02/23/2018  . Cause of injury, fall 02/13/2018  . Cardiac syncope 11/26/2017  . Sick sinus syndrome ([place]) 08/01/2016  . Chest wall pain 05/30/2016  . B12 deficiency 04/25/2016  . Edema of right lower extremity 01/10/2016  . Chronic a-fib 09/07/2014     Past Surgical History:  Procedure Laterality Date  . BREAST SURGERY Left 08/1997   Dr Smith/mastectomy  . CATARACT EXTRACTION  2000  . EYE SURGERY Bilateral    Cataract Extraction with IOL  . IVC FILTER INSERTION N/A 03/26/2018   Procedure: IVC FILTER INSERTION;  Surgeon: Katha Cabal, MD;  Location: [facility] CV LAB;  Service: Cardiovascular;  Laterality: N/A;  . MASTECTOMY Right 02/19/1997   Dr Bary Castilla  . MASTECTOMY Left 1999   Dr. Rochel Brome, [facility]  . PACEMAKER INSERTION Left 08/01/2016   Procedure: INSERTION PACEMAKER;  Surgeon: Isaias Cowman, MD;  Location: [facility] ORS;  Service: Cardiovascular;  Laterality: Left;    Prior to Admission medications   Medication Sig Start Date End Date Taking? Authorizing Provider  acetaminophen (TYLENOL) 500 MG tablet Take 1,000 mg by mouth every 6 (six) hours as needed for mild pain.   Yes [provider]  cetirizine (ZYRTEC) 10 MG tablet Take 10 mg by mouth daily as needed for allergies.    Yes [provider]  cholecalciferol (VITAMIN D) 1000 units tablet Take 2 tablets by mouth daily.   Yes [provider]  colchicine 0.6 MG tablet Take 0.6 mg by mouth as needed (Gout).    Yes [provider]  donepezil (ARICEPT) 10 MG tablet Take 10 mg by mouth at bedtime.    Yes [provider]  lamoTRIgine (LAMICTAL) 25 MG tablet Take 25 mg  by mouth 2 (two) times daily.  01/11/18  Yes [provider]  levETIRAcetam (KEPPRA) 750 MG tablet Take 750 mg by mouth 2 (two) times daily. 03/12/18  Yes [provider]  loratadine (CLARITIN) 10 MG tablet Take 10 mg by mouth daily as needed for allergies.    Yes [provider]  lovastatin (MEVACOR) 40 MG tablet Take 40 mg by mouth at bedtime.   Yes [provider]  memantine (NAMENDA) 5 MG tablet Take 5 mg by mouth 2 (two) times daily. 12/24/17  Yes [provider]  metoprolol tartrate (LOPRESSOR) 25 MG tablet Take  12.5 mg by mouth 2 (two) times daily. 03/18/18  Yes [provider]  Polyethylene Glycol 3350 (PEG 3350) POWD Take 17 g by mouth daily as needed (constipation).    Yes [provider]  QUEtiapine (SEROQUEL) 50 MG tablet Take 50 mg by mouth at bedtime. 10/30/17  Yes [provider]  senna-docusate (SENOKOT-S) 8.6-50 MG tablet Take 1 tablet by mouth daily.   Yes [provider]  traMADol (ULTRAM) 50 MG tablet Take 25 mg by mouth every 4 (four) hours as needed for moderate pain.    Yes [provider]  venlafaxine XR (EFFEXOR-XR) 37.5 MG 24 hr capsule Take 37.5 mg by mouth daily. 12/31/17  Yes [provider]  vitamin B-12 (CYANOCOBALAMIN) 1000 MCG tablet Take 1,000 mcg by mouth daily.   Yes [provider]     Allergies Codeine  Family History  Problem Relation Age of Onset  . Hypertension Father     Social History Social History   Tobacco Use  . Smoking status: Never Smoker  . Smokeless tobacco: Never Used  Substance Use Topics  . Alcohol use: No  . Drug use: No    Level 5 caveat: Unable to obtain review of Systems due to altered mental status     ____________________________________________   PHYSICAL EXAM:  VITAL SIGNS: ED Triage Vitals  Enc Vitals Group     BP 06/20/18 1047 (!) 161/77     Pulse Rate 06/20/18 1200 96     Resp 06/20/18 1047 (!) 22     Temp 06/20/18 1141 98.8 F (37.1 C)     Temp src --      SpO2 06/20/18 1045 95 %     Weight --      Height --      Head Circumference --      Peak Flow --      Pain Score --      Pain Loc --      Pain Edu? --      Excl. in [place]? --     Constitutional: Alert, ill-appearing  Head: Atraumatic. Nose: No congestion/rhinnorhea. Mouth/Throat: Mucous membranes are moist.  Drooling on the left side of the mouth Neck:  Painless ROM Cardiovascular: Normal rate, regular rhythm. Grossly normal heart sounds.  Good peripheral circulation. Respiratory: Normal  respiratory effort.  No retractions. Lungs CTAB. Gastrointestinal: Soft and nontender. No distention.   Musculoskeletal:  Warm and well perfused Neurologic: Patient with clear left-sided facial droop, slurred speech, apparently increased weakness in the left, she is not able to hold her arm up against gravity, the same goes for her left leg although daughter notes that she does have chronic lower extremely weakness bilaterally Skin:  Skin is warm, dry and intact. No rash noted.   ____________________________________________   LABS (all labs ordered are listed, but only abnormal results are displayed)  Labs  Reviewed  URINE DRUG SCREEN, QUALITATIVE ([facility] ONLY)  URINALYSIS, ROUTINE W REFLEX MICROSCOPIC  ETHANOL  CBC WITH DIFFERENTIAL/PLATELET  COMPREHENSIVE METABOLIC PANEL  TROPONIN I  PROTIME-INR  APTT   ____________________________________________  EKG  ED ECG REPORT I, Lavonia Drafts, the attending physician, personally viewed and interpreted this ECG.  Date: 06/20/2018   Rhythm: Atrial fibrillation QRS Axis: normal Intervals: normal ST/T Wave abnormalities: normal Narrative Interpretation: no evidence of acute ischemia  ____________________________________________  RADIOLOGY  CT head no intracranial hemorrhage or definite acute infarct ____________________________________________   PROCEDURES  Procedure(s) performed: No  Procedures   Critical Care performed: yes  CRITICAL CARE Performed by: Lavonia Drafts   Total critical care time:30 minutes  Critical care time was exclusive of separately billable procedures and treating other patients.  Critical care was necessary to treat or prevent imminent or life-threatening deterioration.  Critical care was time spent personally by me on the following activities: development of treatment plan with patient and/or surrogate as well as nursing, discussions with consultants, evaluation of patient's response to  treatment, examination of patient, obtaining history from patient or surrogate, ordering and performing treatments and interventions, ordering and review of laboratory studies, ordering and review of radiographic studies, pulse oximetry and re-evaluation of patient's condition.  ____________________________________________   INITIAL IMPRESSION / ASSESSMENT AND PLAN / ED COURSE  Pertinent labs & imaging results that were available during my care of the patient were reviewed by me and considered in my medical decision making (see chart for details).  Patient presents with acute stroke within the window for TPA.  Consult to neurology, stat CT head.  NIH stroke scale of 17 although this is altered by chronic lower extremity weakness.  CT does not demonstrate any cranial hemorrhage or acute stroke.  Patient seen by Dr. Irish Elders of neurology, recommends TPA, has discussed with daughter.  He recommends against CTA  I discussed with Dr. Lorraine Lax of [facility] who is accepted the patient in transfer, our ICU is full   ----------------------------------------- 12:31 PM on 06/20/2018 ----------------------------------------- Patient began coughing and had significant elevation in her heart rate.  Given recent CVA concern for aspiration, will obtain chest x-ray.  After she stopped coughing and settled down her heart rate returned to a more normal rate.  Did not have to give Cardizem     ____________________________________________   FINAL CLINICAL IMPRESSION(S) / ED DIAGNOSES  Final diagnoses:  Cerebrovascular accident (CVA), unspecified mechanism ([place])        Note:  This document was prepared using Dragon voice recognition software and may include unintentional dictation errors.    Lavonia Drafts, MD 06/20/18 9233    Lavonia Drafts, MD 06/20/18 1246    Lavonia Drafts, MD 06/20/18 [phone]

## 2018-06-20 NOTE — Progress Notes (Signed)
CODE STROKE- PHARMACY COMMUNICATION   Time CODE STROKE called/page received: 1059  Time response to CODE STROKE was made (in person or via phone): 1105  Time Stroke Kit retrieved from [place] (only if needed): 1140  Name of Provider/Nurse contacted: Lavonia Drafts, MD  Past Medical History:  Diagnosis Date  . A-fib ([place])   . Anemia   . Arthritis   . Cancer ([place]) 1998, 1999   breast cancer. Bilateral mastectomy.   . Diabetes mellitus without complication ([place])   . DJD (degenerative joint disease)   . Gout   . Heart murmur   . Hyperlipidemia   . Hypertension   . Lymphedema of right arm   . Moderate dementia ([place])   . Seizures ([place])    Tonic-Clonic seizure activity  . Severe mitral insufficiency   . Stroke [facility]) 2011  . Syncope   . Systolic CHF ([place])   . Urinary incontinence    Prior to Admission medications   Medication Sig Start Date End Date Taking? Authorizing Provider  acetaminophen (TYLENOL) 500 MG tablet Take 1,000 mg by mouth every 6 (six) hours as needed for mild pain.   Yes [provider]  cetirizine (ZYRTEC) 10 MG tablet Take 10 mg by mouth daily as needed for allergies.    Yes [provider]  cholecalciferol (VITAMIN D) 1000 units tablet Take 2 tablets by mouth daily.   Yes [provider]  colchicine 0.6 MG tablet Take 0.6 mg by mouth as needed (Gout).    Yes [provider]  donepezil (ARICEPT) 10 MG tablet Take 10 mg by mouth at bedtime.    Yes [provider]  lamoTRIgine (LAMICTAL) 25 MG tablet Take 25 mg by mouth 2 (two) times daily.  01/11/18  Yes [provider]  levETIRAcetam (KEPPRA) 750 MG tablet Take 750 mg by mouth 2 (two) times daily. 03/12/18  Yes [provider]  loratadine (CLARITIN) 10 MG tablet Take 10 mg by mouth daily as needed for allergies.    Yes [provider]  lovastatin (MEVACOR) 40 MG tablet Take 40 mg by mouth at bedtime.   Yes [provider]  memantine  (NAMENDA) 5 MG tablet Take 5 mg by mouth 2 (two) times daily. 12/24/17  Yes [provider]  metoprolol tartrate (LOPRESSOR) 25 MG tablet Take 12.5 mg by mouth 2 (two) times daily. 03/18/18  Yes [provider]  Polyethylene Glycol 3350 (PEG 3350) POWD Take 17 g by mouth daily as needed (constipation).    Yes [provider]  QUEtiapine (SEROQUEL) 50 MG tablet Take 50 mg by mouth at bedtime. 10/30/17  Yes [provider]  senna-docusate (SENOKOT-S) 8.6-50 MG tablet Take 1 tablet by mouth daily.   Yes [provider]  traMADol (ULTRAM) 50 MG tablet Take 25 mg by mouth every 4 (four) hours as needed for moderate pain.    Yes [provider]  venlafaxine XR (EFFEXOR-XR) 37.5 MG 24 hr capsule Take 37.5 mg by mouth daily. 12/31/17  Yes [provider]  vitamin B-12 (CYANOCOBALAMIN) 1000 MCG tablet Take 1,000 mcg by mouth daily.   Yes [provider]  cefUROXime (CEFTIN) 250 MG tablet Take 250 mg by mouth 2 (two) times daily. 04/24/18 06/20/18  [provider]  Cholecalciferol (VITAMIN D) 2000 units CAPS Take 2,000 Units by mouth daily.   06/20/18  [provider]    Tawnya Crook, PharmD Pharmacy Resident  06/20/2018 12:24 PM

## 2018-06-20 NOTE — ED Notes (Signed)
CODE STROKE

## 2018-06-20 NOTE — ED Notes (Signed)
EMTALA reviewed by this RN.  Transfer consent signed. ?

## 2018-06-20 NOTE — ED Notes (Signed)
Report to Brittany, RN.

## 2018-06-20 NOTE — H&P (Addendum)
NEURO HOSPITALIST  H&P   Chief Complaint: Left side weakness and numbness, left facial droop  History obtained from:  Patient / family   HPI:                                                                                                                                         Shelley Cameron is an 84 y.o. female PMH  Breast cancer s/p double mastectomy, a fib ( not on anticoagulation), seizure, stroke ( 2011), HTN, DM, traumatic SAH, bilateral DVTs, PE who presented to [facility] hospital for c/o left sided weakness, numbness, facial droop and confusion. Patient transferred to [facility] s/p TPA.  Per family patient went to bed last night about 0800 and woke up this morning in her usual state of health. At about 0945 am she started to notice some drooling, slurred speech, and her whole left side was flaccid. Denies any c/o HA, vision changes, or dizziness.  No seizure like activity was noted. Patient does have a history of seizures. Was taken to hospital 06/15/18 for seizure, but it was determined to be syncopal episode.  At baseline patient is able to feed herself. She does not bathe or dress herself and per daughter she is mostly bed bound.  Hospital course CTA: no hemorrhage BG: 89 BP: 134/114 Currently in a fib   Stroke 2011 no residual deficits   Date last known well: Date: 06/20/2018 Time last known well: Time: 09:45 tPA Given: Yes; bolus started @ 1150 @ [facility] Modified Rankin: Rankin Score=4 Initial NIHSS: 17 NIHSS post tpa: 8       Past Medical History:  Diagnosis Date  . A-fib ([place])   . Anemia   . Arthritis   . Cancer ([place]) 1998, 1999   breast cancer. Bilateral mastectomy.   . Diabetes mellitus without complication ([place])   . DJD (degenerative joint disease)   . Gout   . Heart murmur   . Hyperlipidemia   . Hypertension   . Lymphedema of right arm   . Moderate dementia ([place])   . Seizures ([place])    Tonic-Clonic seizure  activity  . Severe mitral insufficiency   . Stroke [facility]) 2011  . Syncope   . Systolic CHF ([place])   . Urinary incontinence     Past Surgical History:  Procedure Laterality Date  . BREAST SURGERY Left 08/1997   Dr Smith/mastectomy  . CATARACT EXTRACTION  2000  . EYE SURGERY Bilateral    Cataract Extraction with IOL  . IVC FILTER INSERTION N/A 03/26/2018   Procedure: IVC FILTER INSERTION;  Surgeon: Katha Cabal, MD;  Location: [facility]  INVASIVE CV LAB;  Service: Cardiovascular;  Laterality: N/A;  . MASTECTOMY Right 02/19/1997   Dr Bary Castilla  . MASTECTOMY Left 1999   Dr. Rochel Brome, [facility]  . PACEMAKER INSERTION Left 08/01/2016   Procedure: INSERTION PACEMAKER;  Surgeon: Isaias Cowman, MD;  Location: [facility] ORS;  Service: Cardiovascular;  Laterality: Left;    Family History  Problem Relation Age of Onset  . Hypertension Father        Social History:  reports that she has never smoked. She has never used smokeless tobacco. She reports that she does not drink alcohol or use drugs.  Allergies:  Allergies  Allergen Reactions  . Codeine Nausea Only    Medications:                                                                                                                           Scheduled: .  stroke: mapping our early stages of recovery book   Does not apply Once  . labetalol  20 mg Intravenous Once   Continuous: . sodium chloride 75 mL/hr at 06/20/18 1416  . clevidipine    . famotidine (PEPCID) IV     IWP:[id number] **OR** acetaminophen (TYLENOL) oral liquid 160 mg/5 mL **OR** acetaminophen, senna-docusate   ROS:                                                                                                                                        unobtainable from patient due to mental status    General Examination:                                                                                                      There were no vitals taken for this  visit.  HEENT-  Normocephalic, no lesions, without obvious abnormality.  Normal external eye and conjunctiva.  Cardiovascular- S1-S2 audible, pulses palpable throughout   Lungs-no rhonchi or wheezing noted, no excessive working breathing.  Saturations within normal  limits on 3 L [place] Abdomen- All 4 quadrants palpated and nontender Extremities- Warm, dry and intact Musculoskeletal- right arm swelling, Skin-warm and dry, no hyperpigmentation, vitiligo, or suspicious lesions  Neurological Examination Mental Status: Alert, oriented, to name/ and daughters name.  Speech fluent without evidence of aphasia. Slight dysarthria. Able to follow some simple commands. Cranial Nerves: II: Discs flat bilaterally; Visual fields grossly normal,  III,IV, VI: ptosis not present, extra-ocular motions intact bilaterally, pupils equal, round, reactive to light and accommodation V,VII: smile asymmetric, left facial droop noted. facial light touch sensation normal bilaterally VIII: hearing normal bilaterally IX,X: uvula rises symmetrically XI: bilateral shoulder shrug XII: midline tongue extension Motor: Right : Upper extremity   4/5  Left:     Upper extremity   4/5  Lower extremity   4/5   Lower extremity   3/5 Tone and bulk:normal tone throughout; no atrophy noted Sensory: decreased sensation on the left Deep Tendon Reflexes: 2+ and symmetric biceps and patella Plantars: Right: downgoing   Left: downgoing Cerebellar: UTA Gait: deferred   Lab Results: Basic Metabolic Panel: Recent Labs  Lab 06/15/18 1529 06/20/18 1207  NA 138 141  K 3.1* 3.1*  CL 100 102  CO2 25 28  GLUCOSE 110* 89  BUN 11 8  CREATININE 1.02* 0.93  CALCIUM 8.3* 8.7*    CBC: Recent Labs  Lab 06/15/18 1529 06/20/18 1207  WBC 11.9* 7.9  NEUTROABS 6.0 5.4  HGB 10.5* 11.1*  HCT 33.5* 34.8*  MCV 81.1 80.4  PLT 261 265    Lipid Panel: No results for input(s): CHOL, TRIG, HDL, CHOLHDL, VLDL, LDLCALC in the last 168  hours.  CBG: Recent Labs  Lab 06/15/18 1531 06/20/18 1212  GLUCAP 112* 80    Imaging: Dg Chest Portable 1 View  Result Date: 06/20/2018 CLINICAL DATA:  Shortness of breath. EXAM: PORTABLE CHEST 1 VIEW COMPARISON:  Chest x-ray 03/25/2018 and chest CT 03/25/2018 FINDINGS: The cardiac silhouette, mediastinal and hilar contours are within normal limits. There is moderate eventration of both hemidiaphragms with some overlying atelectasis. No infiltrates, edema or effusions. The bony thorax is intact. IMPRESSION: No acute cardiopulmonary findings. Electronically Signed   By: Marijo Sanes M.D.   On: 06/20/2018 13:05   Ct Head Code Stroke Wo Contrast  Result Date: 06/20/2018 CLINICAL DATA:  Code stroke. Worsening left-sided weakness. New onset drooling. History of prior stroke and dementia. EXAM: CT HEAD WITHOUT CONTRAST TECHNIQUE: Contiguous axial images were obtained from the base of the skull through the vertex without intravenous contrast. COMPARISON:  03/25/2018 FINDINGS: Brain: There is no evidence of acute large territory infarct, intracranial hemorrhage, mass, midline shift, or extra-axial fluid collection. Chronic right frontal and left frontoparietal infarcts are again noted. Patchy to confluent hypodensities throughout the cerebral white matter similar to the prior study and nonspecific but compatible with extensive chronic small vessel ischemic disease. Multiple infarcts in the pons are new or increased in number compared to the prior study but are largely chronic in appearance. There is advanced cerebral atrophy including prominent bilateral temporal lobe atrophy. Vascular: Calcified atherosclerosis at the skull base. No hyperdense vessel. Skull: No fracture or destructive osseous lesion. Unchanged small exostoses from the outer table of the frontal bone. Sinuses/Orbits: . The visualized paranasal sinuses and mastoid air The visualized orbits cells are clear are unremarkable. Other: None.  ASPECTS [facility] Stroke Program Early CT Score) - Ganglionic level infarction (caudate, lentiform nuclei, internal capsule, insula, M1-M3 cortex): 7 - Supraganglionic infarction (M4-M6 cortex): 3  Total score (0-10 with 10 being normal): 10 IMPRESSION: 1. No intracranial hemorrhage or definite acute infarct. 2. New infarcts in the pons, largely chronic in appearance though a superimposed acute or subacute infarct is not excluded. 3. ASPECTS is 10. 4. Extensive cerebral atrophy and chronic small vessel ischemic disease with old infarcts as above. These results were called by telephone at the time of interpretation on 06/20/2018 at 11:33 am to Dr. Lavonia Drafts , who verbally acknowledged these results. Electronically Signed   By: Logan Bores M.D.   On: 06/20/2018 11:35       Laurey Morale, MSN, NP-C [facility] [phone]   06/20/2018, 2:05 PM   Attending physician note to follow with Assessment and plan .   Assessment: 84 y.o. female  PMH  Breast cancer s/p double mastectomy, a fib ( not on anticoagulation), seizure, stroke ( 2011), HTN, DM, traumatic SAH, bilateral DVTs, PE who presented to [facility] for c/o left sided weakness, numbness, facial droop and confusion. Patient transferred to [facility] 4[facility] s/p TPA.no mechanical thrombectomy d/t baseline dementia and modified rankin of 4.  Impression: Stroke Risk Factors - atrial fibrillation, diabetes mellitus and hypertension Etiology : unknown; likely cardia cd/t atrial fibrillation. Further stroke work-up needed   Plan: -- BP goal  180/136mmHg, --NO BLOOD thinners  --MRI Brain  --Echocardiogram -- High intensity Statin if LDL > 70 -- HgbA1c, fasting lipid panel -- PT consult, OT consult, Speech consult --Telemetry monitoring --Frequent neuro checks  CNS -Close neuro monitoring  RESP None Continue to monitor  CV Essential (primary) hypertension - BP control, goal SBP <  180/105 -labetalol/cleviprex -TTE -Continue BB -Cards Consult   Chronic atrial -fibrillation -Rate control -Continue BB -Repeat CT in 2 weeks for consideration of starting anticoagulation if CT is stable. Coumadin with goal INR 2-3 and stop  antiplatelet once INR >2, provide Coumadin teaching.     GI/GU CKD Stage 3 (GFR 30-59) -Gentle hydration -avoid nephrotoxic agents      ENDO Type 2 diabetes mellitus without complication -goal [id number] < 7    ID Possible Aspiration PNA -CXR -NPO until bedside swallow eval or cleared by speech -Monitor  Possible UTI -Foley catheter present on arrival -CX pending    Prophylaxis DVT: SCD only. NO BLOOD THINNERS GI: DOC/SENNA   Dispo: TBD  Diet: NPO until cleared by speech or bedside swallow eval  Code Status:DNR/ DNI   THE FOLLOWING WERE PRESENT ON ADMISSION: Renal -  CKD3,  Heme-  Coagulopathy secondary to tPA at outside hospital,    NEUROHOSPITALIST ADDENDUM Performed a face to face diagnostic evaluation.   I have reviewed the contents of history and physical exam as documented by PA/ARNP/Resident and agree with above documentation.  I have discussed and formulated the above plan as documented. Edits to the note have been made as needed.  84 year old female with history of breast cancer,prior stroke, A. fib not on anticoagulation with MRS of 4 presented to [facility] hospital emergency room with left-sided weakness confusion.  Received IV TPA and transferred to [facility] due to shortage of ICU beds at [facility].  On arrival patient has improved, follows commands and has mild left-sided weakness.  She is not a candidate for mechanical thrombectomy given her poor baseline and therefore we did not perform stat CTA on arrival.  Daughter also wishes her to be a DNR/DNI case she worsens.  Patient will be watched in the ICU overnight.  Blood pressure goal less than 180/105.  Remaining stroke  work-up to be completed.    Karena Addison Breane Grunwald MD [facility] [phone]   If 7pm to 7am, please call on call as listed on AMION.     This patient is neurologically critically ill due to stroke s/p TPA.  She is at risk for significant risk of neurological worsening from cerebral edema,  death from brain herniation, heart failure, hemorrhagic conversion, infection, respiratory failure and seizure. This patient's care requires constant monitoring of vital signs, hemodynamics, respiratory and cardiac monitoring, review of multiple databases, neurological assessment, discussion with family, other specialists and medical decision making of high complexity.  I spent 45  minutes of neurocritical time in the care of this patient.

## 2018-06-20 NOTE — ED Notes (Signed)
CBG 82 

## 2018-06-20 NOTE — Consult Note (Addendum)
Referring Physician: Lavonia Drafts    Chief Complaint: Left sided weakness and numbness, left facial droop, confusion and dysarthria.  HPI: Shelley Cameron is an 84 y.o. female with multiple medical issues including atrial fibrillation not on anticoagulation, dementia, seizure, stroke, anemia, hypertension, diabetes mellitus, breast cancer, traumatic subarachnoid hemorrhage, bilateral DVTs, pulmonary embolism, presenting to the ED with complaints of left sided weakness and numbness, drooling, facial droop, confusion and dysarthria.  Patient's daughter who is at the bedside states that patient went to bed at around 8 pm on 06/19/2018 and woke up at baseline with no issues.  Per patient's daughter, symptoms started around 9:45 - 10:00 AM with notable drooling, slurred speech and left side flaccidity.  Patient did not complain of headache, vision disturbances, dizziness, or any other focal neurological deficit.  There was no report of seizures or seizure-like activity.  She does have history of tonic-clonic seizure most recent seizure episode on 06/15/2018.  Due to concerns for stroke she decided to take her to the ED for further evaluation.  On arrival to the ED, patient was noted to have slurred speech with left facial droop and drooling, she was confused but daughter state this is at baseline.  Initial NIH stroke scale of 17.  CT head was obtained which showed no acute intracranial abnormality or evidence of hemorrhage therefore she was deemed a candidate for tPA.  Date last known well: Date: 06/20/2018 Time last known well: Time: 10:50 am tPA Given: Yes  Past Medical History:  Diagnosis Date  . A-fib ([place])   . Anemia   . Arthritis   . Cancer ([place]) 1998, 1999   breast cancer. Bilateral mastectomy.   . Diabetes mellitus without complication ([place])   . DJD (degenerative joint disease)   . Gout   . Heart murmur   . Hyperlipidemia   . Hypertension   . Lymphedema of right arm   . Moderate dementia  ([place])   . Seizures ([place])    Tonic-Clonic seizure activity  . Severe mitral insufficiency   . Stroke [facility]) 2011  . Syncope   . Systolic CHF ([place])   . Urinary incontinence     Past Surgical History:  Procedure Laterality Date  . BREAST SURGERY Left 08/1997   Dr Smith/mastectomy  . CATARACT EXTRACTION  2000  . EYE SURGERY Bilateral    Cataract Extraction with IOL  . IVC FILTER INSERTION N/A 03/26/2018   Procedure: IVC FILTER INSERTION;  Surgeon: Katha Cabal, MD;  Location: [facility];  Service: Cardiovascular;  Laterality: N/A;  . MASTECTOMY Right 02/19/1997   Dr Bary Castilla  . MASTECTOMY Left 1999   Dr. Rochel Brome, [facility]  . PACEMAKER INSERTION Left 08/01/2016   Procedure: INSERTION PACEMAKER;  Surgeon: Isaias Cowman, MD;  Location: [facility] ORS;  Service: Cardiovascular;  Laterality: Left;    Family History  Problem Relation Age of Onset  . Hypertension Father    Social History:  reports that she has never smoked. She has never used smokeless tobacco. She reports that she does not drink alcohol or use drugs.  Allergies:  Allergies  Allergen Reactions  . Codeine Nausea Only    Medications:  I have reviewed the patient's current medications. Prior to Admission:  (Not in a hospital admission) Scheduled: . labetalol        ROS: Unable to obtain history due to current mental status.  Physical Examination: Blood pressure (!) 177/91, resp. rate 15, SpO2 95 %.   HEENT-  Normocephalic,  no lesions, without obvious abnormality.  Normal external eye and conjunctiva.  Normal TM's bilaterally.  Normal auditory canals and external ears. Normal external nose, mucus membranes and septum.  Normal pharynx. Cardiovascular- S1, S2 normal, pulses palpable throughout   Lungs- chest clear, no wheezing, rales, normal symmetric air entry Abdomen- soft, non-tender; bowel sounds normal; no masses,  no organomegaly Extremities- no edema Lymph-no adenopathy  palpable Musculoskeletal-no joint tenderness, deformity or swelling Skin-warm and dry, no hyperpigmentation, vitiligo, or suspicious lesions  Neurological Exam   Mental Status: Alert, disoriented to place time and situation.  Confused at baseline due to history of dementia. Speech is slurred with evidence of aphasia.  Able to follow two-step simple commands with some difficulty noted. Attention span and concentration seemed mildly inappropriate requires re-orientation and redirection. Cranial Nerves: II: Discs flat bilaterally; blinks to visual threats bilaterally, pupils equal, round, reactive to light and accommodation III,IV, VI: ptosis not present, extra-ocular motions intact bilaterally V,VII: Left facial asymmetry, facial light touch sensation  increased on the left VIII: hearing normal bilaterally IX,X: gag reflex deferred XI: Unable to assess XII: midline tongue extension Motor: Right :  Upper extremity   4/5 Without pronator drift      Left: Upper extremity   1/5  Right:  Able to break gravity, weakness and swelling due to bilateral DVT Tone and bulk:normal tone throughout; no atrophy noted Sensory: Pinprick and light touch  decreased on the left Deep Tendon Reflexes: 2+ and symmetric throughout Plantars: Right: Downgoing                           Left: Downgoing Cerebellar: Finger-to-nose testing intact on the right.  Unable to perform Heel to shin testing due to lower extremity weakness and pain as a result of bilateral DVT.   Gait: not tested due to safety concerns  Data Reviewed  Laboratory Studies:  Basic Metabolic Panel: Recent Labs  Lab 06/15/18 1529  NA 138  K 3.1*  CL 100  CO2 25  GLUCOSE 110*  BUN 11  CREATININE 1.02*  CALCIUM 8.3*    Liver Function Tests: Recent Labs  Lab 06/15/18 1529  AST 19  ALT 9  ALKPHOS 99  BILITOT 0.4  PROT 7.0  ALBUMIN 3.0*   No results for input(s): LIPASE, AMYLASE in the last 168 hours. No results for input(s):  AMMONIA in the last 168 hours.  CBC: Recent Labs  Lab 06/15/18 1529  WBC 11.9*  NEUTROABS 6.0  HGB 10.5*  HCT 33.5*  MCV 81.1  PLT 261    Cardiac Enzymes: Recent Labs  Lab 06/15/18 1529 06/15/18 2026  TROPONINI 0.08* 0.08*    BNP: Invalid input(s): POCBNP  CBG: Recent Labs  Lab 06/15/18 1531  GLUCAP 112*    Microbiology: Results for orders placed or performed during the hospital encounter of 03/06/18  Urine culture     Status: None   Collection Time: 03/06/18  9:35 AM  Result Value Ref Range Status   Specimen Description   Final    URINE, RANDOM Performed at [facility], [address] [place], [place] [address]    Special Requests   Final    NONE Performed at [facility], [address] [place], [place] [address]    Culture   Final    NO GROWTH Performed at [facility], [place] [address] [place], [place] [address]    Report Status 03/07/2018 FINAL  Final  Coagulation Studies: No results for input(s): LABPROT, INR in the last 72 hours.  Urinalysis:  Recent Labs  Lab 06/15/18 1705  COLORURINE YELLOW  LABSPEC 1.020  PHURINE 6.0  GLUCOSEU NEGATIVE  HGBUR NEGATIVE  BILIRUBINUR NEGATIVE  KETONESUR NEGATIVE  PROTEINUR >300*  NITRITE NEGATIVE  LEUKOCYTESUR NEGATIVE    Lipid Panel: No results found for: CHOL, TRIG, HDL, CHOLHDL, VLDL, LDLCALC  HgbA1C:  Lab Results  Component Value Date   HGBA1C 5.9 (H) 05/30/2016    Urine Drug Screen:  No results found for: LABOPIA, COCAINSCRNUR, LABBENZ, AMPHETMU, THCU, LABBARB  Alcohol Level: No results for input(s): ETH in the last 168 hours.  Other results: EKG: atrial fibrillation, rate controlled Vent. rate 81 BPM PR interval * ms QRS duration 89 ms QT/QTc 414/481 ms P-R-T axes * 44 -71  Imaging: Ct Head Code Stroke Wo Contrast  Result Date: 06/20/2018 CLINICAL DATA:  Code stroke. Worsening left-sided weakness. New onset drooling. History of prior stroke and  dementia. EXAM: CT HEAD WITHOUT CONTRAST TECHNIQUE: Contiguous axial images were obtained from the base of the skull through the vertex without intravenous contrast. COMPARISON:  03/25/2018 FINDINGS: Brain: There is no evidence of acute large territory infarct, intracranial hemorrhage, mass, midline shift, or extra-axial fluid collection. Chronic right frontal and left frontoparietal infarcts are again noted. Patchy to confluent hypodensities throughout the cerebral white matter similar to the prior study and nonspecific but compatible with extensive chronic small vessel ischemic disease. Multiple infarcts in the pons are new or increased in number compared to the prior study but are largely chronic in appearance. There is advanced cerebral atrophy including prominent bilateral temporal lobe atrophy. Vascular: Calcified atherosclerosis at the skull base. No hyperdense vessel. Skull: No fracture or destructive osseous lesion. Unchanged small exostoses from the outer table of the frontal bone. Sinuses/Orbits: . The visualized paranasal sinuses and mastoid air The visualized orbits cells are clear are unremarkable. Other: None. ASPECTS [facility] Stroke Program Early CT Score) - Ganglionic level infarction (caudate, lentiform nuclei, internal capsule, insula, M1-M3 cortex): 7 - Supraganglionic infarction (M4-M6 cortex): 3 Total score (0-10 with 10 being normal): 10 IMPRESSION: 1. No intracranial hemorrhage or definite acute infarct. 2. New infarcts in the pons, largely chronic in appearance though a superimposed acute or subacute infarct is not excluded. 3. ASPECTS is 10. 4. Extensive cerebral atrophy and chronic small vessel ischemic disease with old infarcts as above. These results were called by telephone at the time of interpretation on 06/20/2018 at 11:33 am to Dr. Lavonia Drafts , who verbally acknowledged these results. Electronically Signed   By: Logan Bores M.D.   On: 06/20/2018 11:35    Assessment: 84 y.o.  female with multiple medical issues including atrial fibrillation not on anticoagulation, dementia, seizure, stroke, anemia, hypertension, diabetes mellitus, breast cancer, traumatic subarachnoid hemorrhage, bilateral DVTs, pulmonary embolism, presenting to the ED with complaints of left sided weakness and numbness, drooling, facial droop, confusion and dysarthria.  Initial CT Brain Stroke Code 06/20/2018: No intracranial hemorrhage or definitive acute infarct. Emergent Interventions (with assumptions on delivery): TPA: The patient did qualify for IV-tPA. Alternative qualifying criteria were met prior to administration of IV-tPA.  Pertinent labs and vital signs reviewed. Risk and benefits of IV TPA discussed with patient daughter who is the POA.  Patient was not on anticoagulation or antiplatelet prior to this event.  Stroke Risk Factors - atrial fibrillation, diabetes mellitus, family history, hyperlipidemia and hypertension  Plan:  Acute CVA: 1.  Will give tPA and  transferred to [facility] 2.  Vital signs per TPA protocol until transfer 3.  Neurochecks per TPA protocol until transfer  History of Seizures last episode on 06/15/2018 1. Continue current AEDS 2. Hold Tramadol 3. Seizure precautions   This patient was staffed with Dr. Irish Elders, [facility] Frame who personally evaluated patient, reviewed documentation and agreed with assessment and plan of care as above.  Rufina Falco, DNP, FNP-BC Board certified Nurse Practitioner Neurology Department    06/20/2018, 11:39 AM

## 2018-06-20 NOTE — Progress Notes (Signed)
Pt has unconditional pacemaker.  Information scanned into chart under media tab under chart review.

## 2018-06-21 ENCOUNTER — Inpatient Hospital Stay (HOSPITAL_COMMUNITY): Payer: Medicare HMO

## 2018-06-21 ENCOUNTER — Other Ambulatory Visit (HOSPITAL_COMMUNITY): Payer: Medicare HMO

## 2018-06-21 DIAGNOSIS — I340 Nonrheumatic mitral (valve) insufficiency: Secondary | ICD-10-CM

## 2018-06-21 DIAGNOSIS — I639 Cerebral infarction, unspecified: Secondary | ICD-10-CM

## 2018-06-21 DIAGNOSIS — L8990 Pressure ulcer of unspecified site, unspecified stage: Secondary | ICD-10-CM

## 2018-06-21 DIAGNOSIS — I351 Nonrheumatic aortic (valve) insufficiency: Secondary | ICD-10-CM

## 2018-06-21 LAB — LIPID PANEL
Cholesterol: 130 mg/dL (ref 0–200)
HDL: 51 mg/dL (ref >40)
LDL Cholesterol: 62 mg/dL (ref 0–99)
Total CHOL/HDL Ratio: 2.5 ratio
Triglycerides: 84 mg/dL (ref <150)
VLDL: 17 mg/dL (ref 0–40)

## 2018-06-21 LAB — ECHOCARDIOGRAM COMPLETE

## 2018-06-21 LAB — HEMOGLOBIN A1C
Hgb A1c MFr Bld: 5.8 % — ABNORMAL HIGH (ref 4.8–5.6)
Mean Plasma Glucose: 119.76 mg/dL

## 2018-06-21 MED ORDER — METOPROLOL TARTRATE 5 MG/5ML IV SOLN
INTRAVENOUS | Status: AC
  Filled 2018-06-21: qty 5

## 2018-06-21 MED ORDER — MEMANTINE HCL 5 MG PO TABS
5.0000 mg | ORAL_TABLET | Freq: Two times a day (BID) | ORAL | Status: DC
  Administered 2018-06-21 – 2018-06-26 (×8): 5 mg via ORAL
  Filled 2018-06-21 (×11): qty 1

## 2018-06-21 MED ORDER — POTASSIUM CHLORIDE CRYS ER 20 MEQ PO TBCR
20.0000 meq | EXTENDED_RELEASE_TABLET | Freq: Two times a day (BID) | ORAL | Status: DC
  Administered 2018-06-21: 20 meq via ORAL
  Filled 2018-06-21: qty 1

## 2018-06-21 MED ORDER — PRAVASTATIN SODIUM 40 MG PO TABS
40.0000 mg | ORAL_TABLET | Freq: Every day | ORAL | Status: DC
  Administered 2018-06-21 – 2018-06-25 (×4): 40 mg via ORAL
  Filled 2018-06-21 (×4): qty 1

## 2018-06-21 MED ORDER — VITAMIN B-12 1000 MCG PO TABS
1000.0000 ug | ORAL_TABLET | Freq: Every day | ORAL | Status: DC
  Administered 2018-06-23 – 2018-06-26 (×4): 1000 ug via ORAL
  Filled 2018-06-21 (×6): qty 1

## 2018-06-21 MED ORDER — DONEPEZIL HCL 10 MG PO TABS
10.0000 mg | ORAL_TABLET | Freq: Every day | ORAL | Status: DC
  Administered 2018-06-21 – 2018-06-25 (×4): 10 mg via ORAL
  Filled 2018-06-21 (×4): qty 1

## 2018-06-21 MED ORDER — LEVETIRACETAM 750 MG PO TABS
750.0000 mg | ORAL_TABLET | Freq: Two times a day (BID) | ORAL | Status: DC
  Filled 2018-06-21: qty 1

## 2018-06-21 MED ORDER — METOPROLOL TARTRATE 25 MG PO TABS
25.0000 mg | ORAL_TABLET | Freq: Two times a day (BID) | ORAL | Status: DC
  Administered 2018-06-21 – 2018-06-24 (×4): 25 mg via ORAL
  Filled 2018-06-21 (×4): qty 1

## 2018-06-21 MED ORDER — POTASSIUM CHLORIDE 20 MEQ/15ML (10%) PO SOLN
20.0000 meq | Freq: Two times a day (BID) | ORAL | Status: DC
  Administered 2018-06-21: 20 meq via ORAL
  Filled 2018-06-21: qty 15

## 2018-06-21 MED ORDER — ASPIRIN 300 MG RE SUPP
300.0000 mg | Freq: Every day | RECTAL | Status: DC
  Administered 2018-06-21 – 2018-06-22 (×2): 300 mg via RECTAL
  Filled 2018-06-21 (×2): qty 1

## 2018-06-21 MED ORDER — SODIUM CHLORIDE 0.9 % IV SOLN
750.0000 mg | Freq: Two times a day (BID) | INTRAVENOUS | Status: DC
  Administered 2018-06-21: 750 mg via INTRAVENOUS
  Filled 2018-06-21 (×2): qty 7.5

## 2018-06-21 MED ORDER — LAMOTRIGINE 25 MG PO TABS
25.0000 mg | ORAL_TABLET | Freq: Two times a day (BID) | ORAL | Status: DC
  Administered 2018-06-21 – 2018-06-26 (×9): 25 mg via ORAL
  Filled 2018-06-21 (×10): qty 1

## 2018-06-21 MED ORDER — METOPROLOL TARTRATE 5 MG/5ML IV SOLN
5.0000 mg | Freq: Once | INTRAVENOUS | Status: AC
  Administered 2018-06-21: 2.5 mg via INTRAVENOUS

## 2018-06-21 MED ORDER — METOPROLOL TARTRATE 5 MG/5ML IV SOLN
INTRAVENOUS | Status: AC
  Administered 2018-06-21: 5 mg
  Filled 2018-06-21: qty 5

## 2018-06-21 MED ORDER — LORATADINE 10 MG PO TABS
10.0000 mg | ORAL_TABLET | Freq: Every day | ORAL | Status: DC
  Administered 2018-06-23 – 2018-06-26 (×4): 10 mg via ORAL
  Filled 2018-06-21 (×4): qty 1

## 2018-06-21 MED ORDER — METOPROLOL TARTRATE 5 MG/5ML IV SOLN
5.0000 mg | Freq: Three times a day (TID) | INTRAVENOUS | Status: DC | PRN
  Administered 2018-06-22: 5 mg via INTRAVENOUS
  Filled 2018-06-21: qty 5

## 2018-06-21 MED ORDER — LEVETIRACETAM 100 MG/ML PO SOLN
750.0000 mg | Freq: Two times a day (BID) | ORAL | Status: DC
  Administered 2018-06-21: 750 mg via ORAL
  Filled 2018-06-21: qty 10

## 2018-06-21 MED ORDER — POTASSIUM CHLORIDE 10 MEQ/100ML IV SOLN
10.0000 meq | INTRAVENOUS | Status: AC
  Administered 2018-06-21: 10 meq via INTRAVENOUS
  Filled 2018-06-21 (×3): qty 100

## 2018-06-21 MED ORDER — QUETIAPINE FUMARATE 50 MG PO TABS
50.0000 mg | ORAL_TABLET | Freq: Every day | ORAL | Status: DC
  Administered 2018-06-21 – 2018-06-25 (×4): 50 mg via ORAL
  Filled 2018-06-21: qty 2
  Filled 2018-06-21 (×3): qty 1

## 2018-06-21 MED ORDER — VITAMIN D 1000 UNITS PO TABS
1000.0000 [IU] | ORAL_TABLET | Freq: Every day | ORAL | Status: DC
  Administered 2018-06-23: 1000 [IU] via ORAL
  Filled 2018-06-21: qty 1

## 2018-06-21 MED ORDER — VENLAFAXINE HCL ER 37.5 MG PO CP24
37.5000 mg | ORAL_CAPSULE | Freq: Every day | ORAL | Status: DC
  Administered 2018-06-23 – 2018-06-26 (×4): 37.5 mg via ORAL
  Filled 2018-06-21 (×5): qty 1

## 2018-06-21 NOTE — Progress Notes (Signed)
STROKE TEAM PROGRESS NOTE   HISTORY OF PRESENT ILLNESS (per record) Shelley Cameron is an 84 y.o. female PMH  Breast cancer s/p double mastectomy, a fib ( not on anticoagulation), seizure, stroke ( 2011), HTN, DM, traumatic SAH, dementia, bilateral DVTs, PE who presented to [facility] hospital for c/o left sided weakness, numbness, facial droop and confusion. Patient transferred to [facility], [facility], [facility] s/p TPA.  Per family patient went to bed last night about 0800 and woke up this morning in her usual state of health. At about 0945 am she started to notice some drooling, slurred speech, and her whole left side was flaccid. Denies any c/o HA, vision changes, or dizziness.  No seizure like activity was noted. Patient does have a history of seizures. Was taken to hospital 06/15/18 for seizure, but it was determined to be syncopal episode.  At baseline patient is able to feed herself. She does not bathe or dress herself and per daughter she is mostly bed bound.  Hospital course CTA: no hemorrhage BG: 89 BP: 134/114 Currently in a fib   Stroke 2011 no residual deficits   Date last known well: Date: 06/20/2018 Time last known well: Time: 09:45 tPA Given: Yes; bolus started @ 1150 @ [facility] Modified Rankin: Rankin Score=4 Initial NIHSS: 17 NIHSS post tpa: 8   SUBJECTIVE (INTERVAL HISTORY) Her family is at bedside. Per family when she woke up fine but then became flaccid on the left side with dysarthria and facial droop. No seizure-like activity. At baseline she is mostly bed bound.  Patient was on Xarelto in the past which was stopped due to fall and hemorrhage. She has not been on anti-platelet medication at home. She lives with her daughter who is at bedside. Patient is improved today, speech is better, she is lethargic at the moment due to being tired.    OBJECTIVE Vitals:   06/21/18 0700 06/21/18 0800 06/21/18 0900 06/21/18 1000  BP: (!) 122/55 129/79 126/69 112/69  Pulse:  76 86 67  Resp: (!) 22  17 (!) 22 15  Temp:  98.3 F (36.8 C)    TempSrc:  Axillary    SpO2:  100% 100% 100%    CBC:  Recent Labs  Lab 06/15/18 1529 06/20/18 1207  WBC 11.9* 7.9  NEUTROABS 6.0 5.4  HGB 10.5* 11.1*  HCT 33.5* 34.8*  MCV 81.1 80.4  PLT 261 656    Basic Metabolic Panel:  Recent Labs  Lab 06/15/18 1529 06/20/18 1207  NA 138 141  K 3.1* 3.1*  CL 100 102  CO2 25 28  GLUCOSE 110* 89  BUN 11 8  CREATININE 1.02* 0.93  CALCIUM 8.3* 8.7*    Lipid Panel:     Component Value Date/Time   CHOL 130 06/21/2018 0627   TRIG 84 06/21/2018 0627   HDL 51 06/21/2018 0627   CHOLHDL 2.5 06/21/2018 0627   VLDL 17 06/21/2018 0627   LDLCALC 62 06/21/2018 0627   HgbA1c:  Lab Results  Component Value Date   HGBA1C 5.8 (H) 06/21/2018   Urine Drug Screen: No results found for: LABOPIA, COCAINSCRNUR, LABBENZ, AMPHETMU, THCU, LABBARB  Alcohol Level     Component Value Date/Time   ETH <10 06/20/2018 1207    IMAGING   Ct Head Wo Contrast 06/21/2018 IMPRESSION:  1. No acute intracranial process.  2. Old bilateral MCA territory infarcts.  3. Severe chronic small vessel ischemic changes and old lacunar infarcts.  4. Severe atrophy most conspicuous within mesial temporal lobes associated  with neuro degenerative syndromes.    Dg Chest Portable 1 View 06/20/2018 IMPRESSION:  No acute cardiopulmonary findings.     Ct Head Code Stroke Wo Contrast 06/20/2018 IMPRESSION:  1. No intracranial hemorrhage or definite acute infarct.  2. New infarcts in the pons, largely chronic in appearance though a superimposed acute or subacute infarct is not excluded.  3. ASPECTS is 10.  4. Extensive cerebral atrophy and chronic small vessel ischemic disease with old infarcts as above.       Carotid Dopplers - pending    Transthoracic Echocardiogram - pending 00/00/00     PHYSICAL EXAM Blood pressure 112/69, pulse 67, temperature 98.3 F (36.8 C), temperature source Axillary, resp. rate  15, SpO2 100 %.  PHYSICAL EXAM Physical exam: Exam: Gen: NAD Eyes: anicteric sclerae, moist conjunctivae                    CV: no MRG, no carotid bruits, no peripheral edema Mental Status: she is very lethargic, is arousable and answers some questions(name) and follows simple commands but falls right back to sleep.  Neuro: Detailed Neurologic Exam  Speech:    +Dysarthria, patient is also very lethargic currently  Cranial Nerves:    The pupils are equal, round, and reactive to light.. Attempted, Fundi not visualized.  EOMI. No gaze preference. Visual fields full. Left lower facial droop, blinks to threat bilat, Tongue midline. Hearing intact to voice. Shoulder shrug intact, tongue and uvula midline  Motor Observation:    no involuntary movements noted. Tone appears normal.     Strength:    Difficult exam due to lethargy, she can move all her extremities anti-gravity, appears to be weaker on the left      Sensation:  Withdraws x 4   Plantars equiv  Reflexes: symmetric.     ASSESSMENT/PLAN [name] is a 84 y.o. female with history of breast cancer s/p double mastectomy, a fib ( not on anticoagulation), PPM, seizure, stroke ( 2011), HTN, DM, traumatic SAH, dementia, bilateral DVTs, and PE (IVC filter) presenting with left side flacid, numbness, facial droop, drooling, slurred speech and confusion. tPA Given: Yes; bolus started @ 1150 @ [facility] Friday 06/20/2018  Suspected stroke treated with IV tPA:  CT head negative but likely a stroke that cannot be seen, cannot have MRI due to pacemaker  Resultant  Left lower facial droop, dysarthria, left hemiparesis. Improving but today lethargic likely tired from admission  CT head -  New infarcts in the pons, largely chronic in appearance though a superimposed acute or subacute infarct is not excluded. Repeat CT stable, no new findings.  MRI head - [place]  MRA head - Pacemaker  Carotid Dopplers Pending  Carotid Doppler  - CTA neck performed or pending - carotid dopplers not indicated.  2D Echo - pending  LDL - pending  HgbA1c - 5.8  VTE prophylaxis - SCDs  Diet - NPO  No antithrombotic prior to admission, now on ASA suppository 300 mg daily. Discharge on ASA 81mg .  Patient will be counseled to be compliant with her antithrombotic medications  Ongoing aggressive stroke risk factor management  Therapy recommendations:  pending  Disposition:  Pending  Hypertension  Stable . Permissive hypertension SBP goal < 180 mmHg s/p tPA . Long-term BP goal normotensive  Hyperlipidemia  Lipid lowering medication PTA:  Mevacor 40 mg daily  LDL - pending, goal < 70  Current lipid lowering medication: none - NPO  Continue statin at discharge  Diabetes  HgbA1c 5.8, goal < 7.0  Controlled  Other Stroke Risk Factors  Advanced age  recommend weight loss, diet and exercise as appropriate   Hx stroke/TIA  Atrial fibrillation not on anticoagulation due to hemorrhage. Not on anti-platelets at home. Start ASA 325 and discharge on ASA 81mg .   Other Active Problems  Seizure Hx - Keppra, Lamictal.   Passed swallow restart home oral medications  Hypokalemia - 3.1 -> supplement - recheck  Mild anemia    Plan  Start ASA 325mg  24 hours after TPA and discharge on ASA 81mg   Restart oral meds and when PO access is available.   Hospital day # 1  Personally examined patient and images, and have participated in and made any corrections needed to history, physical, neuro exam,assessment and plan as stated above.  I have personally obtained the history, evaluated lab date, reviewed imaging studies and agree with radiology interpretations.    Sarina Ill, MD Stroke Neurology   To contact Stroke Continuity provider, please refer to [email or web address] After hours, contact General Neurology

## 2018-06-21 NOTE — Evaluation (Signed)
Physical Therapy Evaluation Patient Details Name: Shelley Cameron MRN: [id number] DOB: Aug 04, 1930 Today's Date: 06/21/2018   History of Present Illness  84 y.o. female  PMH  Breast cancer s/p double mastectomy, a fib ( not on anticoagulation), seizure, stroke ( 2011), HTN, DM, traumatic SAH, bilateral DVTs, PE who presented to [facility] for c/o left sided weakness, numbness, facial droop and confusion. Patient transferred to [facility] 4N s/p TPA. Initial CT imaging negative for acute findings, though infarcts in the pons, largely chronic in appearance, old bilateral MCA infarcts noted.   Clinical Impression  Pt admitted with s/s of stroke.  She is dependent on all care and sadly not appropriate for PT services.  Pt's dtr given a few helpful tidbits of information, but has used some of the better assist devices and declines further use due to home and time constraints.  Will sign off at this time.    Follow Up Recommendations No PT follow up;Other (comment)(Assist to folllow up with medicaid,)    Equipment Recommendations  Other (comment)(Could use a more functional hospital bed.)    Recommendations for Other Services       Precautions / Restrictions Precautions Precautions: Fall Precaution Comments: tip given for even better skin protection      Mobility  Bed Mobility Overal bed mobility: Needs Assistance Bed Mobility: Rolling Rolling: Total assist         General bed mobility comments: dependent during rolling as ift to do pericare.  pt needed to be placed appropriately for her to stay sidelying.  Transfers                 General transfer comment: NT  Ambulation/Gait             General Gait Details: not able  Stairs            Wheelchair Mobility    Modified Rankin (Stroke Patients Only) Modified Rankin (Stroke Patients Only) Pre-Morbid Rankin Score: Severe disability Modified Rankin: Severe disability     Balance                                              Pertinent Vitals/Pain Pain Assessment: Faces Faces Pain Scale: Hurts little more Pain Location: Legs with ROM Pain Descriptors / Indicators: Grimacing;Guarding Pain Intervention(s): Monitored during session;Limited activity within patient's tolerance;Repositioned    Home Living Family/patient expects to be discharged to:: Private residence Living Arrangements: Children Available Help at Discharge: Other (Comment)(mostly left up to pt's dtr.)                  Prior Function Level of Independence: Needs assistance   Gait / Transfers Assistance Needed: unable to walk, depedent on dtr for transfers to chair or BSC  ADL's / Homemaking Assistance Needed: dependent for all ADL's        Hand Dominance   Dominant Hand: Right    Extremity/Trunk Assessment   Upper Extremity Assessment Upper Extremity Assessment: Defer to OT evaluation    Lower Extremity Assessment Lower Extremity Assessment: Generalized weakness;RLE deficits/detail;LLE deficits/detail RLE Deficits / Details: painful to touch, full extension, but knee tight at 30* PROM RLE Coordination: decreased gross motor;decreased fine motor LLE Deficits / Details: paint below knee, full passive ROM with pain.  no active assist LLE Coordination: decreased fine motor;decreased gross motor       Communication  Cognition Arousal/Alertness: Lethargic Behavior During Therapy: Flat affect Overall Cognitive Status: Impaired/Different from baseline                                        General Comments General comments (skin integrity, edema, etc.): pt bed bound except total lift to chair or BSC from her daughter at times.  Some pressure relief, positional information and handling assistance give to make it easier to care for pt, but pt's daughter was resigned to having to lift pt OOB bodily without lifting equipment due to house/space issues and time  requirements.    Exercises     Assessment/Plan    PT Assessment Patent does not need any further PT services  PT Problem List         PT Treatment Interventions      PT Goals (Current goals can be found in the Care Plan section)  Acute Rehab PT Goals PT Goal Formulation: All assessment and education complete, DC therapy    Frequency     Barriers to discharge        Co-evaluation               AM-PAC PT "6 Clicks" Daily Activity  Outcome Measure Difficulty turning over in bed (including adjusting bedclothes, sheets and blankets)?: Unable Difficulty moving from lying on back to sitting on the side of the bed? : Unable Difficulty sitting down on and standing up from a chair with arms (e.g., wheelchair, bedside commode, etc,.)?: Unable Help needed moving to and from a bed to chair (including a wheelchair)?: Total Help needed walking in hospital room?: Total Help needed climbing 3-5 steps with a railing? : Total 6 Click Score: 6    End of Session   Activity Tolerance: Patient limited by pain Patient left: in bed;with call bell/phone within reach;with bed alarm set;with family/visitor present Nurse Communication: Mobility status PT Visit Diagnosis: Adult, failure to thrive (R62.7);Other symptoms and signs involving the nervous system (R29.898)    Time: 1740-1810 PT Time Calculation (min) (ACUTE ONLY): 30 min   Charges:   PT Evaluation $PT Eval Moderate Complexity: 1 Mod PT Treatments $Self Care/Home Management: 8-22        06/21/2018  Donnella Sham, PT Acute Rehabilitation Services [phone]  (pager) [phone]  (office)  Tessie Fass Deonne Rooks 06/21/2018, 7:09 PM

## 2018-06-21 NOTE — Progress Notes (Signed)
  Echocardiogram 2D Echocardiogram has been performed.  [name] 06/21/2018, 3:38 PM

## 2018-06-21 NOTE — Progress Notes (Signed)
Pt AF RVR 150-170 for about ten min. Provider contacted, orders received.

## 2018-06-21 NOTE — Evaluation (Addendum)
Clinical/Bedside Swallow Evaluation Patient Details  Name: Shelley Cameron MRN: [id number] Date of Birth: 04-09-1930  Today's Date: 06/21/2018 Time: SLP Start Time (ACUTE ONLY): 1314 SLP Stop Time (ACUTE ONLY): 1335 SLP Time Calculation (min) (ACUTE ONLY): 21 min  Past Medical History:  Past Medical History:  Diagnosis Date  . A-fib ([place])   . Anemia   . Arthritis   . Cancer ([place]) 1998, 1999   breast cancer. Bilateral mastectomy.   . Diabetes mellitus without complication ([place])   . DJD (degenerative joint disease)   . Gout   . Heart murmur   . Hyperlipidemia   . Hypertension   . Lymphedema of right arm   . Moderate dementia ([place])   . Seizures ([place])    Tonic-Clonic seizure activity  . Severe mitral insufficiency   . Stroke [facility]) 2011  . Syncope   . Systolic CHF ([place])   . Urinary incontinence    Past Surgical History:  Past Surgical History:  Procedure Laterality Date  . BREAST SURGERY Left 08/1997   Dr Smith/mastectomy  . CATARACT EXTRACTION  2000  . EYE SURGERY Bilateral    Cataract Extraction with IOL  . IVC FILTER INSERTION N/A 03/26/2018   Procedure: IVC FILTER INSERTION;  Surgeon: Katha Cabal, MD;  Location: [facility] CV LAB;  Service: Cardiovascular;  Laterality: N/A;  . MASTECTOMY Right 02/19/1997   Dr Bary Castilla  . MASTECTOMY Left 1999   Dr. Rochel Brome, [facility]  . PACEMAKER INSERTION Left 08/01/2016   Procedure: INSERTION PACEMAKER;  Surgeon: Isaias Cowman, MD;  Location: [facility] ORS;  Service: Cardiovascular;  Laterality: Left;   HPI:  84 y.o. female  PMH  Breast cancer s/p double mastectomy, a fib ( not on anticoagulation), seizure, stroke ( 2011), HTN, DM, traumatic SAH, bilateral DVTs, PE who presented to [facility] hospital for c/o left sided weakness, numbness, facial droop and confusion. Patient transferred to [facility] 4N s/p TPA. Initial CT imaging negative for acute findings, though infarcts in the pons, largely chronic in appearance, old bilateral  MCA infarcts noted.  Assessment / Plan / Recommendation Clinical Impression   Patient presents with suspected primary oral dysphagia secondary to cognitive impairments. She has advanced dementia at baseline, but per daughter's report was able to self-feed. She sometimes coughed at meals when she eats large bites; she worked with SLP at home and was consuming regular diet/thin liquids prior to admission. Today she is awake for my assessment though intermittently drowsy, requiring verbal cues to maintain arousal/attention. She is oriented to self only. Total assistance required for feeding. Initial swallows of thin liquid were timely, and there were no overt signs of aspiration. Occasionally, pt holds liquids orally, requiring verbal/tactile cue to swallow. She had strong immediate cough x1 with consecutive swallows of thin liquid after prolonged oral holding, suspect due to premature spillage/discoordination. There were no overt signs of aspiration with single sips. Purees were also well-tolerated; small sips of thin liquid aided transit/clearance when oral holding occurred. Recommend initiating dys 1, thin liquids, meds crushed in puree. Full supervision and assistance for feeding; feed only when alert. SLP will follow for tolerance and diet progression.    SLP Visit Diagnosis: Dysphagia, unspecified (R13.10)    Aspiration Risk  Moderate aspiration risk    Diet Recommendation Dysphagia 1 (Puree);Thin liquid   Liquid Administration via: Cup;Straw Medication Administration: Crushed with puree Supervision: Full supervision/cueing for compensatory strategies;Staff to assist with self feeding Compensations: Slow rate;Small sips/bites;Minimize environmental distractions;Follow solids with liquid  Other  Recommendations Oral Care Recommendations: Oral care BID   Follow up Recommendations Other (comment)(tbd)      Frequency and Duration min 2x/week  2 weeks       Prognosis Prognosis for Safe  Diet Advancement: Good Barriers to Reach Goals: Cognitive deficits      Swallow Study   General HPI: 84 y.o. female  PMH  Breast cancer s/p double mastectomy, a fib ( not on anticoagulation), seizure, stroke ( 2011), HTN, DM, traumatic SAH, bilateral DVTs, PE who presented to [place] for c/o left sided weakness, numbness, facial droop and confusion. Patient transferred to [facility] 4N s/p TPA. Type of Study: Bedside Swallow Evaluation Previous Swallow Assessment: none in chart; family reports pt saw [facility] SLP and was consuming regular diet/thin liquids Diet Prior to this Study: NPO Temperature Spikes Noted: No Respiratory Status: Room air History of Recent Intubation: No Behavior/Cognition: Confused;Lethargic/Drowsy;Requires cueing Oral Cavity Assessment: Within Functional Limits Oral Care Completed by SLP: Yes Oral Cavity - Dentition: Dentures, top Self-Feeding Abilities: Total assist Patient Positioning: Upright in bed Baseline Vocal Quality: Low vocal intensity Volitional Cough: Cognitively unable to elicit Volitional Swallow: Able to elicit    Oral/Motor/Sensory Function Overall Oral Motor/Sensory Function: Generalized oral weakness Facial Symmetry: Abnormal symmetry left   Ice Chips Ice chips: Within functional limits Presentation: Spoon   Thin Liquid Thin Liquid: Impaired Presentation: Cup;Straw;Spoon Oral Phase Impairments: Poor awareness of bolus Oral Phase Functional Implications: Oral holding;Prolonged oral transit Pharyngeal  Phase Impairments: Cough - Immediate(1/3 trials with consecutive swallows of thin)    Nectar Thick Nectar Thick Liquid: Not tested   Honey Thick Honey Thick Liquid: Not tested   Puree Puree: Impaired Oral Phase Impairments: Poor awareness of bolus;Reduced lingual movement/coordination Oral Phase Functional Implications: Prolonged oral transit;Oral holding Pharyngeal Phase Impairments: Other (comments)(none)   Solid     Solid: Impaired Oral  Phase Impairments: Impaired mastication;Poor awareness of bolus Oral Phase Functional Implications: Impaired mastication;Prolonged oral transit;Oral residue     Deneise Lever, MS, [place] Pager: [phone] Office: [phone]  Aliene Altes 06/21/2018,1:52 PM

## 2018-06-21 NOTE — Evaluation (Signed)
Speech Language Pathology Evaluation Patient Details Name: Shelley Cameron MRN: [id number] DOB: 1930/02/16 Today's Date: 06/21/2018 Time: 6644-0347 SLP Time Calculation (min) (ACUTE ONLY): 13 min  Problem List:  Patient Active Problem List   Diagnosis Date Noted  . Pressure injury of skin 06/21/2018  . Stroke ([place]) 06/20/2018  . DVT (deep vein thrombosis) in pregnancy 03/25/2018  . Closed head injury without concussion 02/24/2018  . Anemia, unspecified 02/23/2018  . Arthritis 02/23/2018  . Congestive heart failure with left ventricular systolic dysfunction ([place]) 02/23/2018  . Diabetes mellitus type 2, uncomplicated ([place]) 42/59/5638  . Gout 02/23/2018  . Cause of injury, fall 02/13/2018  . Cardiac syncope 11/26/2017  . Sick sinus syndrome ([place]) 08/01/2016  . Chest wall pain 05/30/2016  . B12 deficiency 04/25/2016  . Edema of right lower extremity 01/10/2016  . Chronic a-fib 09/07/2014   Past Medical History:  Past Medical History:  Diagnosis Date  . A-fib ([place])   . Anemia   . Arthritis   . Cancer ([place]) 1998, 1999   breast cancer. Bilateral mastectomy.   . Diabetes mellitus without complication ([place])   . DJD (degenerative joint disease)   . Gout   . Heart murmur   . Hyperlipidemia   . Hypertension   . Lymphedema of right arm   . Moderate dementia ([place])   . Seizures ([place])    Tonic-Clonic seizure activity  . Severe mitral insufficiency   . Stroke [facility]) 2011  . Syncope   . Systolic CHF ([place])   . Urinary incontinence    Past Surgical History:  Past Surgical History:  Procedure Laterality Date  . BREAST SURGERY Left 08/1997   Dr Smith/mastectomy  . CATARACT EXTRACTION  2000  . EYE SURGERY Bilateral    Cataract Extraction with IOL  . IVC FILTER INSERTION N/A 03/26/2018   Procedure: IVC FILTER INSERTION;  Surgeon: Katha Cabal, MD;  Location: [facility] CV LAB;  Service: Cardiovascular;  Laterality: N/A;  . MASTECTOMY Right 02/19/1997   Dr Bary Castilla  .  MASTECTOMY Left 1999   Dr. Rochel Brome, [facility]  . PACEMAKER INSERTION Left 08/01/2016   Procedure: INSERTION PACEMAKER;  Surgeon: Isaias Cowman, MD;  Location: [facility] ORS;  Service: Cardiovascular;  Laterality: Left;   HPI:  84 y.o. female  PMH  Breast cancer s/p double mastectomy, a fib ( not on anticoagulation), seizure, stroke ( 2011), HTN, DM, traumatic SAH, bilateral DVTs, PE who presented to [facility] hospital for c/o left sided weakness, numbness, facial droop and confusion. Patient transferred to [facility] 4N s/p TPA. Initial CT imaging negative for acute findings, though infarcts in the pons, largely chronic in appearance, old bilateral MCA infarcts noted.   Assessment / Plan / Recommendation Clinical Impression   Patient presents with chronic severe cognitive deficits due to advanced dementia, which are worsened from baseline. Daughter reports pt recognition/recall of family members is inconsistent at baseline, and she requires assistance for bathing and dressing but is able to self-feed. She is usually able to state her birth date, but is unable to do so today. Today she follows basic, one-step commands >50% of the time with max cues. Verbal output minimal, limited to single words; daughter reports this is not her baseline. Skilled ST recommended to facilitate functional communication during acute stay.      SLP Assessment  SLP Recommendation/Assessment: Patient needs continued Speech Lanaguage Pathology Services SLP Visit Diagnosis: Cognitive communication deficit (R41.841)    Follow Up Recommendations  Other (comment)(tbd)    Frequency  and Duration min 2x/week  1 week      SLP Evaluation Cognition  Overall Cognitive Status: Impaired/Different from baseline Arousal/Alertness: Awake/alert(awake, but drowsy) Orientation Level: Oriented to person Attention: Focused;Sustained Focused Attention: Appears intact Sustained Attention: Impaired Sustained Attention Impairment: Verbal  basic;Functional basic Memory: Impaired Memory Impairment: Storage deficit;Retrieval deficit;Decreased recall of new information;Decreased long term memory;Prospective memory;Decreased short term memory Decreased Long Term Memory: Verbal basic;Functional basic Decreased Short Term Memory: Verbal basic;Functional basic Awareness: Impaired Awareness Impairment: Intellectual impairment;Emergent impairment;Anticipatory impairment Problem Solving: (did not assess, suspect all higher-level functions impaired ) Executive Function: (did not assess, suspect all higher-level functions impaired ) Safety/Judgment: Impaired       Comprehension  Auditory Comprehension Overall Auditory Comprehension: Impaired Yes/No Questions: Impaired Basic Biographical Questions: 51-75% accurate Complex Questions: 0-24% accurate Commands: Impaired One Step Basic Commands: 25-49% accurate(with max visual cues) Conversation: Simple Visual Recognition/Discrimination Discrimination: Not tested Reading Comprehension Reading Status: Not tested    Expression Expression Primary Mode of Expression: Verbal Verbal Expression Overall Verbal Expression: Impaired Initiation: Impaired Automatic Speech: Name;Social Response Level of Generative/Spontaneous Verbalization: Word Repetition: Impaired Level of Impairment: Word level Naming: Not tested Pragmatics: Impairment Impairments: Eye contact Interfering Components: Attention Written Expression Dominant Hand: Right Written Expression: Not tested   Oral / Motor  Oral Motor/Sensory Function Overall Oral Motor/Sensory Function: Generalized oral weakness Facial Symmetry: Abnormal symmetry left Motor Speech Overall Motor Speech: Impaired Respiration: Within functional limits Phonation: Low vocal intensity Resonance: Within functional limits Articulation: Impaired Level of Impairment: Word Intelligibility: Intelligibility reduced Word: 75-100% accurate Phrase:  Not tested Sentence: Not tested   [place], [place], [place] Pager: [phone] Office: [phone]  Aliene Altes 06/21/2018, 2:15 PM

## 2018-06-22 ENCOUNTER — Encounter (HOSPITAL_COMMUNITY): Payer: Medicare HMO

## 2018-06-22 ENCOUNTER — Inpatient Hospital Stay (HOSPITAL_COMMUNITY): Payer: Medicare HMO

## 2018-06-22 LAB — CBC
HEMATOCRIT: 32.1 % — AB (ref 36.0–46.0)
Hemoglobin: 9.6 g/dL — ABNORMAL LOW (ref 12.0–15.0)
MCH: 24.5 pg — ABNORMAL LOW (ref 26.0–34.0)
MCHC: 29.9 g/dL — ABNORMAL LOW (ref 30.0–36.0)
MCV: 81.9 fL (ref 80.0–100.0)
NRBC: 0 % (ref 0.0–0.2)
PLATELETS: 216 10*3/uL (ref 150–400)
RBC: 3.92 MIL/uL (ref 3.87–5.11)
RDW: 17.2 % — AB (ref 11.5–15.5)
WBC: 13 10*3/uL — AB (ref 4.0–10.5)

## 2018-06-22 LAB — BASIC METABOLIC PANEL
Anion gap: 12 (ref 5–15)
BUN: 8 mg/dL (ref 8–23)
CO2: 23 mmol/L (ref 22–32)
CREATININE: 1 mg/dL (ref 0.44–1.00)
Calcium: 8.4 mg/dL — ABNORMAL LOW (ref 8.9–10.3)
Chloride: 108 mmol/L (ref 98–111)
GFR, EST AFRICAN AMERICAN: 58 mL/min — AB (ref >60)
GFR, EST NON AFRICAN AMERICAN: 50 mL/min — AB (ref >60)
Glucose, Bld: 92 mg/dL (ref 70–99)
Potassium: 4.6 mmol/L (ref 3.5–5.1)
SODIUM: 143 mmol/L (ref 135–145)

## 2018-06-22 LAB — URINALYSIS, COMPLETE (UACMP) WITH MICROSCOPIC
Bilirubin Urine: NEGATIVE
GLUCOSE, UA: NEGATIVE mg/dL
Ketones, ur: NEGATIVE mg/dL
NITRITE: POSITIVE — AB
PH: 7 (ref 5.0–8.0)
Protein, ur: 100 mg/dL — AB
SPECIFIC GRAVITY, URINE: 1.013 (ref 1.005–1.030)
WBC, UA: >50 WBC/hpf — ABNORMAL HIGH (ref 0–5)

## 2018-06-22 MED ORDER — ASPIRIN EC 81 MG PO TBEC
81.0000 mg | DELAYED_RELEASE_TABLET | Freq: Every day | ORAL | Status: DC
  Administered 2018-06-23 – 2018-06-24 (×2): 81 mg via ORAL
  Filled 2018-06-22 (×2): qty 1

## 2018-06-22 MED ORDER — SODIUM CHLORIDE 0.9 % IV SOLN
750.0000 mg | Freq: Two times a day (BID) | INTRAVENOUS | Status: DC
  Administered 2018-06-22 – 2018-06-23 (×2): 750 mg via INTRAVENOUS
  Filled 2018-06-22 (×3): qty 7.5

## 2018-06-22 MED ORDER — CEFTRIAXONE SODIUM 1 G IJ SOLR
1.0000 g | INTRAMUSCULAR | Status: DC
  Filled 2018-06-22: qty 10

## 2018-06-22 MED ORDER — SODIUM CHLORIDE 0.9 % IV SOLN
1.0000 g | INTRAVENOUS | Status: DC
  Administered 2018-06-22 – 2018-06-23 (×2): 1 g via INTRAVENOUS
  Filled 2018-06-22 (×3): qty 10

## 2018-06-22 NOTE — Progress Notes (Signed)
OT Treatment Note  Caregiver/daughter educated on Prevalon boots for skin protection. Keep Prevalon boots on while in bed. Daughter will need further education on positioning. Will continue to follow acutely.     06/22/18 1500  OT Visit Information  Last OT Received On 06/22/18  Assistance Needed +2  History of Present Illness 84 y.o. female  PMH  Breast cancer s/p double mastectomy, a fib ( not on anticoagulation), seizure, stroke ( 2011), HTN, DM, traumatic SAH, bilateral DVTs, PE who presented to [facility] hospital for c/o left sided weakness, numbness, facial droop and confusion. Patient transferred to [facility] 4N s/p TPA. Initial CT imaging negative for acute findings, though infarcts in the pons, largely chronic in appearance, old bilateral MCA infarcts noted. CT 10/27 new area of hypodensity in R insula.  Precautions  Precautions Fall  Precaution Comments at risk for skin [place]  Pain Assessment  Pain Assessment Faces  Faces Pain Scale 4  Pain Location Legs with ROM  Pain Descriptors / Indicators Grimacing;Guarding;Moaning  Pain Intervention(s) Limited activity within patient's tolerance  Cognition  Arousal/Alertness Lethargic  OT - End of Session  Activity Tolerance Patient limited by lethargy  Patient left in bed;in CPM;with family/visitor present  Nurse Communication Mobility status;Other (comment) (use of Prevalon boots)  OT Assessment/Plan  OT Plan Discharge plan remains appropriate  OT Visit Diagnosis Other abnormalities of gait and mobility (R26.89);Other symptoms and signs involving cognitive function;Pain;Muscle weakness (generalized) (M62.81)  Pain - part of body  (legs)  OT Frequency (ACUTE ONLY) Min 2X/week  Recommendations for Other Services Other (comment)  Follow Up Recommendations Supervision/Assistance - 24 hour;Other (comment)  OT Equipment Other (comment) (hoyer)  AM-PAC OT "6 Clicks" Daily Activity Outcome Measure  Help from another person eating meals? 1   Help from another person taking care of personal grooming? 1  Help from another person toileting, which includes using toliet, bedpan, or urinal? 1  Help from another person bathing (including washing, rinsing, drying)? 1  Help from another person to put on and taking off regular upper body clothing? 1  Help from another person to put on and taking off regular lower body clothing? 1  6 Click Score 6  ADL G Code Conversion CN  OT Goal Progression  Progress towards OT goals Progressing toward goals  Acute Rehab OT Goals  Patient Stated Goal per daughter to get some help with her care at home  OT Goal Formulation With family  Time For Goal Achievement 07/06/18  Potential to Achieve Goals Fair  ADL Goals  Pt Will Perform Eating with mod assist;with adaptive utensils;sitting  Additional ADL Goal #1 Caregiver will demonstrate ability to donn/doff prevalon boots to improve heel integrity.  OT Time Calculation  OT Start Time (ACUTE ONLY) 1429  OT Stop Time (ACUTE ONLY) 1439  OT Time Calculation (min) 10 min  OT General Charges  $OT Visit 1 Visit  OT Treatments  $Therapeutic Activity 8-22 mins  Maurie Boettcher, OT/L   Acute OT Clinical Specialist [place] Pager [phone] Office [phone]

## 2018-06-22 NOTE — Progress Notes (Signed)
STROKE TEAM PROGRESS NOTE  SUBJECTIVE (INTERVAL HISTORY) Her family is at bedside. Per family when she woke up fine but then became flaccid on the left side with dysarthria and facial droop. No seizure-like activity. At baseline she is mostly bed bound.  Patient was on Xarelto in the past which was stopped due to fall and hemorrhage. She has not been on anti-platelet medication at home. She lives with her daughter who is at bedside. Patient is improved today, speech is better, she is lethargic at the moment due to being tired.  She is lethargic today however per daughtre "given something to sleep". No focal changes today, she is more lethargic however may be due to Seroquel given last evening. Pulse and blood pressure stable. Will order infectious workup and repeat CT head. Last night given seroquel to sleep this is on her regular med list.   HISTORY OF PRESENT ILLNESS (per record) Shelley Cameron is an 84 y.o. female PMH  Breast cancer s/p double mastectomy, a fib ( not on anticoagulation), seizure, stroke ( 2011), HTN, DM, traumatic SAH, dementia, bilateral DVTs, PE who presented to [facility] hospital for c/o left sided weakness, numbness, facial droop and confusion. Patient transferred to [facility] 4N s/p TPA.  Per family patient went to bed last night about 0800 and woke up this morning in her usual state of health. At about 0945 am she started to notice some drooling, slurred speech, and her whole left side was flaccid. Denies any c/o HA, vision changes, or dizziness.  No seizure like activity was noted. Patient does have a history of seizures. Was taken to hospital 06/15/18 for seizure, but it was determined to be syncopal episode.  At baseline patient is able to feed herself. She does not bathe or dress herself and per daughter she is mostly bed bound.  Hospital course CTA: no hemorrhage BG: 89 BP: 134/114 Currently in a fib   Stroke 2011 no residual deficits   Date last known well: Date:  06/20/2018 Time last known well: Time: 09:45 tPA Given: Yes; bolus started @ 1150 @ [facility] Modified Rankin: Rankin Score=4 Initial NIHSS: 17 NIHSS post tpa: 8    OBJECTIVE Vitals:   06/22/18 0800 06/22/18 1035 06/22/18 1200 06/22/18 1235  BP: 124/78 (!) 158/80  132/63  Pulse: 73 95  80  Resp: 18 16  13   Temp: 98 F (36.7 C)  98.4 F (36.9 C)   TempSrc: Oral  Axillary   SpO2: 100% 100%  100%    CBC:  Recent Labs  Lab 06/15/18 1529 06/20/18 1207 06/22/18 0118  WBC 11.9* 7.9 13.0*  NEUTROABS 6.0 5.4  --   HGB 10.5* 11.1* 9.6*  HCT 33.5* 34.8* 32.1*  MCV 81.1 80.4 81.9  PLT 261 265 409    Basic Metabolic Panel:  Recent Labs  Lab 06/20/18 1207 06/22/18 0118  NA 141 143  K 3.1* 4.6  CL 102 108  CO2 28 23  GLUCOSE 89 92  BUN 8 8  CREATININE 0.93 1.00  CALCIUM 8.7* 8.4*    Lipid Panel:     Component Value Date/Time   CHOL 130 06/21/2018 0627   TRIG 84 06/21/2018 0627   HDL 51 06/21/2018 0627   CHOLHDL 2.5 06/21/2018 0627   VLDL 17 06/21/2018 0627   LDLCALC 62 06/21/2018 0627   HgbA1c:  Lab Results  Component Value Date   HGBA1C 5.8 (H) 06/21/2018   Urine Drug Screen: No results found for: LABOPIA, [place], [place], [place], [facility],  LABBARB  Alcohol Level     Component Value Date/Time   ETH <10 06/20/2018 1207    IMAGING   Ct Head Wo Contrast 06/21/2018 IMPRESSION:  1. No acute intracranial process.  2. Old bilateral MCA territory infarcts.  3. Severe chronic small vessel ischemic changes and old lacunar infarcts.  4. Severe atrophy most conspicuous within mesial temporal lobes associated with neuro degenerative syndromes.    Dg Chest Portable 1 View 06/20/2018 IMPRESSION:  No acute cardiopulmonary findings.     Ct Head Code Stroke Wo Contrast 06/20/2018 IMPRESSION:  1. No intracranial hemorrhage or definite acute infarct.  2. New infarcts in the pons, largely chronic in appearance though a superimposed acute or subacute infarct  is not excluded.  3. ASPECTS is 10.  4. Extensive cerebral atrophy and chronic small vessel ischemic disease with old infarcts as above.     CT head 06/22/2018: IMPRESSION: 1. New area of hypodensity in the right insula since 06/20/2018 compatible with recent small infarct. No hemorrhage or mass effect. 2. Elsewhere continued stable noncontrast CT appearance of the brain.  Carotid Dopplers - No pfo or thrombus    Transthoracic Echocardiogram  00/00/00 Study Conclusions  - Left ventricle: The cavity size was normal. Wall thickness was   increased in a pattern of moderate LVH. Systolic function was   normal. The estimated ejection fraction was in the range of 50%   to 55%. The study is not technically sufficient to allow   evaluation of LV diastolic function. - Aortic valve: Mildly calcified annulus. Mildly thickened   leaflets. There was mild regurgitation. Valve area (VTI): 2.29   cm^2. Valve area (Vmean): 1.98 cm^2. - Mitral valve: Mildly calcified annulus. Normal thickness leaflets   . There was mild regurgitation. - Atrial septum: No defect or patent foramen ovale was identified. - Technically difficult study.    PHYSICAL EXAM Blood pressure 132/63, pulse 80, temperature 98.4 F (36.9 C), temperature source Axillary, resp. rate 13, SpO2 100 %.  PHYSICAL EXAM Physical exam: Exam: Gen: NAD Eyes: anicteric sclerae, moist conjunctivae                    CV: no MRG, no carotid bruits, no peripheral edema Mental Status: she is very lethargic, is arousable and answers some questions(name) and follows simple commands but falls right back to sleep.  Neuro: Detailed Neurologic Exam  Speech:    +Dysarthria, patient is also very lethargic currently, can open eyes to voice then goes back to sleep  Cranial Nerves:    The pupils are equal, round, and reactive to light.. Attempted, Fundi not visualized due to noncooperation.  EOMI. Right gaze preference can cross the mdline.  Blinks on the right not on the left. Left lower facial droop, , Tongue midline. Hearing intact to voice. Shoulder shrug intact, tongue and uvula midline  Motor Observation:    no involuntary movements noted. Tone appears normal    Strength:    Difficult exam due to lethargy, she can move all her extremities anti-gravity today more lethargic with some bilat drift however nothing new focally, appears to be weaker on the left however     Sensation:  Withdraws x 4 and grimaces to pain with vocalizations   Plantars equiv  Reflexes: symmetric.     ASSESSMENT/PLAN Shelley Cameron is a 84 y.o. female with history of breast cancer s/p double mastectomy, a fib ( not on anticoagulation), PPM, seizure, stroke ( 2011), HTN, DM, traumatic SAH,  dementia, bilateral DVTs, and PE (IVC filter) presenting with left side flacid, numbness, facial droop, drooling, slurred speech and confusion. tPA Given: Yes; bolus started @ 1150 @ [facility] Friday 06/20/2018  Suspected stroke treated with IV tPA:  CT head 1027 with hypodensity in the right insula since 06/20/2018, cannot have MRI due to pacemaker  Resultant  Left lower facial droop, dysarthria, left hemiparesis. Improving but today lethargic likely tired from admission  CT head - hypodensity in the right insula since 06/20/2018  MRI head - Pacemaker  MRA head - Pacemaker   Carotid Dopplers - Pending  Carotid Doppler - pending (cannot get IV access, so did not order CTA head and neck)  2D Echo  - EF 50 - 55%. No cardiac source of emboli identified.   LDL - 62  HgbA1c - 5.8  VTE prophylaxis - SCDs  Diet - NPO  No antithrombotic prior to admission, now on ASA suppository 300 mg daily. Discharge on ASA 81mg .  Patient will be counseled to be compliant with her antithrombotic medications  Ongoing aggressive stroke risk factor management  Therapy recommendations:  HH OT  No F/U PT  Disposition:  Pending  Hypertension  Stable . Permissive  hypertension SBP goal < 180 mmHg s/p tPA . Long-term BP goal normotensive  Hyperlipidemia  Lipid lowering medication PTA:  Mevacor 40 mg daily  LDL - 62, goal < 70  Current lipid lowering medication: Pravachol 40 mg daily    Continue statin at discharge  Diabetes  HgbA1c 5.8, goal < 7.0  Controlled  Other Stroke Risk Factors  Advanced age  recommend weight loss, diet and exercise as appropriate   Hx stroke/TIA  Atrial fibrillation not on anticoagulation due to hemorrhage. Not on anti-platelets at home. Start ASA 325 and discharge on ASA 81mg .   Other Active Problems  Seizure Hx - Keppra, Lamictal.   Passed swallow restart home oral medications  Hypokalemia - 3.1 -> supplement - recheck -> 4.6  Mild anemia - 10.5 -> 11.1 -> 9.6  Leukocytosis - 13.0 - afebrile  Has a UTI positive nitrite and large leukocytes - start Rocephin  CXR negative    Plan  Discharge on ASA 81mg   Await carotid dopplers  Treating for UTI pending Urine Culture   Hospital day # 2  Personally examined patient and images, and have participated in and made any corrections needed to history, physical, neuro exam,assessment and plan as stated above.  I have personally obtained the history, evaluated lab date, reviewed imaging studies and agree with radiology interpretations.       To contact Stroke Continuity provider, please refer to [email or web address] After hours, contact General Neurology

## 2018-06-22 NOTE — Progress Notes (Signed)
Occupational Therapy Evaluation Patient Details Name: Shelley Cameron MRN: [id number] DOB: Nov 05, 1929 Today's Date: 06/22/2018    History of Present Illness 84 y.o. female  PMH  Breast cancer s/p double mastectomy, a fib ( not on anticoagulation), seizure, stroke ( 2011), HTN, DM, traumatic SAH, bilateral DVTs, PE who presented to [place] for c/o left sided weakness, numbness, facial droop and confusion. Patient transferred to [facility] 4N s/p TPA. Initial CT imaging negative for acute findings, though infarcts in the pons, largely chronic in appearance, old bilateral MCA infarcts noted. CT 10/27 new area of hypodensity in R insula.   Clinical Impression   PTA, pt lived at home with daughter who provided all care with the exception of pt being able to feed herself. Per daughter, pt has had a gradual decline in function and has been sleeping most of the day. Daughter plans to take her Mom back home. Given new CVA and further decline in function, recommend palliative care consult. Will follow acutely to educate on positioning and self care to reduce burden of care and maintain skin integrity. Recommend B Prevalon boots.     Follow Up Recommendations  Supervision/Assistance - 24 hour;Other (comment)(HH Aide; HHRN); HHOT   Equipment Recommendations  Other (comment)(Hoyer)    Recommendations for Other Services Other (comment)(Palliative consult)     Precautions / Restrictions Precautions Precautions: Fall Precaution Comments: at risk for skin breakdwon Restrictions Weight Bearing Restrictions: No      Mobility Bed Mobility Overal bed mobility: Needs Assistance Bed Mobility: Rolling Rolling: Total assist         General bed mobility comments: dependent during rolling   Transfers                 General transfer comment: NT; will need maximove/ sky lift    Balance                                           ADL either performed or assessed with  clinical judgement   ADL Overall ADL's : Needs assistance/impaired                                       General ADL Comments: total A for ADL; unable to feed self     Vision         Perception     Praxis      Pertinent Vitals/Pain Pain Assessment: Faces Faces Pain Scale: Hurts even more Pain Location: Legs with ROM Pain Descriptors / Indicators: Grimacing;Guarding;Moaning Pain Intervention(s): Limited activity within patient's tolerance     Hand Dominance Right   Extremity/Trunk Assessment Upper Extremity Assessment Upper Extremity Assessment: Generalized weakness   Lower Extremity Assessment Lower Extremity Assessment: Defer to PT evaluation - very painful with BLE ROM RCervical / Trunk Assessment Cervical / Trunk Assessment: Normal   Communication Communication Communication: Expressive difficulties   Cognition Arousal/Alertness: Lethargic Behavior During Therapy: Flat affect Overall Cognitive Status: Impaired/Different from baseline Area of Impairment: Orientation;Attention;Memory;Following commands;Safety/judgement;Awareness;Problem solving                 Orientation Level: Disoriented to;Place;Time;Situation Current Attention Level: Focused Memory: Decreased recall of precautions;Decreased short-term memory Following Commands: Follows one step commands inconsistently Safety/Judgement: Decreased awareness of safety;Decreased awareness of deficits Awareness: Intellectual Problem Solving: Slow processing;Decreased initiation;Difficulty  sequencing;Requires tactile cues;Requires verbal cues     General Comments       Exercises     Shoulder Instructions      Home Living Family/patient expects to be discharged to:: Private residence Living Arrangements: Children Available Help at Discharge: Other (Comment)(mostly left up to pt's dtr.) Type of Home: House Home Access: Stairs to enter [employer] of Steps: 2 Entrance  Stairs-Rails: Can reach both Home Layout: One level     Bathroom Shower/Tub: Occupational psychologist: Handicapped height Bathroom Accessibility: Yes How Accessible: Accessible via walker Home Equipment: [place] - single point;Cane - quad;Walker - standard;Walker - 4 wheels;Grab bars - toilet;Grab bars - tub/shower;Shower seat - built in;Wheelchair - manual      Lives With: Daughter    Prior Functioning/Environment Level of Independence: Needs assistance  Gait / Transfers Assistance Needed: unable to walk, depedent on dtr for transfers to chair or BSC; incontinenet at baseline; pt wears brief ADL's / Homemaking Assistance Needed: dependent for all ADL's although feeds herself   Comments: assistance/dependent  with ADLs, dependent for IADLs. Patient family reports that she has not been walking unless family made her walk in the last couple of months, able to walk a few feet to a chair. last week she did not walk at all.        OT Problem List: Decreased strength;Decreased range of motion;Decreased activity tolerance;Impaired balance (sitting and/or standing);Decreased coordination;Decreased cognition;Decreased safety awareness;Decreased knowledge of use of DME or AE;Impaired sensation;Pain;Increased edema      OT Treatment/Interventions: Self-care/ADL training;Therapeutic exercise;DME and/or AE instruction;Therapeutic activities;Cognitive remediation/compensation;Patient/family education    OT Goals(Current goals can be found in the care plan section) Acute Rehab OT Goals Patient Stated Goal: per daughter to get some help with her care at home OT Goal Formulation: With family Time For Goal Achievement: 07/06/18 Potential to Achieve Goals: Fair  OT Frequency: Min 2X/week   Barriers to D/C:            Co-evaluation              AM-PAC PT "6 Clicks" Daily Activity     Outcome Measure Help from another person eating meals?: Total Help from another person taking care  of personal grooming?: Total Help from another person toileting, which includes using toliet, bedpan, or urinal?: Total Help from another person bathing (including washing, rinsing, drying)?: Total Help from another person to put on and taking off regular upper body clothing?: Total Help from another person to put on and taking off regular lower body clothing?: Total 6 Click Score: 6   End of Session Nurse Communication: Mobility status;Need for lift equipment(sky lift)  Activity Tolerance: Patient limited by lethargy Patient left: in bed;with call bell/phone within reach;with bed alarm set;with family/visitor present  OT Visit Diagnosis: Other abnormalities of gait and mobility (R26.89);Other symptoms and signs involving cognitive function;Pain;Muscle weakness (generalized) (M62.81) Pain - part of body: (legs)                Time: 3790-2409 OT Time Calculation (min): 26 min Charges:  OT General Charges $OT Visit: 1 Visit OT Evaluation $OT Eval Moderate Complexity: 1 Mod OT Treatments $Self Care/Home Management : 8-22 mins  Maurie Boettcher, OT/L   Acute OT Clinical Specialist [place] Pager [phone] Office [phone]   [facility] 06/22/2018, 2:11 PM

## 2018-06-22 NOTE — Progress Notes (Signed)
  Speech Language Pathology Treatment: Dysphagia  Patient Details Name: Shelley Cameron MRN: [id number] DOB: 12-11-1929 Today's Date: 06/22/2018 Time: 3329-5188 SLP Time Calculation (min) (ACUTE ONLY): 11 min  Assessment / Plan / Recommendation Clinical Impression  Pt seen for follow-up for dysphagia; per RN report she has been more lethargic today. Repeat head CT this morning showed new area of hypodensity in the right insula, compatible with recent small infarct. Pt sitting upright, alert when SLP entered, having just finished working with OT. RN administered medication in puree; pt with oral holding. SLP provided tactile, dry spoon cue and pt initiated swallow, with adequate oral clearance. When set up for self-feeding, pt able to hold spoon and retrieve boluses. Less oral holding noted when pt self-feeds vs clinician feeding via spoon, though occasional mod cues necessary to trigger swallow initiation. Pt automaticity improved with straw vs cup sips for thin liquids, though intermittent oral holding persists. Cough x1 after pt consumed larger bolus via straw with prolonged holding. With smaller sips, no overt signs of aspiration. Due to change in level of alertness, did not administer further POs. Teachback provided to daughter re: monitoring for oral holding/swallow initiation, attempting POs only when able to sustain alertness/attention. Would continue to allow small sips of thin liquids, bites of puree with full supervision/assistance. Will follow up.     HPI HPI: 84 y.o. female  PMH  Breast cancer s/p double mastectomy, a fib ( not on anticoagulation), seizure, stroke ( 2011), HTN, DM, traumatic SAH, bilateral DVTs, PE who presented to [place] for c/o left sided weakness, numbness, facial droop and confusion. Patient transferred to [facility] 4N s/p TPA. Initial CT imaging negative for acute findings, though infarcts in the pons, largely chronic in appearance, old bilateral MCA infarcts  noted.      SLP Plan  Continue with current plan of care       Recommendations  Diet recommendations: Thin liquid;Dysphagia 1 (puree) Liquids provided via: Cup;Straw Medication Administration: Crushed with puree Supervision: Full supervision/cueing for compensatory strategies;Staff to assist with self feeding Compensations: Slow rate;Small sips/bites;Minimize environmental distractions;Follow solids with liquid                Oral Care Recommendations: Oral care BID Follow up Recommendations: Other (comment)(tbd) SLP Visit Diagnosis: Dysphagia, unspecified (R13.10) Plan: Continue with current plan of care       [place], [place], [place] Pager: [phone] Office: [phone]   Aliene Altes 06/22/2018, 2:22 PM

## 2018-06-23 ENCOUNTER — Inpatient Hospital Stay (HOSPITAL_COMMUNITY): Payer: Medicare HMO

## 2018-06-23 DIAGNOSIS — I6389 Other cerebral infarction: Secondary | ICD-10-CM

## 2018-06-23 DIAGNOSIS — I6300 Cerebral infarction due to thrombosis of unspecified precerebral artery: Secondary | ICD-10-CM

## 2018-06-23 LAB — BASIC METABOLIC PANEL
Anion gap: 9 (ref 5–15)
BUN: 7 mg/dL — AB (ref 8–23)
CALCIUM: 8.6 mg/dL — AB (ref 8.9–10.3)
CO2: 27 mmol/L (ref 22–32)
Chloride: 109 mmol/L (ref 98–111)
Creatinine, Ser: 0.9 mg/dL (ref 0.44–1.00)
GFR calc Af Amer: >60 mL/min (ref >60)
GFR, EST NON AFRICAN AMERICAN: 56 mL/min — AB (ref >60)
GLUCOSE: 77 mg/dL (ref 70–99)
Potassium: 3.4 mmol/L — ABNORMAL LOW (ref 3.5–5.1)
SODIUM: 145 mmol/L (ref 135–145)

## 2018-06-23 LAB — CBC
HCT: 31.8 % — ABNORMAL LOW (ref 36.0–46.0)
HEMOGLOBIN: 9.8 g/dL — AB (ref 12.0–15.0)
MCH: 25.1 pg — AB (ref 26.0–34.0)
MCHC: 30.8 g/dL (ref 30.0–36.0)
MCV: 81.5 fL (ref 80.0–100.0)
PLATELETS: 261 10*3/uL (ref 150–400)
RBC: 3.9 MIL/uL (ref 3.87–5.11)
RDW: 17.3 % — AB (ref 11.5–15.5)
WBC: 10.3 10*3/uL (ref 4.0–10.5)
nRBC: 0 % (ref 0.0–0.2)

## 2018-06-23 MED ORDER — VENLAFAXINE HCL ER 37.5 MG PO CP24: 37.5000 mg | ORAL_CAPSULE | Freq: Every day | ORAL | Status: DC

## 2018-06-23 MED ORDER — LEVETIRACETAM 750 MG PO TABS
750.0000 mg | ORAL_TABLET | Freq: Two times a day (BID) | ORAL | Status: DC
  Administered 2018-06-23 – 2018-06-26 (×7): 750 mg via ORAL
  Filled 2018-06-23 (×7): qty 1

## 2018-06-23 MED ORDER — LABETALOL HCL 5 MG/ML IV SOLN
10.0000 mg | INTRAVENOUS | Status: AC | PRN
  Administered 2018-06-23 (×2): 10 mg via INTRAVENOUS
  Filled 2018-06-23 (×2): qty 4

## 2018-06-23 MED ORDER — POLYETHYLENE GLYCOL 3350 17 G PO PACK
17.0000 g | PACK | Freq: Every day | ORAL | Status: DC | PRN
  Administered 2018-06-25 (×2): 17 g via ORAL
  Filled 2018-06-23 (×2): qty 1

## 2018-06-23 MED ORDER — PEG 3350 17 GM/SCOOP PO POWD: 17.0000 g | Freq: Every day | ORAL | Status: DC | PRN

## 2018-06-23 MED ORDER — DONEPEZIL HCL 10 MG PO TABS: 10.0000 mg | ORAL_TABLET | Freq: Every day | ORAL | Status: DC

## 2018-06-23 MED ORDER — VITAMIN B-12 1000 MCG PO TABS: 1000.0000 ug | ORAL_TABLET | Freq: Every day | ORAL | Status: DC

## 2018-06-23 MED ORDER — LAMOTRIGINE 25 MG PO TABS: 25.0000 mg | ORAL_TABLET | Freq: Two times a day (BID) | ORAL | Status: DC

## 2018-06-23 MED ORDER — TRAMADOL HCL 50 MG PO TABS
25.0000 mg | ORAL_TABLET | ORAL | Status: DC | PRN
  Administered 2018-06-23: 25 mg via ORAL
  Filled 2018-06-23 (×2): qty 1

## 2018-06-23 MED ORDER — METOPROLOL TARTRATE 25 MG PO TABS: 12.5000 mg | ORAL_TABLET | Freq: Two times a day (BID) | ORAL | Status: DC

## 2018-06-23 MED ORDER — SENNOSIDES-DOCUSATE SODIUM 8.6-50 MG PO TABS: 1.0000 | ORAL_TABLET | Freq: Every day | ORAL | Status: DC

## 2018-06-23 MED ORDER — PRAVASTATIN SODIUM 20 MG PO TABS: 10.0000 mg | ORAL_TABLET | Freq: Every day | ORAL | Status: DC

## 2018-06-23 MED ORDER — VITAMIN D 1000 UNITS PO TABS
2000.0000 [IU] | ORAL_TABLET | Freq: Every day | ORAL | Status: DC
  Administered 2018-06-24 – 2018-06-26 (×3): 2000 [IU] via ORAL
  Filled 2018-06-23 (×3): qty 2

## 2018-06-23 MED ORDER — APIXABAN 5 MG PO TABS
5.0000 mg | ORAL_TABLET | Freq: Two times a day (BID) | ORAL | Status: DC
  Administered 2018-06-23 – 2018-06-24 (×3): 5 mg via ORAL
  Filled 2018-06-23 (×3): qty 1

## 2018-06-23 MED ORDER — MEMANTINE HCL 5 MG PO TABS: 5.0000 mg | ORAL_TABLET | Freq: Two times a day (BID) | ORAL | Status: DC

## 2018-06-23 MED ORDER — COLCHICINE 0.6 MG PO TABS
0.6000 mg | ORAL_TABLET | Freq: Every day | ORAL | Status: DC | PRN
  Filled 2018-06-23: qty 1

## 2018-06-23 MED ORDER — QUETIAPINE FUMARATE 25 MG PO TABS: 50.0000 mg | ORAL_TABLET | Freq: Every day | ORAL | Status: DC

## 2018-06-23 NOTE — Progress Notes (Signed)
Clarified with Dr. Leonie Man about blood pressure goal. Systolic BP less than 075 verbal.

## 2018-06-23 NOTE — Progress Notes (Signed)
CROSS COVER ON CALL NOTE Pt too drowsy for PO meds per RN. Given labetalol (10mg  IV for 2 doses PRN) D3 of ischemic stroke - will not like BP to be over 160 at this point. Current parameters to keep SBP<180. I think she is past the permissive HTN phase and should now have her pressures normalized. If level of wakefulness remains low, consider repeat CTH at that time.  -- Amie Portland, MD [facility] Pager: [phone] If 7pm to 7am, please call on call as listed on AMION.

## 2018-06-23 NOTE — Progress Notes (Signed)
Patient arrived to the floor alert to self only. C/o pain in her legs. See Mar for intervention. Pt. Placed on tele monitor #8 All questions and concerns addressed. Daughter at bedside. Bed in the lowest position with alarm set. Call light in reach.

## 2018-06-23 NOTE — Progress Notes (Signed)
*  PRELIMINARY RESULTS* Vascular Ultrasound Carotid Duplex (Doppler) has been completed.   Findings suggest 1-39% internal carotid artery stenosis bilaterally. Vertebral arteries are patent with antegrade flow.  06/23/2018 10:17 AM Maudry Mayhew, MHA, RVT, RDCS, RDMS

## 2018-06-23 NOTE — Progress Notes (Signed)
STROKE TEAM PROGRESS NOTE  SUBJECTIVE (INTERVAL HISTORY)   Patient is more alert and interactive today. She is disoriented. She follows simple commands. She has mild left hemiparesis. Blood pressure is adequately controlled.      OBJECTIVE Vitals:   06/23/18 1200 06/23/18 1300 06/23/18 1400 06/23/18 1530  BP: (!) 152/75 (!) 165/63 (!) 148/84 (!) 157/76  Pulse:    84  Resp: (!) 26 (!) 22 (!) 21 18  Temp: 97.9 F (36.6 C)     TempSrc: Oral     SpO2:    99%    CBC:  Recent Labs  Lab 06/20/18 1207 06/22/18 0118 06/23/18 0527  WBC 7.9 13.0* 10.3  NEUTROABS 5.4  --   --   HGB 11.1* 9.6* 9.8*  HCT 34.8* 32.1* 31.8*  MCV 80.4 81.9 81.5  PLT 265 216 144    Basic Metabolic Panel:  Recent Labs  Lab 06/22/18 0118 06/23/18 0527  NA 143 145  K 4.6 3.4*  CL 108 109  CO2 23 27  GLUCOSE 92 77  BUN 8 7*  CREATININE 1.00 0.90  CALCIUM 8.4* 8.6*    Lipid Panel:     Component Value Date/Time   CHOL 130 06/21/2018 0627   TRIG 84 06/21/2018 0627   HDL 51 06/21/2018 0627   CHOLHDL 2.5 06/21/2018 0627   VLDL 17 06/21/2018 0627   LDLCALC 62 06/21/2018 0627   HgbA1c:  Lab Results  Component Value Date   HGBA1C 5.8 (H) 06/21/2018   Urine Drug Screen: No results found for: LABOPIA, COCAINSCRNUR, LABBENZ, AMPHETMU, THCU, LABBARB  Alcohol Level     Component Value Date/Time   ETH <10 06/20/2018 1207    IMAGING   Ct Head Wo Contrast 06/21/2018 IMPRESSION:  1. No acute intracranial process.  2. Old bilateral MCA territory infarcts.  3. Severe chronic small vessel ischemic changes and old lacunar infarcts.  4. Severe atrophy most conspicuous within mesial temporal lobes associated with neuro degenerative syndromes.    Dg Chest Portable 1 View 06/20/2018 IMPRESSION:  No acute cardiopulmonary findings.     Ct Head Code Stroke Wo Contrast 06/20/2018 IMPRESSION:  1. No intracranial hemorrhage or definite acute infarct.  2. New infarcts in the pons, largely  chronic in appearance though a superimposed acute or subacute infarct is not excluded.  3. ASPECTS is 10.  4. Extensive cerebral atrophy and chronic small vessel ischemic disease with old infarcts as above.     CT head 06/22/2018: IMPRESSION: 1. New area of hypodensity in the right insula since 06/20/2018 compatible with recent small infarct. No hemorrhage or mass effect. 2. Elsewhere continued stable noncontrast CT appearance of the brain.  Carotid Dopplers - No pfo or thrombus    Transthoracic Echocardiogram  00/00/00 Study Conclusions  - Left ventricle: The cavity size was normal. Wall thickness was   increased in a pattern of moderate LVH. Systolic function was   normal. The estimated ejection fraction was in the range of 50%   to 55%. The study is not technically sufficient to allow   evaluation of LV diastolic function. - Aortic valve: Mildly calcified annulus. Mildly thickened   leaflets. There was mild regurgitation. Valve area (VTI): 2.29   cm^2. Valve area (Vmean): 1.98 cm^2. - Mitral valve: Mildly calcified annulus. Normal thickness leaflets   . There was mild regurgitation. - Atrial septum: No defect or patent foramen ovale was identified. - Technically difficult study.    PHYSICAL EXAM Blood pressure (!) 157/76, pulse 84,  temperature 97.9 F (36.6 C), temperature source Oral, resp. rate 18, SpO2 99 %.  PHYSICAL EXAM Physical exam: Pleasant elderly African-American lady not in distress. . Afebrile. Head is nontraumatic. Neck is supple without bruit.    Cardiac exam no murmur or gallop. Lungs are clear to auscultation. Distal pulses are well felt.  Neuro: Awake alert and interactive. Follows simple midline and one-step commands. Disoriented. Diminished attention registration and recall.  Speech:    +Dysarthria,   Cranial Nerves:    The pupils are equal, round, and reactive to light.. Attempted, Fundi not visualized due to noncooperation.  EOMI. Right gaze  preference can cross the mdline. Blinks on the right not on the left. Left lower facial droop, , Tongue midline. Hearing intact to voice. Shoulder shrug intact, tongue and uvula midline  Motor Observation:    no involuntary movements noted. Tone appears normal    Strength:   Mild left hemiparesis but able to move against gravity. Poor effort hence cannot do exact strength testing.    Sensation:  Withdraws x 4 and grimaces to pain with vocalizations   Plantars equivocal  Reflexes: symmetric.     ASSESSMENT/PLAN Ms. JANELLA ROGALA is a 84 y.o. female with history of breast cancer s/p double mastectomy, a fib ( not on anticoagulation), PPM, seizure, stroke ( 2011), HTN, DM, traumatic SAH, dementia, bilateral DVTs, and PE (IVC filter) presenting with left side flacid, numbness, facial droop, drooling, slurred speech and confusion. tPA Given: Yes; bolus started @ 1150 @ [facility] Friday 06/20/2018  Suspected stroke treated with IV tPA:  CT head 1027 with hypodensity in the right insula since 06/20/2018, cannot have MRI due to pacemaker  Resultant  Left lower facial droop, dysarthria, left hemiparesis. Improving but today lethargic likely tired from admission  CT head - hypodensity in the right insula since 06/20/2018  MRI head - Pacemaker  MRA head - Pacemaker   Carotid Dopplers - 1-39% internal carotid artery stenosis bilaterally. Vertebral arteries are patent with antegrade flow.Carotid Doppler - pending (cannot get IV access, so did not order CTA head and neck)  2D Echo  - EF 50 - 55%. No cardiac source of emboli identified.   LDL - 62  HgbA1c - 5.8  VTE prophylaxis - SCDs  Diet - NPO  No antithrombotic prior to admission, now on ASA suppository 300 mg daily. Discharge on ASA 81mg .  Patient will be counseled to be compliant with her antithrombotic medications  Ongoing aggressive stroke risk factor management  Therapy recommendations:  HH OT  No F/U PT  Disposition:   Pending  Hypertension  Stable . Permissive hypertension SBP goal < 180 mmHg s/p tPA . Long-term BP goal normotensive  Hyperlipidemia  Lipid lowering medication PTA:  Mevacor 40 mg daily  LDL - 62, goal < 70  Current lipid lowering medication: Pravachol 40 mg daily    Continue statin at discharge  Diabetes  HgbA1c 5.8, goal < 7.0  Controlled  Other Stroke Risk Factors  Advanced age  recommend weight loss, diet and exercise as appropriate   Hx stroke/TIA  Atrial fibrillation not on anticoagulation due to hemorrhage. Not on anti-platelets at home. Start ASA 325 and discharge on ASA 81mg .   Other Active Problems  Seizure Hx - Keppra, Lamictal.   Passed swallow restart home oral medications  Hypokalemia - 3.1 -> supplement - recheck -> 4.6  Mild anemia - 10.5 -> 11.1 -> 9.6  Leukocytosis - 13.0 - afebrile  Has a UTI positive  nitrite and large leukocytes - start Rocephin  CXR negative    Plan  Discharge on ASA 81mg   Answer to the neurology floor bed.  Treating for UTI pending Urine Culture  Transfer to skilled nursing facility over the next few days   Hospital day # 3  Personally examined patient and images, and have participated in and made any corrections needed to history, physical, neuro exam,assessment and plan as stated above.  I have personally obtained the history, evaluated lab date, reviewed imaging studies and agree with radiology interpretations. Greater than 50% time during this 35 minute visit was spent on coordination of care discussion with care team plans and goals of care  Antony Contras, MD Medical Director [facility] Pager: [phone] 06/23/2018 6:23 PM    To contact Stroke Continuity provider, please refer to [email or web address] After hours, contact General Neurology

## 2018-06-23 NOTE — Progress Notes (Signed)
  Speech Language Pathology Treatment: Dysphagia;Cognitive-Linquistic  Patient Details Name: Shelley Cameron MRN: [id number] DOB: 03-Feb-1930 Today's Date: 06/23/2018 Time: 9476-5465 SLP Time Calculation (min) (ACUTE ONLY): 25 min  Assessment / Plan / Recommendation Clinical Impression  Treatment complete during functional self feeding task to maximize participation and also differentially diagnose swallowing abilities. Patient alert today and cooperative. Able to sustain attention to basic familiar task for 20 minutes with moderate verbal and visual cueing. Able to follow basic 1-step directions in the context of this task with 90% accuracy and communication basic needs/wants at the phrase and short sentence level when prompted by clinician. Able to consume pureed solids and thin liquids without overt s/s of aspiration. Oral phase remains moderately prolongued however when clinician encouraged self feeding, automaticity and timing of oral phase improves. Recommend continuation of current diet. Hopeful to ability to advance at least to soft solids if mentation continues to improve. Overall, appears to be tolerating current diet.     HPI HPI: 84 y.o. female  PMH  Breast cancer s/p double mastectomy, a fib ( not on anticoagulation), seizure, stroke ( 2011), HTN, DM, traumatic SAH, bilateral DVTs, PE who presented to [place] for c/o left sided weakness, numbness, facial droop and confusion. Patient transferred to [facility] 4N s/p TPA. Initial CT imaging negative for acute findings, though infarcts in the pons, largely chronic in appearance, old bilateral MCA infarcts noted.      SLP Plan  Continue with current plan of care       Recommendations  Diet recommendations: Dysphagia 1 (puree);Thin liquid Liquids provided via: Cup;Straw Medication Administration: Crushed with puree Supervision: Full supervision/cueing for compensatory strategies;Staff to assist with self feeding Compensations:  Slow rate;Small sips/bites;Minimize environmental distractions;Follow solids with liquid Postural Changes and/or Swallow Maneuvers: Seated upright 90 degrees                Oral Care Recommendations: Oral care BID Follow up Recommendations: (TBD) SLP Visit Diagnosis: Dysphagia, oral phase (R13.11) Plan: Continue with current plan of care       [facility] MA, CCC-SLP        Shelley Cameron Shelley Cameron 06/23/2018, 9:26 AM

## 2018-06-23 NOTE — Progress Notes (Signed)
ANTICOAGULATION CONSULT NOTE - Initial Consult  Pharmacy Consult for apixaban Indication: atrial fibrillation  Allergies  Allergen Reactions  . Codeine Nausea Only    Patient Measurements:    Vital Signs: Temp: 98.3 F (36.8 C) (10/28 0800) Temp Source: Oral (10/28 0800) BP: 139/61 (10/28 1000) Pulse Rate: 64 (10/28 1000)  Labs: Recent Labs    06/20/18 1207 06/22/18 0118 06/23/18 0527  HGB 11.1* 9.6* 9.8*  HCT 34.8* 32.1* 31.8*  PLT 265 216 261  APTT <24*  --   --   LABPROT 12.3  --   --   INR 0.92  --   --   CREATININE 0.93 1.00 0.90  TROPONINI <0.03  --   --     Estimated Creatinine Clearance: 42.5 mL/min (by C-G formula based on SCr of 0.9 mg/dL).   Medical History: Past Medical History:  Diagnosis Date  . A-fib ([place])   . Anemia   . Arthritis   . Cancer ([place]) 1998, 1999   breast cancer. Bilateral mastectomy.   . Diabetes mellitus without complication ([employer])   . DJD (degenerative joint disease)   . Gout   . Heart murmur   . Hyperlipidemia   . Hypertension   . Lymphedema of right arm   . Moderate dementia ([place])   . Seizures ([place])    Tonic-Clonic seizure activity  . Severe mitral insufficiency   . Stroke [facility]) 2011  . Syncope   . Systolic CHF ([place])   . Urinary incontinence     Medications:  Medications Prior to Admission  Medication Sig Dispense Refill Last Dose  . acetaminophen (TYLENOL) 500 MG tablet Take 1,000 mg by mouth every 6 (six) hours as needed for mild pain.   Unknown at PRN  . cetirizine (ZYRTEC) 10 MG tablet Take 10 mg by mouth daily as needed for allergies.    Unknown at PRN  . cholecalciferol (VITAMIN D) 1000 units tablet Take 2 tablets by mouth daily.   06/19/2018 at 0800  . colchicine 0.6 MG tablet Take 0.6 mg by mouth as needed (Gout).    Unknown at PRN  . donepezil (ARICEPT) 10 MG tablet Take 10 mg by mouth at bedtime.    06/19/2018 at 2000  . lamoTRIgine (LAMICTAL) 25 MG tablet Take 25 mg by mouth 2 (two) times daily.     06/19/2018 at 1800  . levETIRAcetam (KEPPRA) 750 MG tablet Take 750 mg by mouth 2 (two) times daily.  1 06/19/2018 at 1800  . loratadine (CLARITIN) 10 MG tablet Take 10 mg by mouth daily as needed for allergies.    Unknown at PRN  . lovastatin (MEVACOR) 40 MG tablet Take 40 mg by mouth at bedtime.   06/19/2018 at 2000  . memantine (NAMENDA) 5 MG tablet Take 5 mg by mouth 2 (two) times daily.  3 06/19/2018 at 1800  . metoprolol tartrate (LOPRESSOR) 25 MG tablet Take 12.5 mg by mouth 2 (two) times daily.  3 06/19/2018 at 1800  . Polyethylene Glycol 3350 (PEG 3350) POWD Take 17 g by mouth daily as needed (constipation).    Unknown at PRN  . QUEtiapine (SEROQUEL) 50 MG tablet Take 50 mg by mouth at bedtime.  0 06/19/2018 at 2000  . senna-docusate (SENOKOT-S) 8.6-50 MG tablet Take 1 tablet by mouth daily.   06/19/2018 at Unknown time  . traMADol (ULTRAM) 50 MG tablet Take 25 mg by mouth every 4 (four) hours as needed for moderate pain.    Unknown at PRN  .  venlafaxine XR (EFFEXOR-XR) 37.5 MG 24 hr capsule Take 37.5 mg by mouth daily.  0 06/19/2018 at 0800  . vitamin B-12 (CYANOCOBALAMIN) 1000 MCG tablet Take 1,000 mcg by mouth daily.   06/19/2018 at 0800    Assessment: 84 y.o. F presents with ischemic stroke. Pt received TPA 10/25 1150 at [facility]. Noted pt on Xarelto in the past for afib but discontinued due to fall/ICH in June 2019. 10/27 head CT shows negative for bleeding.   Pharmacy consulted to start apixaban. D/w Dr. Leonie Man about past h/o falls and intracranial bleeding in the past. He would like to proceed - pt with multiple reasons for anticoagulation.  Goal of Therapy:  Prevention of  Monitor platelets by anticoagulation protocol: Yes   Plan:  Apixaban 5mg  po BID F/u CBC and renal function  Sherlon Handing, PharmD, BCPS Clinical pharmacist  **Pharmacist phone directory can now be found on [email or web address] ([email or web address]).  Listed under [place]. 06/23/2018,10:56 AM

## 2018-06-23 NOTE — Discharge Instructions (Signed)

## 2018-06-24 ENCOUNTER — Encounter (HOSPITAL_COMMUNITY): Payer: Self-pay

## 2018-06-24 ENCOUNTER — Other Ambulatory Visit: Payer: Self-pay

## 2018-06-24 LAB — CBC
HEMATOCRIT: 28.8 % — AB (ref 36.0–46.0)
HEMOGLOBIN: 8.6 g/dL — AB (ref 12.0–15.0)
MCH: 24.4 pg — ABNORMAL LOW (ref 26.0–34.0)
MCHC: 29.9 g/dL — ABNORMAL LOW (ref 30.0–36.0)
MCV: 81.6 fL (ref 80.0–100.0)
PLATELETS: 243 10*3/uL (ref 150–400)
RBC: 3.53 MIL/uL — AB (ref 3.87–5.11)
RDW: 17.2 % — AB (ref 11.5–15.5)
WBC: 7.8 10*3/uL (ref 4.0–10.5)
nRBC: 0 % (ref 0.0–0.2)

## 2018-06-24 LAB — BASIC METABOLIC PANEL
ANION GAP: 8 (ref 5–15)
BUN: 11 mg/dL (ref 8–23)
CHLORIDE: 109 mmol/L (ref 98–111)
CO2: 26 mmol/L (ref 22–32)
Calcium: 8.1 mg/dL — ABNORMAL LOW (ref 8.9–10.3)
Creatinine, Ser: 0.95 mg/dL (ref 0.44–1.00)
GFR calc Af Amer: >60 mL/min (ref >60)
GFR calc non Af Amer: 53 mL/min — ABNORMAL LOW (ref >60)
Glucose, Bld: 99 mg/dL (ref 70–99)
POTASSIUM: 3.3 mmol/L — AB (ref 3.5–5.1)
SODIUM: 143 mmol/L (ref 135–145)

## 2018-06-24 LAB — URINE CULTURE

## 2018-06-24 MED ORDER — METOPROLOL TARTRATE 12.5 MG HALF TABLET
12.5000 mg | ORAL_TABLET | Freq: Two times a day (BID) | ORAL | Status: DC
  Administered 2018-06-24 – 2018-06-26 (×4): 12.5 mg via ORAL
  Filled 2018-06-24 (×5): qty 1

## 2018-06-24 MED ORDER — POTASSIUM CHLORIDE CRYS ER 20 MEQ PO TBCR
20.0000 meq | EXTENDED_RELEASE_TABLET | Freq: Three times a day (TID) | ORAL | Status: AC
  Administered 2018-06-24 (×3): 20 meq via ORAL
  Filled 2018-06-24 (×3): qty 1

## 2018-06-24 MED ORDER — CIPROFLOXACIN HCL 500 MG PO TABS: 250.0000 mg | ORAL_TABLET | Freq: Two times a day (BID) | ORAL | Status: DC

## 2018-06-24 MED ORDER — CIPROFLOXACIN HCL 500 MG PO TABS
250.0000 mg | ORAL_TABLET | Freq: Two times a day (BID) | ORAL | Status: DC
  Administered 2018-06-24 – 2018-06-26 (×4): 250 mg via ORAL
  Filled 2018-06-24 (×4): qty 1

## 2018-06-24 NOTE — Progress Notes (Deleted)
Physical Therapy Treatment Patient Details Name: Shelley Cameron MRN: [id number] DOB: 23-Nov-1929 Today's Date: 06/24/2018    History of Present Illness 84 y.o. female  PMH  Breast cancer s/p double mastectomy, a fib ( not on anticoagulation), seizure, stroke ( 2011), HTN, DM, traumatic SAH, bilateral DVTs, PE who presented to [place] for c/o left sided weakness, numbness, facial droop and confusion. Patient transferred to [facility] 4N s/p TPA. Initial CT imaging negative for acute findings, though infarcts in the pons, largely chronic in appearance, old bilateral MCA infarcts noted. CT 10/27 new area of hypodensity in R insula.    PT Comments    Pt awake and mildly lethargic.  She was dependent in care at bed level.  Pt did sit at EOB without caregiver assist, but would not be safe to give a bath at EOB.  Pt would match well with skilled level of care.  06/24/2018    Follow Up Recommendations  Supervision/Assistance - 24 hour;Other (comment)(pt could benefit from maintenance and restorative services )     Equipment Recommendations  Other (comment)(TBA)    Recommendations for Other Services       Precautions / Restrictions Precautions Precautions: Fall Precaution Comments: at risk for skin breadown    Mobility  Bed Mobility Overal bed mobility: Needs Assistance Bed Mobility: Rolling;Sidelying to Sit;Sit to Supine Rolling: Total assist Sidelying to sit: Total assist Supine to sit: Total assist Sit to supine: Total assist   General bed mobility comments: pt stiffens up and does not assist for any mobility  Transfers                 General transfer comment: NT at this time  Ambulation/Gait             General Gait Details: not able   Stairs             Wheelchair Mobility    Modified Rankin (Stroke Patients Only) Modified Rankin (Stroke Patients Only) Pre-Morbid Rankin Score: Severe disability Modified Rankin: Severe disability      Balance Overall balance assessment: Needs assistance Sitting-balance support: Single extremity supported;Bilateral upper extremity supported Sitting balance-Leahy Scale: Poor Sitting balance - Comments: sat at EOB without assist rocking back and forth in/out of midline, but needed UE's and guarding.                                    Cognition Arousal/Alertness: Awake/alert Behavior During Therapy: Flat affect Overall Cognitive Status: Impaired/Different from baseline Area of Impairment: Orientation;Attention;Memory;Following commands;Safety/judgement;Awareness;Problem solving                 Orientation Level: Disoriented to;Place;Time;Situation Current Attention Level: Focused Memory: Decreased recall of precautions;Decreased short-term memory Following Commands: Follows one step commands inconsistently Safety/Judgement: Decreased awareness of safety;Decreased awareness of deficits Awareness: Intellectual Problem Solving: Slow processing;Decreased initiation;Difficulty sequencing;Requires tactile cues;Requires verbal cues General Comments: pt reports name and daughter correctly on arrival      Exercises      General Comments General comments (skin integrity, edema, etc.): pt has prevalon boots, sacral padding and was newly cleaned up from urinary incontinence before the end of the session.      Pertinent Vitals/Pain Pain Assessment: Faces Faces Pain Scale: Hurts little more Pain Location: Legs with ROM Pain Descriptors / Indicators: Cramping;Moaning Pain Intervention(s): Limited activity within patient's tolerance    Home Living Family/patient expects to be discharged to:: Other (Comment)(daughter  now feels that she can not care for pt and keep her) Living Arrangements: Children                  Prior Function Level of Independence: Needs assistance  Gait / Transfers Assistance Needed: unable to walk, depedent on dtr for transfers to chair or  [facility] ADL's / Homemaking Assistance Needed: dependent for all ADL's although feeds herself Comments: assistance/dependent  with ADLs, dependent for IADLs. Patient family reports that she has not been walking unless family made her walk in the last couple of months, able to walk a few feet to a chair. last week she did not walk at all.   PT Goals (current goals can now be found in the care plan section) Acute Rehab PT Goals PT Goal Formulation: All assessment and education complete, DC therapy    Frequency           PT Plan      Co-evaluation              AM-PAC PT "6 Clicks" Daily Activity  Outcome Measure  Difficulty turning over in bed (including adjusting bedclothes, sheets and blankets)?: Unable Difficulty moving from lying on back to sitting on the side of the bed? : Unable Difficulty sitting down on and standing up from a chair with arms (e.g., wheelchair, bedside commode, etc,.)?: Unable Help needed moving to and from a bed to chair (including a wheelchair)?: Total Help needed walking in hospital room?: Total Help needed climbing 3-5 steps with a railing? : Total 6 Click Score: 6    End of Session   Activity Tolerance: Patient tolerated treatment well;Patient limited by pain Patient left: in bed;with call bell/phone within reach;with family/visitor present Nurse Communication: Mobility status PT Visit Diagnosis: Other symptoms and signs involving the nervous system (R29.898);Dizziness and giddiness (R42)     Time: 4628-6381 PT Time Calculation (min) (ACUTE ONLY): 22 min  Charges:                        06/24/2018  Donnella Sham, PT Acute Rehabilitation Services [phone]  (pager) [phone]  (office)   Tessie Fass Tayari Yankee 06/24/2018, 3:33 PM

## 2018-06-24 NOTE — Care Management Important Message (Signed)
Important Message  Patient Details  Name: Shelley Cameron MRN: [id number] Date of Birth: 12-26-1929   Medicare Important Message Given:  Yes    Anna Livers 06/24/2018, 1:38 PM

## 2018-06-24 NOTE — NC FL2 (Addendum)
[place] LEVEL OF CARE SCREENING TOOL     IDENTIFICATION  Patient Name: Shelley Cameron Birthdate: 03/08/1930 Sex: female Admission Date (Current Location): 06/20/2018  [facility] and [place] Number:  Herbalist and Address:  [facility] [place], [place] [address], [place], [place] [address]      Provider Number: [id number]  Attending Physician Name and Address:  Garvin Fila, MD  Relative Name and Phone Number:       Current Level of Care: Hospital Recommended Level of Care: [place] Prior Approval Number:    Date Approved/Denied:   PASRR Number: [id number]  Discharge Plan: SNF    Current Diagnoses: Patient Active Problem List   Diagnosis Date Noted  . Pressure injury of skin 06/21/2018  . Stroke ([place]) 06/20/2018  . DVT (deep vein thrombosis) in pregnancy 03/25/2018  . Closed head injury without concussion 02/24/2018  . Anemia, unspecified 02/23/2018  . Arthritis 02/23/2018  . Congestive heart failure with left ventricular systolic dysfunction ([place]) 02/23/2018  . Diabetes mellitus type 2, uncomplicated ([place]) 56/21/3086  . Gout 02/23/2018  . Cause of injury, fall 02/13/2018  . Cardiac syncope 11/26/2017  . Sick sinus syndrome ([place]) 08/01/2016  . Chest wall pain 05/30/2016  . B12 deficiency 04/25/2016  . Edema of right lower extremity 01/10/2016  . Chronic a-fib 09/07/2014    Orientation RESPIRATION BLADDER Height & Weight     Self  Normal Incontinent Weight:   Height:     BEHAVIORAL SYMPTOMS/MOOD NEUROLOGICAL BOWEL NUTRITION STATUS      Incontinent Diet(puree solids, thin liquids)  AMBULATORY STATUS COMMUNICATION OF NEEDS Skin   Extensive Assist Verbally PU Stage and Appropriate Care PU Stage 1 Dressing: (unstageable, right heel; gauze dressing)                     Personal Care Assistance Level of Assistance  Bathing, Feeding, Dressing Bathing Assistance: Maximum assistance Feeding  assistance: Maximum assistance Dressing Assistance: Maximum assistance     Functional Limitations Info  Sight, Hearing, Speech Sight Info: Adequate Hearing Info: Adequate Speech Info: Adequate    SPECIAL CARE FACTORS FREQUENCY  PT (By licensed PT), OT (By licensed OT), Speech therapy     PT Frequency: 5x/wk OT Frequency: 5x/wk     Speech Therapy Frequency: 5x/wk      Contractures Contractures Info: Not present    Additional Factors Info  Code Status, Allergies, Psychotropic Code Status Info: DNR Allergies Info: Codeine Psychotropic Info: Aricept 10mg  daily at bed; Lamictal 25mg  2x/day; Seroquel 50mg  daily at bed; Effexor 37.5 mg daily at breakfast         Current Medications (06/24/2018):  This is the current hospital active medication list Current Facility-Administered Medications  Medication Dose Route Frequency Provider Last Rate Last Dose  . 0.9 %  sodium chloride infusion   Intravenous Continuous Garvin Fila, MD 50 mL/hr at 06/23/18 1400    . acetaminophen (TYLENOL) tablet 650 mg  650 mg Oral Q4H PRN Garvin Fila, MD       Or  . acetaminophen (TYLENOL) solution 650 mg  650 mg Per Tube Q4H PRN Garvin Fila, MD       Or  . acetaminophen (TYLENOL) suppository 650 mg  650 mg Rectal Q4H PRN Garvin Fila, MD      . apixaban (ELIQUIS) tablet 5 mg  5 mg Oral BID Franky Macho, RPH   5 mg at 06/24/18 1046  .  aspirin EC tablet 81 mg  81 mg Oral Daily Garvin Fila, MD   81 mg at 06/24/18 1046  . cefTRIAXone (ROCEPHIN) 1 g in sodium chloride 0.9 % 100 mL IVPB  1 g Intravenous Q24H Garvin Fila, MD 200 mL/hr at 06/23/18 1730 1 g at 06/23/18 1730  . chlorhexidine (PERIDEX) 0.12 % solution 15 mL  15 mL Mouth Rinse BID Garvin Fila, MD   15 mL at 06/24/18 1046  . cholecalciferol (VITAMIN D) tablet 2,000 Units  2,000 Units Oral Daily Garvin Fila, MD   2,000 Units at 06/24/18 1047  . colchicine tablet 0.6 mg  0.6 mg Oral Daily PRN Garvin Fila, MD       . donepezil (ARICEPT) tablet 10 mg  10 mg Oral QHS Garvin Fila, MD   10 mg at 06/23/18 2216  . famotidine (PEPCID) IVPB 20 mg premix  20 mg Intravenous Q12H Garvin Fila, MD 100 mL/hr at 06/24/18 1045 20 mg at 06/24/18 1045  . lamoTRIgine (LAMICTAL) tablet 25 mg  25 mg Oral BID Garvin Fila, MD   25 mg at 06/24/18 1046  . levETIRAcetam (KEPPRA) tablet 750 mg  750 mg Oral BID Garvin Fila, MD   750 mg at 06/24/18 1046  . loratadine (CLARITIN) tablet 10 mg  10 mg Oral Daily Garvin Fila, MD   10 mg at 06/24/18 1047  . MEDLINE mouth rinse  15 mL Mouth Rinse q12n4p Garvin Fila, MD   15 mL at 06/24/18 1240  . memantine (NAMENDA) tablet 5 mg  5 mg Oral BID Garvin Fila, MD   5 mg at 06/24/18 1046  . metoprolol tartrate (LOPRESSOR) tablet 25 mg  25 mg Oral BID Garvin Fila, MD   25 mg at 06/24/18 1047  . polyethylene glycol (MIRALAX / GLYCOLAX) packet 17 g  17 g Oral Daily PRN Antony Contras S, MD      . potassium chloride SA (K-DUR,KLOR-CON) CR tablet 20 mEq  20 mEq Oral TID Donzetta Starch, NP   20 mEq at 06/24/18 1239  . pravastatin (PRAVACHOL) tablet 40 mg  40 mg Oral q1800 Garvin Fila, MD   40 mg at 06/23/18 1730  . QUEtiapine (SEROQUEL) tablet 50 mg  50 mg Oral QHS Garvin Fila, MD   50 mg at 06/23/18 2216  . senna-docusate (Senokot-S) tablet 1 tablet  1 tablet Oral QHS PRN Garvin Fila, MD      . traMADol Veatrice Bourbon) tablet 25 mg  25 mg Oral Q4H PRN Garvin Fila, MD   25 mg at 06/23/18 1730  . venlafaxine XR (EFFEXOR-XR) 24 hr capsule 37.5 mg  37.5 mg Oral Q breakfast Garvin Fila, MD   37.5 mg at 06/24/18 1046  . vitamin B-12 (CYANOCOBALAMIN) tablet 1,000 mcg  1,000 mcg Oral Daily Garvin Fila, MD   1,000 mcg at 06/24/18 1046     Discharge Medications: Please see discharge summary for a list of discharge medications.  Relevant Imaging Results:  Relevant Lab Results:   Additional Information SS#: [id number]  Geralynn Ochs, LCSW  I have  personally examined this patient, reviewed notes, independently viewed imaging studies, participated in medical decision making and plan of care.ROS completed by me personally and pertinent positives fully documented  I have made any additions or clarifications directly to the above note. Agree with note above.    Antony Contras, MD Medical Director [facility] Pager: [phone] 06/24/2018 6:14 PM

## 2018-06-24 NOTE — Clinical Social Work Note (Signed)
Clinical Social Work Assessment  Patient Details  Name: Shelley Cameron MRN: [id number] Date of Birth: 1929-12-09  Date of referral:  06/24/18               Reason for consult:  Facility Placement                Permission sought to share information with:  Facility Sport and exercise psychologist, Family Supports Permission granted to share information::  Yes, Verbal Permission Granted  Name::     Diplomatic Services operational officer::  [facility]  Relationship::  Daughter  Contact Information:     Housing/Transportation Living arrangements for the past 2 months:  [place] of Information:  Medical Team, Adult Children Patient Interpreter Needed:  None Criminal Activity/Legal Involvement Pertinent to Current Situation/Hospitalization:  No - Comment as needed Significant Relationships:  Adult Children Lives with:  Self, Adult Children Do you feel safe going back to the place where you live?  Yes Need for family participation in patient care:  Yes (Comment)  Care giving concerns:  Patient from home with daughter, but will benefit from short term rehab at discharge. Patient's daughter needs a little bit more help with patient care at this time, hoping that patient can improve at SNF prior to returning home.   Social Worker assessment / plan:  CSW met with patient and daughter at bedside to discuss SNF placement. Patient's daughter would prefer to try for SNF before home, if possible. Patient's daughter preference for [facility]. CSW sent referral. CSW to follow.  Employment status:  Retired Nurse, adult PT Recommendations:  [place] / Referral to community resources:  [place]  Patient/Family's Response to care:  Patient's daughter agreeable to SNF.  Patient/Family's Understanding of and Emotional Response to Diagnosis, Current Treatment, and Prognosis:  Patient's daughter discussed how she has been wanting some  additional help at home for the patient but knows that care is expensive. Patient's daughter discussed how she would ultimately love to have her placed somewhere long term, but it isn't financially feasible. Patient's daughter hopeful that maybe patient could improve to help with her own care somewhat so that it would be easier.  Emotional Assessment Appearance:  Appears stated age Attitude/Demeanor/Rapport:  Unable to Assess Affect (typically observed):  Unable to Assess Orientation:  Oriented to Self Alcohol / Substance use:  Not Applicable Psych involvement (Current and /or in the community):  No (Comment)  Discharge Needs  Concerns to be addressed:  Care Coordination Readmission within the last 30 days:  No Current discharge risk:  Dependent with Mobility Barriers to Discharge:  Continued Medical Work up, [place], [place] 06/24/2018, 1:43 PM

## 2018-06-24 NOTE — Progress Notes (Signed)
Occupational Therapy Treatment Patient Details Name: Shelley Cameron MRN: [id number] DOB: 13-Jun-1930 Today's Date: 06/24/2018    History of present illness 84 y.o. female  PMH  Breast cancer s/p double mastectomy, a fib ( not on anticoagulation), seizure, stroke ( 2011), HTN, DM, traumatic SAH, bilateral DVTs, PE who presented to [facility] [place] [facility] for c/o left sided weakness, numbness, facial droop and confusion. Patient transferred to [facility] 4N s/p TPA. Initial CT imaging negative for acute findings, though infarcts in the pons, largely chronic in appearance, old bilateral MCA infarcts noted. CT 10/27 new area of hypodensity in R insula.   OT comments  Pt currently requires total (A) for bed mobility and all adls. Per daughter patient sleeps until 11am daily and just finishing breakfast. Daughter reports (A) to self feed and prolonged time to complete meal. Pt tolerated EOB sitting at this time with (A).    Follow Up Recommendations  Supervision/Assistance - 24 hour;Other (comment)    Equipment Recommendations  Other (comment)    Recommendations for Other Services Other (comment)    Precautions / Restrictions Precautions Precautions: Fall Precaution Comments: at risk for skin breadown       Mobility Bed Mobility Overal bed mobility: Needs Assistance Bed Mobility: Rolling;Supine to Sit;Sit to Supine Rolling: Total assist   Supine to sit: Total assist Sit to supine: Total assist   General bed mobility comments: pt requires total (A) with posterior lean with static sitting. pt with pain in bil LE. Pt grimaces with any input to LE  Transfers                 General transfer comment: NT at this time    Balance                                           ADL either performed or assessed with clinical judgement   ADL Overall ADL's : Needs assistance/impaired                                       General ADL Comments: total (A) at  baseline. daughter reports hand over hand (A) to feed today.      Vision       Perception     Praxis      Cognition Arousal/Alertness: Awake/alert Behavior During Therapy: Flat affect Overall Cognitive Status: Impaired/Different from baseline                                 General Comments: pt reports name and daughter correctly on arrival        Exercises     Shoulder Instructions       General Comments pt tolerating pravlon boots and socks removed for skin check this session. pt with sacral pad in place but otherwise skin intact     Pertinent Vitals/ Pain       Pain Assessment: Faces Pain Score: 0-No pain Faces Pain Scale: Hurts even more Pain Location: Legs with ROM Pain Descriptors / Indicators: Grimacing;Guarding;Moaning Pain Intervention(s): Monitored during session;Repositioned  Home Living  Prior Functioning/Environment              Frequency  Min 2X/week        Progress Toward Goals  OT Goals(current goals can now be found in the care plan section)  Progress towards OT goals: Progressing toward goals  Acute Rehab OT Goals OT Goal Formulation: With family Time For Goal Achievement: 07/06/18 Potential to Achieve Goals: Fair ADL Goals Pt Will Perform Eating: with mod assist;with adaptive utensils;sitting Additional ADL Goal #1: Caregiver will demonstrate ability to donn/doff prevalon boots to improve heel integrity.  Plan Discharge plan remains appropriate    Co-evaluation                 AM-PAC PT "6 Clicks" Daily Activity     Outcome Measure   Help from another person eating meals?: Total Help from another person taking care of personal grooming?: Total Help from another person toileting, which includes using toliet, bedpan, or urinal?: Total Help from another person bathing (including washing, rinsing, drying)?: Total Help from another person to put on  and taking off regular upper body clothing?: Total Help from another person to put on and taking off regular lower body clothing?: Total 6 Click Score: 6    End of Session    OT Visit Diagnosis: Other abnormalities of gait and mobility (R26.89);Other symptoms and signs involving cognitive function;Pain;Muscle weakness (generalized) (M62.81)   Activity Tolerance Patient limited by lethargy   Patient Left in bed;with call bell/phone within reach;with bed alarm set;with family/visitor present   Nurse Communication Mobility status;Precautions        Time: 1224-4975 OT Time Calculation (min): 11 min  Charges: OT General Charges $OT Visit: 1 Visit OT Treatments $Therapeutic Activity: 8-22 mins   Jeri Modena, OTR/L  Acute Rehabilitation Services Pager: [phone] Office: [phone] .    Parke Poisson B 06/24/2018, 1:38 PM

## 2018-06-24 NOTE — Evaluation (Signed)
Physical Therapy Evaluation Patient Details Name: Shelley Cameron MRN: [id number] DOB: [birth year] Today's Date: 06/24/2018   History of Present Illness  84 y.o. female  PMH  Breast cancer s/p double mastectomy, a fib ( not on anticoagulation), seizure, stroke ( 2011), HTN, DM, traumatic SAH, bilateral DVTs, PE who presented to [facility] for c/o left sided weakness, numbness, facial droop and confusion. Patient transferred to [facility] 4N s/p TPA. Initial CT imaging negative for acute findings, though infarcts in the pons, largely chronic in appearance, old bilateral MCA infarcts noted. CT 10/27 new area of hypodensity in R insula.  Clinical Impression  Pt awake and mildly lethargic.  She was dependent in care at bed level.  Pt did sit at EOB without caregiver assist, but would not be safe to give a bath at EOB. Pt's daughter has reached a decision that she is damaging her health giving the significant assist needed to care for Ms. Schnoebelen at home alone.  She wishes SNF placement at this time..   Pt would match well with skilled level of care.   Will sign off at this time.    Follow Up Recommendations Supervision/Assistance - 24 hour;Other (comment)(pt could benefit from maintenance and restorative services )    Equipment Recommendations  Other (comment)(TBA)    Recommendations for Other Services       Precautions / Restrictions Precautions Precautions: Fall Precaution Comments: at risk for skin breadown      Mobility  Bed Mobility Overal bed mobility: Needs Assistance Bed Mobility: Rolling;Sidelying to Sit;Sit to Supine Rolling: Total assist Sidelying to sit: Total assist Supine to sit: Total assist Sit to supine: Total assist   General bed mobility comments: pt stiffens up and does not assist for any mobility  Transfers                 General transfer comment: NT at this time  Ambulation/Gait             General Gait Details: not able  Stairs            Wheelchair Mobility    Modified Rankin (Stroke Patients Only) Modified Rankin (Stroke Patients Only) Pre-Morbid Rankin Score: Severe disability Modified Rankin: Severe disability     Balance Overall balance assessment: Needs assistance Sitting-balance support: Single extremity supported;Bilateral upper extremity supported Sitting balance-Leahy Scale: Poor Sitting balance - Comments: sat at EOB without assist rocking back and forth in/out of midline, but needed UE's and guarding.                                     Pertinent Vitals/Pain Pain Assessment: Faces Faces Pain Scale: Hurts little more Pain Location: Legs with ROM Pain Descriptors / Indicators: Cramping;Moaning Pain Intervention(s): Limited activity within patient's tolerance    Home Living Family/patient expects to be discharged to:: Other (Comment)(daughter now feels that she can not care for pt and keep her) Living Arrangements: Children                    Prior Function Level of Independence: Needs assistance   Gait / Transfers Assistance Needed: unable to walk, depedent on dtr for transfers to chair or [facility]  ADL's / Homemaking Assistance Needed: dependent for all ADL's although feeds herself  Comments: assistance/dependent  with ADLs, dependent for IADLs. Patient family reports that she has not been walking unless family made her walk in the  last couple of months, able to walk a few feet to a chair. last week she did not walk at all.     Hand Dominance   Dominant Hand: Right    Extremity/Trunk Assessment   Upper Extremity Assessment Upper Extremity Assessment: Generalized weakness    Lower Extremity Assessment Lower Extremity Assessment: Generalized weakness(painful feet limit standing)       Communication   Communication: Expressive difficulties  Cognition Arousal/Alertness: Awake/alert Behavior During Therapy: Flat affect Overall Cognitive Status: Impaired/Different from  baseline Area of Impairment: Orientation;Attention;Memory;Following commands;Safety/judgement;Awareness;Problem solving                 Orientation Level: Disoriented to;Place;Time;Situation Current Attention Level: Focused Memory: Decreased recall of precautions;Decreased short-term memory Following Commands: Follows one step commands inconsistently Safety/Judgement: Decreased awareness of safety;Decreased awareness of deficits Awareness: Intellectual Problem Solving: Slow processing;Decreased initiation;Difficulty sequencing;Requires tactile cues;Requires verbal cues General Comments: pt reports name and daughter correctly on arrival      General Comments General comments (skin integrity, edema, etc.): pt has prevalon boots, sacral padding and was newly cleaned up from urinary incontinence before the end of the session.    Exercises     Assessment/Plan    PT Assessment Patent does not need any further PT services  PT Problem List         PT Treatment Interventions      PT Goals (Current goals can be found in the Care Plan section)  Acute Rehab PT Goals PT Goal Formulation: All assessment and education complete, DC therapy    Frequency     Barriers to discharge        Co-evaluation               AM-PAC PT "6 Clicks" Daily Activity  Outcome Measure Difficulty turning over in bed (including adjusting bedclothes, sheets and blankets)?: Unable Difficulty moving from lying on back to sitting on the side of the bed? : Unable Difficulty sitting down on and standing up from a chair with arms (e.g., wheelchair, bedside commode, etc,.)?: Unable Help needed moving to and from a bed to chair (including a wheelchair)?: Total Help needed walking in hospital room?: Total Help needed climbing 3-5 steps with a railing? : Total 6 Click Score: 6    End of Session   Activity Tolerance: Patient tolerated treatment well;Patient limited by pain Patient left: in bed;with  call bell/phone within reach;with family/visitor present Nurse Communication: Mobility status PT Visit Diagnosis: Other symptoms and signs involving the nervous system (R29.898);Dizziness and giddiness (R42)    Time: 0981-1914 PT Time Calculation (min) (ACUTE ONLY): 22 min   Charges:   PT Evaluation $PT Re-evaluation: 1 Re-eval          06/24/2018  Donnella Sham, PT Acute Rehabilitation Services [phone]  (pager) [phone]  (office)  Tessie Fass Tonia Avino 06/24/2018, 4:56 PM

## 2018-06-24 NOTE — Progress Notes (Signed)
  Speech Language Pathology Treatment: Dysphagia  Patient Details Name: Shelley Cameron MRN: [id number] DOB: November 16, 1929 Today's Date: 06/24/2018 Time: 1120-1140 SLP Time Calculation (min) (ACUTE ONLY): 20 min  Assessment / Plan / Recommendation Clinical Impression  Treatment completed during lunch meal.  Pt feeding herself with minimal verbal cues to maintain attention and to reduce oral pocketing. Pt quite sleepy, required gentle prompting to remain awake until pt was overcome by fatigue and tray was pulled away.  For the limited amount of purees/thin liquids that were provided, there were no overt s/s of aspiration. Primary obstacle is inattention, oral holding.  Pt should remain on dysphagia 1, thin liquids for now.    Pt's daughter voiced concerns about taking her home - she acknowledges that the burden of care and impact on her own health has become deleterious.  She would like to consider SNF, but has concerns about finances.      HPI HPI: 84 y.o. female  PMH  Breast cancer s/p double mastectomy, a fib ( not on anticoagulation), seizure, stroke ( 2011), HTN, DM, traumatic SAH, bilateral DVTs, PE who presented to [place] for c/o left sided weakness, numbness, facial droop and confusion. Patient transferred to [facility] 4N s/p TPA. Initial CT imaging negative for acute findings, though infarcts in the pons, largely chronic in appearance, old bilateral MCA infarcts noted.      SLP Plan  Continue with current plan of care       Recommendations  Diet recommendations: Dysphagia 1 (puree);Thin liquid Liquids provided via: Cup;Straw Medication Administration: Crushed with puree Supervision: Full supervision/cueing for compensatory strategies;Staff to assist with self feeding Compensations: Slow rate;Small sips/bites;Minimize environmental distractions;Follow solids with liquid Postural Changes and/or Swallow Maneuvers: Seated upright 90 degrees                Oral Care  Recommendations: Oral care BID Follow up Recommendations: Skilled Nursing facility SLP Visit Diagnosis: Dysphagia, oral phase (R13.11) Plan: Continue with current plan of care       GO              Riya Huxford L. Tivis Ringer, [place] Office number [phone] Pager [phone]   Juan Quam Laurice 06/24/2018, 11:58 AM

## 2018-06-24 NOTE — Progress Notes (Addendum)
STROKE TEAM PROGRESS NOTE  SUBJECTIVE (INTERVAL HISTORY) Her daughter is at the bedside. Daughter lives w/ her. Her condition was very similar PTA - not walking, dtr was transferring her. She had been on Xarelto in the past. It was stopped due to risk of fall. Now that she is not walking, discussed resumption of AC. dtr is agreeable. Will plan to start eliquis.   OBJECTIVE Vitals:   06/23/18 1931 06/23/18 2320 06/24/18 0320 06/24/18 0728  BP: 136/70 123/72 (!) 135/54 (!) 143/78  Pulse: 70 64 (!) 59 61  Resp: 18 18 18 18   Temp: 98.6 F (37 C) 98.6 F (37 C) 98.9 F (37.2 C) 98.7 F (37.1 C)  TempSrc: Axillary Axillary Axillary Axillary  SpO2: 99% 98% 97% 99%    CBC:  Recent Labs  Lab 06/20/18 1207  06/23/18 0527 06/24/18 0358  WBC 7.9   < > 10.3 7.8  NEUTROABS 5.4  --   --   --   HGB 11.1*   < > 9.8* 8.6*  HCT 34.8*   < > 31.8* 28.8*  MCV 80.4   < > 81.5 81.6  PLT 265   < > 261 243   < > = values in this interval not displayed.   BMP Latest Ref Rng & Units 06/24/2018 06/23/2018 06/22/2018  Glucose 70 - 99 mg/dL 99 77 92  BUN 8 - 23 mg/dL 11 7(L) 8  Creatinine 0.44 - 1.00 mg/dL 0.95 0.90 1.00  Sodium 135 - 145 mmol/L 143 145 143  Potassium 3.5 - 5.1 mmol/L 3.3(L) 3.4(L) 4.6  Chloride 98 - 111 mmol/L 109 109 108  CO2 22 - 32 mmol/L 26 27 23   Calcium 8.9 - 10.3 mg/dL 8.1(L) 8.6(L) 8.4(L)    IMAGING Ct Head Code Stroke Wo Contrast 06/20/2018 1. No intracranial hemorrhage or definite acute infarct.  2. New infarcts in the pons, largely chronic in appearance though a superimposed acute or subacute infarct is not excluded.  3. ASPECTS is 10.  4. Extensive cerebral atrophy and chronic small vessel ischemic disease with old infarcts as above.    Ct Head Wo Contrast 06/21/2018 1. No acute intracranial process.  2. Old bilateral MCA territory infarcts.  3. Severe chronic small vessel ischemic changes and old lacunar infarcts.  4. Severe atrophy most conspicuous within  mesial temporal lobes associated with neuro degenerative syndromes.   CT head 06/22/2018: 1. New area of hypodensity in the right insula since 06/20/2018 compatible with recent small infarct. No hemorrhage or mass effect. 2. Elsewhere continued stable noncontrast CT appearance of the brain.  Dg Chest Portable 1 View 06/22/2018 No acute cardiopulmonary findings.   Carotid Dopplers  There is 1-39% bilateral ICA stenosis. Vertebral artery flow is antegrade.   Transthoracic Echocardiogram  - Left ventricle: The cavity size was normal. Wall thickness was increased in a pattern of moderate LVH. Systolic function was normal. The estimated ejection fraction was in the range of 50% to 55%. The study is not technically sufficient to allow evaluation of LV diastolic function. - Aortic valve: Mildly calcified annulus. Mildly thickened leaflets. There was mild regurgitation. Valve area (VTI): 2.29 cm^2. Valve area (Vmean): 1.98 cm^2. - Mitral valve: Mildly calcified annulus. Normal thickness leaflets. There was mild regurgitation. - Atrial septum: No defect or patent foramen ovale was identified. - Technically difficult study.   PHYSICAL EXAM  - per Dr. Harmon Pier elderly African-American lady not in distress. . Afebrile. Head is nontraumatic. Neck is supple without bruit.  Cardiac exam no murmur or gallop. Lungs are clear to auscultation. Distal pulses are well felt. Neuro: Awake alert and interactive. Follows simple midline and one-step commands. Disoriented. Diminished attention registration and recall. Speech:    +Dysarthria,   Cranial Nerves:    The pupils are equal, round, and reactive to light.. Attempted, Fundi not visualized due to noncooperation.  EOMI. Right gaze preference can cross the mdline. Blinks on the right not on the left. Left lower facial droop, , Tongue midline. Hearing intact to voice. Shoulder shrug intact, tongue and uvula midline Motor Observation:    no involuntary  movements noted. Tone appears normal Strength:   Mild left hemiparesis but able to move against gravity. Poor effort hence cannot do exact strength testing.    Sensation:  Withdraws x 4 and grimaces to pain with vocalizations Plantars equivocal Reflexes: symmetric.    ASSESSMENT/PLAN Shelley Cameron is a 84 y.o. female with history of breast cancer s/p double mastectomy, a fib ( not on anticoagulation), PPM, seizure, stroke ( 2011), HTN, DM, traumatic SAH, dementia, bilateral DVTs, and PE (IVC filter) presenting with left side flacid, numbness, facial droop, drooling, slurred speech and confusion. tPA Given @ 1150 @ [facility] Friday 06/20/2018. She was transferred to [facility] following tPA.   Stroke:  right insular infarct s/p IV tPA, infarct embolic secondary to known atrial fibrillation not on [facility]  CT Head Code Stroke - 06/20/2018 No acute infarct. Old pontine infarct. Small vessel disease. Atrophy.  ASPECTS is 10.   CT Head - 06/21/2018 no acute stroke. Old bilateral MCA infarcts. Severe small vessel ischemic and old lacunar infarcts. Severe atrophy (mesial temporal lobes)  Repeat CT head - 06/22/2018 New hypodensity R insula since 06/20/2018   Carotid Dopplers - B ICA 1-39% stenosis, VAs antegrade   2D Echo  - EF 50 - 55%. No cardiac source of emboli identified.   LDL - 62  HgbA1c - 5.8  VTE prophylaxis - SCDs  No antithrombotic prior to admission, now on Eliquis. See below  Therapy recommendations:  [place] OT  No F/U PT - previously cared for at home by dtr - dtr now interested in SNF, at least part-time. Has been at [facility] in [place] in August. Will add PT eval to assist with placement plans  Disposition:  Pending  Atrial Fibrillation  Home anticoagulation:  none  . Dr. Leonie Man discussed with dtr - pt on [facility] w/ Xarelto in the past (stopped June 2019 d/t fall/ICH). given AF and pts immobile status, best to treat with [facility] for secondary stroke prevention. Will treat with eliquis. dtr  agreeable.  Marland Kitchen However, low hgb today - will hold eliquis and monitor Hgb . Continue Eliquis (apixaban) daily at discharge  Anemia  Mild anemia - 10.5 -> 11.1 -> 9.6->8.6  Hold Eliquis  Hypertension  Stable . BP 130-150s . Home lopressor 12.5 bid  . Now on Lopressor 25 bid with lower HRs, will resume home dose and monitor . Long-term BP goal normotensive  Hyperlipidemia  Lipid lowering medication PTA:  Mevacor 40 mg daily  LDL - 62, goal < 70  Current lipid lowering medication: Pravachol 40 mg daily (formulary change)  Continue statin at discharge  Diabetes  HgbA1c 5.8, goal < 7.0  Home meds: none  Controlled  Will check CBGs in the am daily  Other Stroke Risk Factors  Advanced age  Hx stroke/TIA  Pseudomonas UTI  Leukocytosis - 13.0 - afebrile  CXR negative  Initially treated with Rocephin  UCx shows pseudomonas  Change treatment to Cipro 250 x 3 days (start 10/29)  Other Active Problems  Baseline dementia on aricept and namenda, seroquel at hs  Seizure Hx - Keppra, Lamictal.   Hypokalemia - 3.3 -> supplement - recheck   Hospital day # [place], MSN, APRN, ANVP-BC, AGPCNP-BC Advanced Practice Stroke Nurse [place] for Schedule & Pager information 06/24/2018 3:09 PM  I have personally examined this patient, reviewed notes, independently viewed imaging studies, participated in medical decision making and plan of care.ROS completed by me personally and pertinent positives fully documented  I have made any additions or clarifications directly to the above note. Agree with note above. I had a long discussion with the patient's daughter regarding risk of recurrent strokes from atrial fibrillation and risk benefit of anticoagulation with eliquis and she is agreeable to starting it. Change Rocephin to Cipro for treatment for Pseudomonas UTI. Transfer to skilled nursing facility when bed available. Greater than 50% time  during this 25 minute visit was spent on counseling and coordination of care about her embolic stroke, UTI negative the patient and answered questions  Antony Contras, MD Medical Director [place] Pager: [phone] 06/24/2018 4:44 PM  To contact Stroke Continuity provider, please refer to [email or web address] After hours, contact General Neurology

## 2018-06-25 ENCOUNTER — Inpatient Hospital Stay (HOSPITAL_COMMUNITY): Payer: Medicare HMO

## 2018-06-25 LAB — BASIC METABOLIC PANEL
Anion gap: 4 — ABNORMAL LOW (ref 5–15)
BUN: 9 mg/dL (ref 8–23)
CALCIUM: 8.2 mg/dL — AB (ref 8.9–10.3)
CO2: 28 mmol/L (ref 22–32)
Chloride: 112 mmol/L — ABNORMAL HIGH (ref 98–111)
Creatinine, Ser: 0.87 mg/dL (ref 0.44–1.00)
GFR, EST NON AFRICAN AMERICAN: 59 mL/min — AB (ref >60)
Glucose, Bld: 94 mg/dL (ref 70–99)
Potassium: 3.7 mmol/L (ref 3.5–5.1)
SODIUM: 144 mmol/L (ref 135–145)

## 2018-06-25 LAB — CBC
HCT: 27.2 % — ABNORMAL LOW (ref 36.0–46.0)
Hemoglobin: 8.3 g/dL — ABNORMAL LOW (ref 12.0–15.0)
MCH: 24.9 pg — ABNORMAL LOW (ref 26.0–34.0)
MCHC: 30.5 g/dL (ref 30.0–36.0)
MCV: 81.4 fL (ref 80.0–100.0)
NRBC: 0 % (ref 0.0–0.2)
PLATELETS: 246 10*3/uL (ref 150–400)
RBC: 3.34 MIL/uL — ABNORMAL LOW (ref 3.87–5.11)
RDW: 17.1 % — AB (ref 11.5–15.5)
WBC: 8.2 10*3/uL (ref 4.0–10.5)

## 2018-06-25 MED ORDER — IOHEXOL 300 MG/ML  SOLN
100.0000 mL | Freq: Once | INTRAMUSCULAR | Status: AC | PRN
  Administered 2018-06-25: 100 mL via INTRAVENOUS

## 2018-06-25 NOTE — Progress Notes (Addendum)
STROKE TEAM PROGRESS NOTE  SUBJECTIVE (INTERVAL HISTORY) Her daughter is at the bedside. SW working on SNF placement, PT re-evaluated and recommended. D/t dropping HGB, will check belly and stools for blood.    OBJECTIVE Vitals:   06/24/18 1931 06/24/18 2323 06/25/18 0319 06/25/18 0726  BP: (!) 144/82 (!) 163/87 (!) 140/56 (!) 161/64  Pulse: 70 72 80 79  Resp: 18 18 18    Temp: 99 F (37.2 C) 98.8 F (37.1 C) 99 F (37.2 C) 98.9 F (37.2 C)  TempSrc: Axillary Axillary Axillary Axillary  SpO2: 99% 98% 100% 100%   CBG (last 3)  No results for input(s): GLUCAP in the last 72 hours.   CBC:  Recent Labs  Lab 06/20/18 1207  06/24/18 0358 06/25/18 0449  WBC 7.9   < > 7.8 8.2  NEUTROABS 5.4  --   --   --   HGB 11.1*   < > 8.6* 8.3*  HCT 34.8*   < > 28.8* 27.2*  MCV 80.4   < > 81.6 81.4  PLT 265   < > 243 246   < > = values in this interval not displayed.   BMP Latest Ref Rng & Units 06/25/2018 06/24/2018 06/23/2018  Glucose 70 - 99 mg/dL 94 99 77  BUN 8 - 23 mg/dL 9 11 7(L)  Creatinine 0.44 - 1.00 mg/dL 0.87 0.95 0.90  Sodium 135 - 145 mmol/L 144 143 145  Potassium 3.5 - 5.1 mmol/L 3.7 3.3(L) 3.4(L)  Chloride 98 - 111 mmol/L 112(H) 109 109  CO2 22 - 32 mmol/L 28 26 27   Calcium 8.9 - 10.3 mg/dL 8.2(L) 8.1(L) 8.6(L)    IMAGING Ct Head Code Stroke Wo Contrast 06/20/2018 1. No intracranial hemorrhage or definite acute infarct.  2. New infarcts in the pons, largely chronic in appearance though a superimposed acute or subacute infarct is not excluded.  3. ASPECTS is 10.  4. Extensive cerebral atrophy and chronic small vessel ischemic disease with old infarcts as above.    Ct Head Wo Contrast 06/21/2018 1. No acute intracranial process.  2. Old bilateral MCA territory infarcts.  3. Severe chronic small vessel ischemic changes and old lacunar infarcts.  4. Severe atrophy most conspicuous within mesial temporal lobes associated with neuro degenerative syndromes.   CT head  06/22/2018: 1. New area of hypodensity in the right insula since 06/20/2018 compatible with recent small infarct. No hemorrhage or mass effect. 2. Elsewhere continued stable noncontrast CT appearance of the brain.  Dg Chest Portable 1 View 06/22/2018 No acute cardiopulmonary findings.   Carotid Dopplers  There is 1-39% bilateral ICA stenosis. Vertebral artery flow is antegrade.   Transthoracic Echocardiogram  - Left ventricle: The cavity size was normal. Wall thickness was increased in a pattern of moderate LVH. Systolic function was normal. The estimated ejection fraction was in the range of 50% to 55%. The study is not technically sufficient to allow evaluation of LV diastolic function. - Aortic valve: Mildly calcified annulus. Mildly thickened leaflets. There was mild regurgitation. Valve area (VTI): 2.29 cm^2. Valve area (Vmean): 1.98 cm^2. - Mitral valve: Mildly calcified annulus. Normal thickness leaflets. There was mild regurgitation. - Atrial septum: No defect or patent foramen ovale was identified. - Technically difficult study.  CT Abd/pelvis pending    PHYSICAL EXAM  - per Dr. Leonie Man [place] elderly African-American lady not in distress. . Afebrile. Head is nontraumatic. Neck is supple without bruit.    Cardiac exam no murmur or gallop. Lungs are clear  to auscultation. Distal pulses are well felt. Neuro: Awake alert and interactive. Follows simple midline and one-step commands. Disoriented. Diminished attention registration and recall. Speech:    +Dysarthria,   Cranial Nerves:    The pupils are equal, round, and reactive to light.. Attempted, Fundi not visualized due to noncooperation.  EOMI. Right gaze preference can cross the mdline. Blinks on the right not on the left. Left lower facial droop, , Tongue midline. Hearing intact to voice. Shoulder shrug intact, tongue and uvula midline Motor Observation:    no involuntary movements noted. Tone appears normal Strength:    Mild left hemiparesis but able to move against gravity. Poor effort hence cannot do exact strength testing.    Sensation:  Withdraws x 4 and grimaces to pain with vocalizations Plantars equivocal Reflexes: symmetric.    ASSESSMENT/PLAN Ms. CALEA HRIBAR is a 84 y.o. female with history of breast cancer s/p double mastectomy, a fib ( not on anticoagulation), PPM, seizure, stroke ( 2011), HTN, DM, traumatic SAH, dementia, bilateral DVTs, and PE (IVC filter) presenting with left side flacid, numbness, facial droop, drooling, slurred speech and confusion. tPA Given @ 1150 @ [facility] Friday 06/20/2018. She was transferred to [facility] following tPA.   Stroke:  right insular infarct s/p IV tPA, infarct embolic secondary to known atrial fibrillation not on [facility]  CT Head Code Stroke - 06/20/2018 No acute infarct. Old pontine infarct. Small vessel disease. Atrophy.  ASPECTS is 10.   CT Head - 06/21/2018 no acute stroke. Old bilateral MCA infarcts. Severe small vessel ischemic and old lacunar infarcts. Severe atrophy (mesial temporal lobes)  Repeat CT head - 06/22/2018 New hypodensity R insula since 06/20/2018   Carotid Dopplers - B ICA 1-39% stenosis, VAs antegrade   2D Echo  - EF 50 - 55%. No cardiac source of emboli identified.   LDL - 62  HgbA1c - 5.8  VTE prophylaxis - SCDs  No antithrombotic prior to admission, now on no antithrombotics d/t dropping HGB with unknown etiology. See below  Therapy recommendations:  SNF -  previously cared for at home by dtr - dtr now interested in SNF, at least part-time. Has been at [facility] in [place] in August. Will add PT eval to assist with placement plans  Disposition:  Pending  Atrial Fibrillation  Home anticoagulation:  none  . Dr. Leonie Man discussed with dtr - pt on [facility] w/ Xarelto in the past (stopped June 2019 d/t fall/ICH). given AF and pts immobile status, best to treat with [facility] for secondary stroke prevention. Will treat with eliquis. dtr agreeable.   Marland Kitchen However, low hgb today - will hold eliquis/antithrombotics and monitor Hgb . Plan to Continue Eliquis (apixaban) daily at discharge  Anemia  Mild anemia - 10.5 -> 11.1 -> 9.6->8.6->8.3  Hold Eliquis/ASA  Check CT abd and pelvis for retroperitoneal bleed.  guiac stools  Hypertension  Stable . BP 130-150s . Home lopressor 12.5 bid resumed yesterday . HR now normalized . BP goal normotensive  Hyperlipidemia  Lipid lowering medication PTA:  Mevacor 40 mg daily  LDL - 62, goal < 70  Current lipid lowering medication: Pravachol 40 mg daily (formulary change)  Continue statin at discharge  Diabetes  HgbA1c 5.8, goal < 7.0  Home meds: none  Controlled  Glu 94  Other Stroke Risk Factors  Advanced age  Hx stroke/TIA  Pseudomonas UTI  Leukocytosis, resolved - 13.0 -> 8.2 afebrile  CXR negative  Initially treated with Rocephin  UCx shows pseudomonas  Change  treatment to Cipro 250 x 3 days (start 10/29) D2  Other Active Problems  Baseline dementia on aricept and namenda, seroquel at hs  Seizure Hx - Keppra, Lamictal  Hypokalemia - 3.3 -> supplement - recheck 3.7  Hospital day # [place], MSN, APRN, ANVP-BC, AGPCNP-BC Advanced Practice Stroke Nurse [place] for Schedule & Pager information 06/25/2018 11:34 AM  I have personally examined this patient, reviewed notes, independently viewed imaging studies, participated in medical decision making and plan of care.ROS completed by me personally and pertinent positives fully documented  I have made any additions or clarifications directly to the above note. Agree with note above. Discussed with patient and daughter and answered questions. Check CT scan of the abdomen for retroperitoneal hematoma. Greater than 50% time during this 25 minute visit was spent on counseling and coordination of care about the stroke and dropping hemoglobin and answered questions  Antony Contras,  MD Medical Director [place] Pager: [phone] 06/25/2018 5:31 PM   To contact Stroke Continuity provider, please refer to [email or web address] After hours, contact General Neurology

## 2018-06-26 DIAGNOSIS — I10 Essential (primary) hypertension: Secondary | ICD-10-CM

## 2018-06-26 DIAGNOSIS — F0390 Unspecified dementia without behavioral disturbance: Secondary | ICD-10-CM

## 2018-06-26 DIAGNOSIS — B965 Pseudomonas (aeruginosa) (mallei) (pseudomallei) as the cause of diseases classified elsewhere: Secondary | ICD-10-CM

## 2018-06-26 DIAGNOSIS — E785 Hyperlipidemia, unspecified: Secondary | ICD-10-CM

## 2018-06-26 DIAGNOSIS — N390 Urinary tract infection, site not specified: Secondary | ICD-10-CM

## 2018-06-26 DIAGNOSIS — Z23 Encounter for immunization: Secondary | ICD-10-CM | POA: Diagnosis not present

## 2018-06-26 LAB — BASIC METABOLIC PANEL
ANION GAP: 11 (ref 5–15)
BUN: 12 mg/dL (ref 8–23)
CALCIUM: 8.5 mg/dL — AB (ref 8.9–10.3)
CO2: 25 mmol/L (ref 22–32)
Chloride: 107 mmol/L (ref 98–111)
Creatinine, Ser: 0.97 mg/dL (ref 0.44–1.00)
GFR calc Af Amer: 60 mL/min — ABNORMAL LOW (ref >60)
GFR calc non Af Amer: 51 mL/min — ABNORMAL LOW (ref >60)
GLUCOSE: 98 mg/dL (ref 70–99)
Potassium: 3.9 mmol/L (ref 3.5–5.1)
Sodium: 143 mmol/L (ref 135–145)

## 2018-06-26 LAB — CBC
HEMATOCRIT: 28.8 % — AB (ref 36.0–46.0)
Hemoglobin: 9 g/dL — ABNORMAL LOW (ref 12.0–15.0)
MCH: 25.4 pg — AB (ref 26.0–34.0)
MCHC: 31.3 g/dL (ref 30.0–36.0)
MCV: 81.4 fL (ref 80.0–100.0)
NRBC: 0 % (ref 0.0–0.2)
Platelets: 249 10*3/uL (ref 150–400)
RBC: 3.54 MIL/uL — ABNORMAL LOW (ref 3.87–5.11)
RDW: 16.9 % — AB (ref 11.5–15.5)
WBC: 9.1 10*3/uL (ref 4.0–10.5)

## 2018-06-26 MED ORDER — METOPROLOL TARTRATE 25 MG PO TABS: 12.5000 mg | ORAL_TABLET | Freq: Two times a day (BID) | ORAL | Status: AC

## 2018-06-26 MED ORDER — CIPROFLOXACIN HCL 250 MG PO TABS: 250.0000 mg | ORAL_TABLET | Freq: Two times a day (BID) | ORAL | 0 refills | Status: DC

## 2018-06-26 MED ORDER — APIXABAN 5 MG PO TABS: 5.0000 mg | ORAL_TABLET | Freq: Two times a day (BID) | ORAL | Status: DC

## 2018-06-26 MED ORDER — APIXABAN 5 MG PO TABS: 5.0000 mg | ORAL_TABLET | Freq: Two times a day (BID) | ORAL | 2 refills | Status: DC

## 2018-06-26 NOTE — Progress Notes (Signed)
  Speech Language Pathology Treatment: Dysphagia  Patient Details Name: Shelley Cameron MRN: [id number] DOB: 16-Nov-1929 Today's Date: 06/26/2018 Time: 5038-8828 SLP Time Calculation (min) (ACUTE ONLY): 18 min  Assessment / Plan / Recommendation Clinical Impression  Pt was seen for skilled ST targeting dysphagia goals.  Pt was resting in bed but was awake and remained alert throughout treatment with simple environmental modifications to maximize alertness (repositioned to sitting upright in bed, lights turned on).  SLP facilitated the session with trials of dys 3 textures to determine readiness to advance diet.  Pt demonstrated slow but functional mastication of advanced solids although oral phase was noted to improve in terms of timeliness as trials progressed.    Pt had complete clearance of solids from the oral cavity with increased time.  Daughter was present and reported that pt often would fall asleep during meals at baseline.  She also reports that pt is nearing her baseline in regards to alertness and mentation.  Therefore, recommend advancing pt to dys 2 textures and thin liquids with full supervision for use of swallowing precautions.  Continue per current plan of care.    HPI HPI: 84 y.o. female  PMH  Breast cancer s/p double mastectomy, a fib ( not on anticoagulation), seizure, stroke ( 2011), HTN, DM, traumatic SAH, bilateral DVTs, PE who presented to [facility] for c/o left sided weakness, numbness, facial droop and confusion. Patient transferred to [facility] s/p TPA. Initial CT imaging negative for acute findings, though infarcts in the pons, largely chronic in appearance, old bilateral MCA infarcts noted.      SLP Plan  Continue with current plan of care       Recommendations  Diet recommendations: Dysphagia 2 (fine chop);Thin liquid Liquids provided via: Cup;Straw Medication Administration: Crushed with puree Supervision: Full supervision/cueing for compensatory  strategies Compensations: Slow rate;Small sips/bites;Minimize environmental distractions;Follow solids with liquid Postural Changes and/or Swallow Maneuvers: Seated upright 90 degrees                Oral Care Recommendations: Oral care BID Follow up Recommendations: Skilled Nursing facility SLP Visit Diagnosis: Dysphagia, oral phase (R13.11) Plan: Continue with current plan of care       GO                PageSelinda Orion 06/26/2018, 9:57 AM

## 2018-06-26 NOTE — Care Management Note (Signed)
Case Management Note  Patient Details  Name: Shelley Cameron MRN: [id number] Date of Birth: Jul 20, 1930  Subjective/Objective:                    Action/Plan: Patient discharging home with resumption of [facility] services. CM met with the patient and her daughter and they want to continue services with [facility]. CM called Sharyn Lull with [facility] and informed her or resumption orders.  Daughter requesting PTAR home. CM called and set up the transport. Transport form and NCB sheet on the chart. Bedside RN updated.  Pt starting on Eliquis. CM provided the daughter with 30 day free card. Daughter to follow up with their pharmacy for the monthly cost. If too expensive she will call PCP or neurology.  Expected Discharge Date:  06/26/18               Expected Discharge Plan:  [place]  In-House Referral:     Discharge planning Services  CM Consult  Post Acute Care Choice:  Home Health Choice offered to:  Patient, Adult Children  DME Arranged:    DME Agency:     HH Arranged:  PT, OT, Nurse's Aide, RN [place] Agency:  [facility]  Status of Service:  Completed, signed off  If discussed at [place] of Stay Meetings, dates discussed:    Additional Comments:  Pollie Friar, RN 06/26/2018, 2:33 PM

## 2018-06-26 NOTE — Discharge Summary (Addendum)
Stroke Discharge Summary  Patient ID: Shelley Cameron   MRN: [id number]      DOB: [birth year]  Date of Admission: 06/20/2018 Date of Discharge: 06/26/2018  Attending Physician:  Garvin Fila, MD, Stroke MD Consultant(s):    none  Patient's PCP:  Adin Hector, MD  DISCHARGE DIAGNOSIS:  Principal Problem:   Stroke [facility]) right insular cortex due to cardioembolic etiology from atrial fibrillation treated with IV TPA Active Problems:   Sick sinus syndrome ([place])   Chronic a-fib   Diabetes mellitus type 2, uncomplicated ([place])   Pressure injury of skin   Essential hypertension   Hyperlipemia   Pseudomonas urinary tract infection   Dementia ([place])   Past Medical History:  Diagnosis Date  . A-fib ([place])   . Anemia   . Arthritis   . Cancer ([place]) 1998, 1999   breast cancer. Bilateral mastectomy.   . Diabetes mellitus without complication ([place])   . DJD (degenerative joint disease)   . Gout   . Heart murmur   . Hyperlipidemia   . Hypertension   . Lymphedema of right arm   . Moderate dementia ([place])   . Seizures ([place])    Tonic-Clonic seizure activity  . Severe mitral insufficiency   . Stroke [facility]) 2011  . Syncope   . Systolic CHF ([place])   . Urinary incontinence    Past Surgical History:  Procedure Laterality Date  . BREAST SURGERY Left 08/1997   Dr Smith/mastectomy  . CATARACT EXTRACTION  2000  . EYE SURGERY Bilateral    Cataract Extraction with IOL  . IVC FILTER INSERTION N/A 03/26/2018   Procedure: IVC FILTER INSERTION;  Surgeon: Katha Cabal, MD;  Location: [place] CV LAB;  Service: Cardiovascular;  Laterality: N/A;  . MASTECTOMY Right 02/19/1997   Dr Bary Castilla  . MASTECTOMY Left 1999   Dr. Rochel Brome, [facility]  . PACEMAKER INSERTION Left 08/01/2016   Procedure: INSERTION PACEMAKER;  Surgeon: Isaias Cowman, MD;  Location: [facility] ORS;  Service: Cardiovascular;  Laterality: Left;    Allergies as of 06/26/2018      Reactions   Codeine Nausea Only       Medication List    TAKE these medications   acetaminophen 500 MG tablet Commonly known as:  TYLENOL Take 1,000 mg by mouth every 6 (six) hours as needed for mild pain.   apixaban 5 MG Tabs tablet Commonly known as:  ELIQUIS Take 1 tablet (5 mg total) by mouth 2 (two) times daily.   cetirizine 10 MG tablet Commonly known as:  ZYRTEC Take 10 mg by mouth daily as needed for allergies.   cholecalciferol 1000 units tablet Commonly known as:  VITAMIN D Take 2 tablets by mouth daily.   ciprofloxacin 250 MG tablet Commonly known as:  CIPRO Take 1 tablet (250 mg total) by mouth 2 (two) times daily.   colchicine 0.6 MG tablet Take 0.6 mg by mouth as needed (Gout).   donepezil 10 MG tablet Commonly known as:  ARICEPT Take 10 mg by mouth at bedtime.   lamoTRIgine 25 MG tablet Commonly known as:  LAMICTAL Take 25 mg by mouth 2 (two) times daily.   levETIRAcetam 750 MG tablet Commonly known as:  KEPPRA Take 750 mg by mouth 2 (two) times daily.   loratadine 10 MG tablet Commonly known as:  CLARITIN Take 10 mg by mouth daily as needed for allergies.   lovastatin 40 MG tablet Commonly known as:  MEVACOR  Take 40 mg by mouth at bedtime.   memantine 5 MG tablet Commonly known as:  NAMENDA Take 5 mg by mouth 2 (two) times daily.   metoprolol tartrate 25 MG tablet Commonly known as:  LOPRESSOR Take 0.5 tablets (12.5 mg total) by mouth 2 (two) times daily.   PEG 3350 Powd Take 17 g by mouth daily as needed (constipation).   QUEtiapine 50 MG tablet Commonly known as:  SEROQUEL Take 50 mg by mouth at bedtime.   senna-docusate 8.6-50 MG tablet Commonly known as:  Senokot-S Take 1 tablet by mouth daily.   traMADol 50 MG tablet Commonly known as:  ULTRAM Take 25 mg by mouth every 4 (four) hours as needed for moderate pain.   venlafaxine XR 37.5 MG 24 hr capsule Commonly known as:  EFFEXOR-XR Take 37.5 mg by mouth daily.   vitamin B-12 1000 MCG tablet Commonly  known as:  CYANOCOBALAMIN Take 1,000 mcg by mouth daily.       LABORATORY STUDIES CBC    Component Value Date/Time   WBC 9.1 06/26/2018 0503   RBC 3.54 (L) 06/26/2018 0503   HGB 9.0 (L) 06/26/2018 0503   HCT 28.8 (L) 06/26/2018 0503   PLT 249 06/26/2018 0503   MCV 81.4 06/26/2018 0503   MCH 25.4 (L) 06/26/2018 0503   MCHC 31.3 06/26/2018 0503   RDW 16.9 (H) 06/26/2018 0503   LYMPHSABS 1.8 06/20/2018 1207   MONOABS 0.4 06/20/2018 1207   EOSABS 0.2 06/20/2018 1207   BASOSABS 0.0 06/20/2018 1207   CMP    Component Value Date/Time   NA 143 06/26/2018 0503   K 3.9 06/26/2018 0503   CL 107 06/26/2018 0503   CO2 25 06/26/2018 0503   GLUCOSE 98 06/26/2018 0503   BUN 12 06/26/2018 0503   CREATININE 0.97 06/26/2018 0503   CALCIUM 8.5 (L) 06/26/2018 0503   PROT 7.6 06/20/2018 1207   ALBUMIN 3.3 (L) 06/20/2018 1207   AST 18 06/20/2018 1207   ALT 10 06/20/2018 1207   ALKPHOS 111 06/20/2018 1207   BILITOT 0.6 06/20/2018 1207   GFRNONAA 51 (L) 06/26/2018 0503   GFRAA 60 (L) 06/26/2018 0503   COAGS Lab Results  Component Value Date   INR 0.92 06/20/2018   INR 1.09 02/11/2018   INR 1.42 07/26/2016   Lipid Panel    Component Value Date/Time   CHOL 130 06/21/2018 0627   TRIG 84 06/21/2018 0627   HDL 51 06/21/2018 0627   CHOLHDL 2.5 06/21/2018 0627   VLDL 17 06/21/2018 0627   LDLCALC 62 06/21/2018 0627   HgbA1C  Lab Results  Component Value Date   HGBA1C 5.8 (H) 06/21/2018   Urinalysis    Component Value Date/Time   COLORURINE YELLOW 06/22/2018 1203   APPEARANCEUR CLOUDY (A) 06/22/2018 1203   LABSPEC 1.013 06/22/2018 1203   PHURINE 7.0 06/22/2018 1203   GLUCOSEU NEGATIVE 06/22/2018 1203   HGBUR SMALL (A) 06/22/2018 1203   BILIRUBINUR NEGATIVE 06/22/2018 1203   KETONESUR NEGATIVE 06/22/2018 1203   PROTEINUR 100 (A) 06/22/2018 1203   NITRITE POSITIVE (A) 06/22/2018 1203   LEUKOCYTESUR LARGE (A) 06/22/2018 1203   Urine Drug Screen No results found for:  LABOPIA, COCAINSCRNUR, LABBENZ, AMPHETMU, THCU, LABBARB  Alcohol Level    Component Value Date/Time   ETH <10 06/20/2018 1207     SIGNIFICANT DIAGNOSTIC STUDIES Ct Head Code Stroke Wo Contrast 06/20/2018 1. No intracranial hemorrhage or definite acute infarct.  2. New infarcts in the pons, largely chronic in  appearance though a superimposed acute or subacute infarct is not excluded.  3. ASPECTS is 10.  4. Extensive cerebral atrophy and chronic small vessel ischemic disease with old infarcts as above.    Ct Head Wo Contrast 06/21/2018 1. No acute intracranial process.  2. Old bilateral MCA territory infarcts.  3. Severe chronic small vessel ischemic changes and old lacunar infarcts.  4. Severe atrophy most conspicuous within mesial temporal lobes associated with neuro degenerative syndromes.   CT head 06/22/2018: 1. New area of hypodensity in the right insula since 06/20/2018 compatible with recent small infarct. No hemorrhage or mass effect. 2. Elsewhere continued stable noncontrast CT appearance of the brain.  Dg Chest Portable 1 View 06/22/2018 No acute cardiopulmonary findings.   Carotid Dopplers  There is 1-39% bilateral ICA stenosis. Vertebral artery flow is antegrade.   Transthoracic Echocardiogram  - Left ventricle: The cavity size was normal. Wall thickness wasincreased in a pattern of moderate LVH. Systolic function wasnormal. The estimated ejection fraction was in the range of 50%to 55%. The study is not technically sufficient to allowevaluation of LV diastolic function. - Aortic valve: Mildly calcified annulus. Mildly thickenedleaflets. There was mild regurgitation. Valve area (VTI): 2.29cm^2. Valve area (Vmean): 1.98 cm^2. - Mitral valve: Mildly calcified annulus. Normal thickness leaflets. There was mild regurgitation. - Atrial septum: No defect or patent foramen ovale was identified. - Technically difficult study.  CT Abd/pelvis 1. Cardiomegaly  without acute pulmonary disease 2. Interpolar right renal cyst measuring 2.9 cm. Cortical scarring in the lower pole the left kidney. No obstructive uropathy. 3. No acute bowel obstruction or inflammation. Moderate stool retention within colon. 4. Fat containing right inguinal hernia and probable spigelian hernia on the left.     HISTORY OF PRESENT ILLNESS Shelley Cameron is an 84 y.o. female PMH  Breast cancer s/p double mastectomy, a fib ( not on anticoagulation), seizure, stroke ( 2011), HTN, DM, traumatic SAH, bilateral DVTs, PE who presented to [facility] for c/o left sided weakness, numbness, facial droop and confusion. Patient transferred to [facility] 4N s/p TPA.  Per family patient went to bed last night 06/19/2018 about 0800 and woke up this morning in her usual state of health. At about 0945 am 06/20/2018 she started to notice some drooling, slurred speech, and her whole left side was flaccid. Denies any c/o HA, vision changes, or dizziness.  No seizure like activity was noted. Patient does have a history of seizures. Was taken to hospital 06/15/18 for seizure, but it was determined to be syncopal episode.  At baseline patient is able to feed herself. She does not bathe or dress herself and per daughter she is mostly bed bound. CTA show no hemorrhage. BG: 89.  BP: 134/114. Currently in a fib. Modified Rankin: Rankin Score=4. Initial NIHSS: 17. NIHSS post tpa: 8. She was admitted for further evaluation and treatment.   HOSPITAL COURSE Ms. SANOE HAZAN is a 84 y.o. female with history of breast cancer s/p double mastectomy, a fib ( not on anticoagulation), PPM, seizure, stroke ( 2011), HTN, DM, traumatic SAH, dementia, bilateral DVTs, and PE (IVC filter) presenting with left side flacid, numbness, facial droop, drooling, slurred speech and confusion. tPA Given @ 1150 @ [facility] Friday 06/20/2018. She was transferred to [facility] following tPA.   Stroke:  right insular infarct s/p IV tPA,  infarct embolic secondary to known atrial fibrillation not on [facility]  CT Head Code Stroke - 06/20/2018 No acute infarct. Old pontine infarct. Small vessel disease.  Atrophy.  ASPECTS is 10.   CT Head - 06/21/2018 no acute stroke. Old bilateral MCA infarcts. Severe small vessel ischemic and old lacunar infarcts. Severe atrophy (mesial temporal lobes)  Repeat CT head - 06/22/2018 New hypodensity R insula since 06/20/2018   Carotid Dopplers - B ICA 1-39% stenosis, VAs antegrade   2D Echo  - EF 50 - 55%. No cardiac source of emboli identified.   LDL - 62  HgbA1c - 5.8  No antithrombotic prior to admission, started eliquis at d/c as pt Hgb stable. Dr. Leonie Man discussed with dtr.  Therapy recommendations:  [facility] OT vs SNF - previously cared for at home by dtr - dtr now interested in SNF, at least part-time. Has been at [facility] in [place] in August. However, insurance companied denied.   Disposition:  home with HH PT, OT, RN and NA  Atrial Fibrillation  Home anticoagulation:  none   Dr. Leonie Man discussed with dtr - pt on [facility] w/ Xarelto in the past (stopped June 2019 d/t fall/ICH). given AF and pts immobile status, best to treat with [facility] for secondary stroke prevention. Will treat with eliquis. dtr agreeable.   However, low hgb today - will hold eliquis/antithrombotics and monitor Hgb  Start Eliquis (apixaban) daily at discharge  Anemia  Mild anemia - 10.5 -> 11.1 -> 9.6->8.6->8.3->9.0  CT abd and pelvis for retroperitoneal bleed.  Stable now  Stool request for guiac not completed  Hypertension  Stable  BP 130-150s  Home lopressor 12.5 bid resumed   BP goal normotensive  Hyperlipidemia  Lipid lowering medication PTA:  Mevacor 40 mg daily  LDL - 62, goal < 70  Current lipid lowering medication: Pravachol 40 mg daily (formulary change)  Continue home statin at discharge  Diabetes  HgbA1c 5.8, goal < 7.0  Home meds: none  Controlled  Glu 98  Other Stroke Risk  Factors  Advanced age  Hx stroke/TIA  Pseudomonas UTI  Leukocytosis, resolved - 13.0 -> 9.1 afebrile  CXR negative  Initially treated with Rocephin  UCx shows pseudomonas  Change treatment to Cipro 250 x 3 days (start 10/29) 2 more doses at d/c then stop  Other Active Problems  Baseline dementia on aricept and namenda, seroquel at hs  Seizure Hx - Keppra, Lamictal  Hypokalemia - 3.3 -> supplement - recheck 3.9   DISCHARGE EXAM Blood pressure (!) 146/82, pulse (!) 56, temperature 97.6 F (36.4 C), temperature source Axillary, resp. rate 18, SpO2 100 %. Pleasant elderly African-American lady not in distress. . Afebrile. Head is nontraumatic. Neck is supple without bruit.    Cardiac exam no murmur or gallop. Lungs are clear to auscultation. Distal pulses are well felt. Neuro: Awake alert and interactive. Follows simple midline and one-step commands. Disoriented. Diminished attention registration and recall. Speech:    +Dysarthria,   Cranial Nerves:    The pupils are equal, round, and reactive to light.. Attempted, Fundi not visualized due to noncooperation.  EOMI. Right gaze preference can cross the mdline. Blinks on the right not on the left. Left lower facial droop, , Tongue midline. Hearing intact to voice. Shoulder shrug intact, tongue and uvula midline Motor Observation:    no involuntary movements noted. Tone appears normal Strength:   Mild left hemiparesis but able to move against gravity. Poor effort hence cannot do exact strength testing.    Sensation:  Withdraws x 4 and grimaces to pain with vocalizations Plantars equivocal Reflexes: symmetric.   Discharge Diet   Dysphagia 2 thin liquids  DISCHARGE PLAN  Disposition:  Home  HH PT, OT, RN and NA  Eliquis (apixaban) daily for secondary stroke prevention.  Ongoing risk factor control by Primary Care Physician at time of discharge  Follow-up Tama High III, MD in 2 weeks.  Follow-up in [facility] in 4 weeks, office to schedule an appointment.   40 minutes were spent preparing discharge.  Burnetta Sabin, MSN, APRN, ANVP-BC, AGPCNP-BC Advanced Practice Stroke Nurse [place] for Schedule & Pager information 06/26/2018 2:09 PM   I have personally examined this patient, reviewed notes, independently viewed imaging studies, participated in medical decision making and plan of care.ROS completed by me personally and pertinent positives fully documented  I have made any additions or clarifications directly to the above note. Agree with note above.    Antony Contras, MD Medical Director [facility] Pager: [phone] 06/26/2018 5:04 PM

## 2018-06-26 NOTE — Plan of Care (Signed)
Max assist adls 

## 2018-06-26 NOTE — Consult Note (Signed)
   [facility] CM Inpatient Consult   06/26/2018  SHARISA TOVES 03/09/1930 [id number]  Patient screened for extreme risk score and for potential [place] [facility] restart of services and has [facility]. Patient had already transitioned home with home health with [facility].  Patient's primary care provider does the transition of care follow up.  Patient will be followed by Stroke EMMI calls.  For questions contact:   Natividad Brood, RN BSN [facility] Liaison  [phone] business mobile phone Toll free office [phone]

## 2018-06-29 DIAGNOSIS — F0390 Unspecified dementia without behavioral disturbance: Secondary | ICD-10-CM | POA: Diagnosis not present

## 2018-06-29 DIAGNOSIS — F0391 Unspecified dementia with behavioral disturbance: Secondary | ICD-10-CM | POA: Diagnosis not present

## 2018-06-29 DIAGNOSIS — G40401 Other generalized epilepsy and epileptic syndromes, not intractable, with status epilepticus: Secondary | ICD-10-CM | POA: Diagnosis not present

## 2018-06-29 DIAGNOSIS — I82493 Acute embolism and thrombosis of other specified deep vein of lower extremity, bilateral: Secondary | ICD-10-CM | POA: Diagnosis not present

## 2018-06-29 DIAGNOSIS — I824Y2 Acute embolism and thrombosis of unspecified deep veins of left proximal lower extremity: Secondary | ICD-10-CM | POA: Diagnosis not present

## 2018-06-29 DIAGNOSIS — M6281 Muscle weakness (generalized): Secondary | ICD-10-CM | POA: Diagnosis not present

## 2018-06-30 ENCOUNTER — Other Ambulatory Visit: Payer: Self-pay | Admitting: *Deleted

## 2018-06-30 NOTE — Patient Outreach (Addendum)
[place] [facility]) Care Management  06/30/2018  Shelley Cameron Jan 02, 1930 [id number]   EMMI- stroke    RED ON EMMI ALERT Day # 1 Date: 06/27/18 Friday 1002  Red Alert Reason:  Scheduled a follow up appointment? No  Filled new prescriptions? No  Been able to take every dose of meds? No  Problems setting up rehab? Didn't need any   Outreach attempt # 1 was not successful a voice message was left but her daughter, Shelley Cameron called back to [place] is able to verify HIPAA Shelley Cameron has dementia  Reviewed and addressed referral to [facility] with Shelley Cameron confirms Shelley Cameron was having a stool on her 3n1 and passed out  Shelley Cameron has been answering the EMMI questions She has had 6 ED visits and 2 admissions in the last 6 months  Last hospitalization was 06/20/18 to 10/06/27/18 for a Stroke [facility]) right insular cortex due to cardioembolic etiology from atrial fibrillation treated with IV TPA   She had been followed by [facility] CM until 05/19/18   Red Alerts: Shelley Cameron confirm f/u appointment not scheduled because Shelley Cameron has already appointments with Doctors making house calls. Shelley Cameron has called DSS, Doctors making house calls and [facility] since the 06/27/18 discharge home She is pending calls back from DSS and [facility] at this time.   Eliquis and Cipro have been filled but Shelley Cameron is noted to avoid eating foods with medicines in them and had difficulty initially swallowing whole tablets until 07/09/18. Doctors making house calls has also prescribed medicine for neuropathy (pain primary noted in left leg ad on entire left side)  Social: Shelley Cameron and her husband continue to stay at Shelley Cameron's home taking care of her She is now dependent in all care and is not walking. A hoyer left is being used to put her in the w/c, bed or couch but with some reported difficulty per Shelley Cameron. She is being seen now by Doctors making house calls every 28 days and prn  Shelley Cameron  has called [facility] home care to resume care for Shelley Cameron. [facility] was seeing her twice a week prior to the recent admission and ws working on sores on her heel and buttocks. She is not being seen by hospice at this time.  Shelley Cameron reports Shelley Cameron is now sleeping a lot Shelley Cameron's appetite is good (pureed or soft foods) but with poor mobility she has had concern with constipation. CM discussed encouraging fluids. She is being given Pericolace but is difficult to convince to take in fluids Ambulance services has been used for transportation out of the home  Shelley Cameron was informed during the admission process for the recent hospitalization that Shelley Cameron has been approved for medicaid  She remains on SSI and Shelley Cameron had initially been told she did not qualify for medicaid    Conditions: Sick sinus syndrome ([place]),  Chronic a-fib,  Diabetes mellitus type 2, uncomplicated ([place]),  Pressure injury of skin, Essential hypertension, Hyperlipemia, Pseudomonas urinary tract infection. Dementia ([place]), A fib, anemia, arthritis, breast cancer, [phone] s/p bilateral mastectomy, DJD, gout, heart murmur, Lymphedema of right arm, seizures, severe mitral insufficiency, syncope, systolic CHF, urinary incontinence, pacemaker  Medications: denies concerns with taking medications as prescribed, affording medications, side effects of medications and questions about medications   Advance Directives: [name], daughter and HCPOA She has a living will   Consent: [place] reviewed [facility] services with Shelley Cameron. Shelley Cameron gave verbal consent for services.  Plan: [facility] RN CM will close case at this time as patient has been assessed and no needs identified.   Pt encouraged to return a call to [facility] RN CM prn  Kimberly L. Lavina Hamman, RN, BSN, [place] Coordinator Office number [phone] Mobile number [phone]  Main [facility] number [phone] Fax number [phone]

## 2018-07-04 DIAGNOSIS — I10 Essential (primary) hypertension: Secondary | ICD-10-CM | POA: Diagnosis not present

## 2018-07-04 DIAGNOSIS — I4891 Unspecified atrial fibrillation: Secondary | ICD-10-CM | POA: Diagnosis not present

## 2018-07-04 DIAGNOSIS — Z7901 Long term (current) use of anticoagulants: Secondary | ICD-10-CM | POA: Diagnosis not present

## 2018-07-04 DIAGNOSIS — R55 Syncope and collapse: Secondary | ICD-10-CM | POA: Diagnosis not present

## 2018-07-04 DIAGNOSIS — Z7902 Long term (current) use of antithrombotics/antiplatelets: Secondary | ICD-10-CM | POA: Diagnosis not present

## 2018-07-04 DIAGNOSIS — Z8673 Personal history of transient ischemic attack (TIA), and cerebral infarction without residual deficits: Secondary | ICD-10-CM | POA: Diagnosis not present

## 2018-07-04 DIAGNOSIS — G894 Chronic pain syndrome: Secondary | ICD-10-CM | POA: Diagnosis not present

## 2018-07-04 DIAGNOSIS — E119 Type 2 diabetes mellitus without complications: Secondary | ICD-10-CM | POA: Diagnosis not present

## 2018-07-04 DIAGNOSIS — G40909 Epilepsy, unspecified, not intractable, without status epilepticus: Secondary | ICD-10-CM | POA: Diagnosis not present

## 2018-07-05 DIAGNOSIS — I824Y2 Acute embolism and thrombosis of unspecified deep veins of left proximal lower extremity: Secondary | ICD-10-CM | POA: Diagnosis not present

## 2018-07-05 DIAGNOSIS — G40401 Other generalized epilepsy and epileptic syndromes, not intractable, with status epilepticus: Secondary | ICD-10-CM | POA: Diagnosis not present

## 2018-07-05 DIAGNOSIS — F0390 Unspecified dementia without behavioral disturbance: Secondary | ICD-10-CM | POA: Diagnosis not present

## 2018-07-05 DIAGNOSIS — F0391 Unspecified dementia with behavioral disturbance: Secondary | ICD-10-CM | POA: Diagnosis not present

## 2018-07-05 DIAGNOSIS — M6281 Muscle weakness (generalized): Secondary | ICD-10-CM | POA: Diagnosis not present

## 2018-07-05 DIAGNOSIS — I82493 Acute embolism and thrombosis of other specified deep vein of lower extremity, bilateral: Secondary | ICD-10-CM | POA: Diagnosis not present

## 2018-07-07 ENCOUNTER — Other Ambulatory Visit: Payer: Self-pay | Admitting: *Deleted

## 2018-07-07 NOTE — Patient Outreach (Addendum)
[facility]) Care Management  07/07/2018  Shelley Cameron [birth year] [id number]    Care Coordination and EMMI stroke    Care coordination  [facility] RN CM received a voice message from pt's daughter Shelley Cameron  CM returned the call to Shelley Cameron is able to verify HIPAA Shelley Cameron reports no return calls from [facility] and decrease amount of medical supplies  Shelley Cameron reports concerns with Doctors making house calls stating that the pt should be seen by a neurologist and they do not have neurology staff in [place] calls Shelley Cameron voiced concern with the patient being homebound and not how tasking it is to get pt out of the home to medical appointments, therefore, her concern with getting Shelley Cameron to Dr Hardie Shackleton office    DME hoyer lift was returned   Consent: [place] reviewed [facility] services with Shelley Cameron.Shelley Cameron gave verbal consent for services.  And  RED ON EMMI ALERT Day # 9 Date:  07/05/18 1000 Red Alert Reason: Lost interest in things they used to enjoy?yes   [facility] Care Management RN reviewed and addressed red alert with patient Shelley Cameron confirms Shelley Cameron has a lost of interest in things she use to enjoy She is not interacting with family. She is holding medications in her mouth and then spitting it out. She is pushing Shelley Cameron away and telling her "It's not my medicine. You take it" This patient has dementia.and her s/s are progressing   Consent: [facility] RN CM reviewed [facility] services with patient. Patient gave verbal consent for services. Advised patient that there will be further automated EMMI- post discharge calls to assess how the patient is doing following the recent hospitalization Advised the patient that another call may be received from a nurse if any of their responses were abnormal. Patient voiced understanding and was appreciative of f/u call.      [facility] RN CM will follow up with Shelley Cameron and Shelley Cameron to check on the status of  services within the next 7 days   Cm provided contact information for [employer] and [employer] to [place] Referred to [facility] for assistance with transportation to neurology office (assistance to get to neurologist office for follow Pt is homebound/wheelchair bound, dementia patient) and community resources) [facility] RN CM called and spoke with Shelley [facility] at [facility] to be informed that Shelley Cameron has a D/c from [facility] on 06/24/18 from services while she was hospitalized and there was no new order sent to renew services  [facility] RN CM called Dr Leonie Man office and spoke with Shelley Cameron to request Epic referral for home health be faxed to [facility] but the pt has not been seen at the office [facility] RN CM attempted to reach hospital RN CM via phone and E-mail without success [facility] RN CM spoke with [facility] CM to have face sheet and face to face faxed to [facility] at [phone]  Shelley Cameron called back to inform Cm of the name of the NP (Shelley Cameron with Dr Clovia Cuff) working with Shelley Cameron [facility] RN CM spoke with Shelley Cameron at [facility] to confirm the fax orders for [facility] was received and nothing else was needed. Discussed low in supplies. Shelley Cameron reports they will work on the referral and attempt to see pt tomorrow    [place]. Shelley Hamman, RN, BSN, [place] Coordinator Office number [phone] Mobile number [phone]  Main [facility] number [phone] Fax number [phone]

## 2018-07-08 ENCOUNTER — Other Ambulatory Visit: Payer: Self-pay | Admitting: *Deleted

## 2018-07-08 ENCOUNTER — Encounter: Payer: Self-pay | Admitting: *Deleted

## 2018-07-08 DIAGNOSIS — R001 Bradycardia, unspecified: Secondary | ICD-10-CM | POA: Diagnosis not present

## 2018-07-08 NOTE — Patient Outreach (Signed)
[facility]) Care Management  07/08/2018  Shelley Cameron [birth year] [id number]   Care coordination  Collaborated with [facility] related concerns with transportation needs Provided some social and medical history information related to Shelley Cameron  Plan [facility] RN CM will follow up with Shelley Cameron and Shelley Cameron to check on the status of services within the next 7 days  Kimberly L. Lavina Hamman, RN, BSN, [place] Coordinator Office number [phone] Mobile number [phone]  Main [facility] number [phone] Fax number [phone]

## 2018-07-09 DIAGNOSIS — I482 Chronic atrial fibrillation, unspecified: Secondary | ICD-10-CM | POA: Diagnosis not present

## 2018-07-09 DIAGNOSIS — E119 Type 2 diabetes mellitus without complications: Secondary | ICD-10-CM | POA: Diagnosis not present

## 2018-07-09 DIAGNOSIS — F0391 Unspecified dementia with behavioral disturbance: Secondary | ICD-10-CM | POA: Diagnosis not present

## 2018-07-09 DIAGNOSIS — L89610 Pressure ulcer of right heel, unstageable: Secondary | ICD-10-CM | POA: Diagnosis not present

## 2018-07-09 DIAGNOSIS — I110 Hypertensive heart disease with heart failure: Secondary | ICD-10-CM | POA: Diagnosis not present

## 2018-07-09 DIAGNOSIS — G40909 Epilepsy, unspecified, not intractable, without status epilepticus: Secondary | ICD-10-CM | POA: Diagnosis not present

## 2018-07-09 DIAGNOSIS — I6931 Attention and concentration deficit following cerebral infarction: Secondary | ICD-10-CM | POA: Diagnosis not present

## 2018-07-09 DIAGNOSIS — I5020 Unspecified systolic (congestive) heart failure: Secondary | ICD-10-CM | POA: Diagnosis not present

## 2018-07-09 DIAGNOSIS — L89141 Pressure ulcer of left lower back, stage 1: Secondary | ICD-10-CM | POA: Diagnosis not present

## 2018-07-09 NOTE — Telephone Encounter (Signed)
This encounter was created in error - please disregard.

## 2018-07-11 ENCOUNTER — Other Ambulatory Visit: Payer: Self-pay | Admitting: *Deleted

## 2018-07-11 DIAGNOSIS — I110 Hypertensive heart disease with heart failure: Secondary | ICD-10-CM | POA: Diagnosis not present

## 2018-07-11 DIAGNOSIS — G40909 Epilepsy, unspecified, not intractable, without status epilepticus: Secondary | ICD-10-CM | POA: Diagnosis not present

## 2018-07-11 DIAGNOSIS — I6931 Attention and concentration deficit following cerebral infarction: Secondary | ICD-10-CM | POA: Diagnosis not present

## 2018-07-11 DIAGNOSIS — F0391 Unspecified dementia with behavioral disturbance: Secondary | ICD-10-CM | POA: Diagnosis not present

## 2018-07-11 DIAGNOSIS — I482 Chronic atrial fibrillation, unspecified: Secondary | ICD-10-CM | POA: Diagnosis not present

## 2018-07-11 DIAGNOSIS — I5020 Unspecified systolic (congestive) heart failure: Secondary | ICD-10-CM | POA: Diagnosis not present

## 2018-07-11 DIAGNOSIS — L89141 Pressure ulcer of left lower back, stage 1: Secondary | ICD-10-CM | POA: Diagnosis not present

## 2018-07-11 DIAGNOSIS — L89610 Pressure ulcer of right heel, unstageable: Secondary | ICD-10-CM | POA: Diagnosis not present

## 2018-07-11 DIAGNOSIS — E119 Type 2 diabetes mellitus without complications: Secondary | ICD-10-CM | POA: Diagnosis not present

## 2018-07-11 NOTE — Patient Outreach (Signed)
[facility]) Care Management  07/11/2018  ELENA DAVIA 1930-07-09 [id number]   Care coordination (f/u [place] Transportation)   [place] CM called to speak with her daughter, Wylene Men is able to verify HIPAA Reviewed collaboration with [place], [place] house call staff and [facility]  Consent: [place] reviewed [facility] services with [name]. [name] gave verbal consent for services.   Home health.  [name] confirms [facility] has visited on Tuesday and today, 07/11/18 She is being seen as a new admitted [place] pt.  She has been seen x 2 by RN and the PT called to come to visit. Supplies have been left for wound care.    Status of pt  She is presently up in her w/c eating in the kitchen.   [name] has transferred pt to w/c (no longer have hoyer -it was cumbersome, caused pain to the pt and was tasking to get the [employer] pad and straps adjusted)  The hoyer lift is not preferred by [employer] She reports she is able to transfer her to w/c better without it. Ms Crass is not ambulatory. She is not able to put pressure on her feet and has a decubitus area on her heel, buttocks and back per daughter.  [name] is assisting with the pacemaker monitoring for cardiology  Transportation to neurology and other medical appointments (this is the reported biggest issue at this time- how to get her out of the home to appointment)  [name] has checked on the cost of a ramp She confirms to rent one it is going to cost $75 a week and they will be able to pay this but only for week of appointments CM and [name] discussed the preference and possible options for getting the pt out of the home to appointments. CM encouraged the neurology as the patient has not been seen by neurology after recent hospitalization and has not seen her neurologist, Dr Melrose Nakayama " in a while" (daughter states probably at least a year). When she was being seen at the neurology office, the NP/PA Caryl Pina had been seeing Ms Yeats.  [name] has called the neurology office who states she has to be seen at the office. [name] does not prefer returning to Dr Leonie Man in [place] related to concern that Ms Andringa would not be able to tolerate the drive nor distance.  [name] has not made the MD appointment and is pending a solution to transportation. [place] voices that her husband is willing to assist prn [name] states she will call to make an neurology appointment on next week as she is aware that the neurology office does not accept calls after 12 pm on Fridays  [name] has called the local transportation services and is aware that forms need to be completed to get these services. [facility] RN CM encouraged her to get the forms and complete them to have the services as an alternative plan for transportation to appointments as Ms Cerveny will need to be seen by neurology, cardiology and podiatry.   Plan [facility] RN CM to follow up with Pt and daughter within 7 business days (neurology/medical appointment, local transportation, ? Closure)  [facility] RN CM to collaborate with [place] Lavina Hamman, RN, BSN, [place] Coordinator Office number [phone] Mobile number [phone]  Main [facility] number [phone] Fax number [phone]

## 2018-07-11 NOTE — Patient Outreach (Signed)
[facility]) Care Management  07/11/2018  LIEL RUDDEN 1930-04-09 [id number]    Phone call to patient's daughter to discuss transportation resources to the neurologist office. Per patient's daughter, patient is bed bound and has dementia. Due to mobility limitations they are unable to get patient into the car. Per patient's daughter, patient cannot walk or stand. Anytime patient is moved, she or her husband have to physically pick her up.  Per patient's daughter, she has contacted [facility] but is not sure if this is a viable resource as they do not provide door to door service. The [employer] transit partransit program does not service her area. This Education officer, museum suggested non-emergent ambulance service be arranged. Once patient secures an appointment with the neurologist, this social worker will contact patient's insurance to request authorization for the non-emergent ambulance service.  This Education officer, museum discussed caregiver support with patient's daughter.Discussed resources for respite care through [facility]. Contact information provided.   Plan: This social worker will contact patient's insurance for non-emergent ambulance to see the Neurologist. This social worker will follow up with patient's daughter within 1 week.   Sheralyn Boatman [facility] Care Management [phone]

## 2018-07-14 ENCOUNTER — Other Ambulatory Visit: Payer: Self-pay | Admitting: *Deleted

## 2018-07-14 DIAGNOSIS — I6931 Attention and concentration deficit following cerebral infarction: Secondary | ICD-10-CM | POA: Diagnosis not present

## 2018-07-14 DIAGNOSIS — G40909 Epilepsy, unspecified, not intractable, without status epilepticus: Secondary | ICD-10-CM | POA: Diagnosis not present

## 2018-07-14 DIAGNOSIS — L89141 Pressure ulcer of left lower back, stage 1: Secondary | ICD-10-CM | POA: Diagnosis not present

## 2018-07-14 DIAGNOSIS — I482 Chronic atrial fibrillation, unspecified: Secondary | ICD-10-CM | POA: Diagnosis not present

## 2018-07-14 DIAGNOSIS — I5020 Unspecified systolic (congestive) heart failure: Secondary | ICD-10-CM | POA: Diagnosis not present

## 2018-07-14 DIAGNOSIS — L89610 Pressure ulcer of right heel, unstageable: Secondary | ICD-10-CM | POA: Diagnosis not present

## 2018-07-14 DIAGNOSIS — E119 Type 2 diabetes mellitus without complications: Secondary | ICD-10-CM | POA: Diagnosis not present

## 2018-07-14 DIAGNOSIS — F0391 Unspecified dementia with behavioral disturbance: Secondary | ICD-10-CM | POA: Diagnosis not present

## 2018-07-14 DIAGNOSIS — I110 Hypertensive heart disease with heart failure: Secondary | ICD-10-CM | POA: Diagnosis not present

## 2018-07-14 NOTE — Patient Outreach (Signed)
[place] [facility]) Care Management  07/14/2018  Shelley Cameron 03/29/1930 [id number]   Phone call from patient's daughter who confirms appointment scheduled with the neurologist Dr. Melrose Nakayama is 08/10/18 at 6 am.  This social worker will contact patient's insurance company for authorization for non-emergent ambulance.   Sheralyn Boatman [facility] Care Management [phone]

## 2018-07-15 ENCOUNTER — Other Ambulatory Visit: Payer: Self-pay | Admitting: *Deleted

## 2018-07-15 NOTE — Patient Outreach (Signed)
[facility]) Care Management  07/15/2018  Shelley Cameron 1930-02-21 [id number]   Care coordination   Call to daughter to follow up on a call  Shelley Cameron is able to verify HIPAA Reviewed and addressed reason for th call  Shelley Cameron with recent chest pain (left side) assessed to be primary muscle pain  Consent: [facility] RN CM reviewed [facility] services with patient. Patient gave verbal consent for services.  appointment 07/31/18 Dr Melrose Nakayama was made    [employer] to speak with her about respite care recommend having eldercare to provide dementia/alzheimer caregiver education to return on 07/18/18  [facility] twice a week [place] RN, Kim came 07/14/18 HHPT no longer in place Recommend asking [place]  For streamline hoyer lift  Pt does not like increased stimulation related to interaction with grandchildren  Recommended she speak with neurologist about memory (progression of dementia) and stroke management  Plan [facility] RN CM will follow up with Shelley Cameron and Shelley Cameron within 14 days to follow up neurology appointment  Shelley Millin L. Lavina Hamman, RN, BSN, [place] Coordinator Office number [phone] Mobile number [phone]  Main [facility] number [phone] Fax number [phone]

## 2018-07-15 NOTE — Patient Outreach (Signed)
[facility]) Care Management  07/15/2018  Shelley Cameron 01-Nov-1929 [id number]   Phone call to [facility] to request authorization for non-emergent ambulance transport to patient's Neurologist appointment in 08/11/18. No information was abotr to be provided due to the fact that this social worker did not have patient approval to discuss patient information. This social worker explained role of social worker but was unable to get the information needed.  Conference call  to patient's daughter to assist with the consent need, however was then disconnected from the [facility] call when trying to connect. This Education officer, museum arranged with patient's daughter to conference call [employer] tomorrow.  Phone call to [employer], spoke with the billing department who requested a reference number if pre-authorization is obtained.   Shelley Cameron [facility] Care Management [phone]

## 2018-07-16 ENCOUNTER — Other Ambulatory Visit: Payer: Self-pay | Admitting: *Deleted

## 2018-07-16 ENCOUNTER — Ambulatory Visit: Payer: Self-pay | Admitting: *Deleted

## 2018-07-16 ENCOUNTER — Encounter: Payer: Self-pay | Admitting: *Deleted

## 2018-07-16 DIAGNOSIS — E119 Type 2 diabetes mellitus without complications: Secondary | ICD-10-CM | POA: Diagnosis not present

## 2018-07-16 DIAGNOSIS — F0391 Unspecified dementia with behavioral disturbance: Secondary | ICD-10-CM | POA: Diagnosis not present

## 2018-07-16 DIAGNOSIS — I482 Chronic atrial fibrillation, unspecified: Secondary | ICD-10-CM | POA: Diagnosis not present

## 2018-07-16 DIAGNOSIS — L89610 Pressure ulcer of right heel, unstageable: Secondary | ICD-10-CM | POA: Diagnosis not present

## 2018-07-16 DIAGNOSIS — G40909 Epilepsy, unspecified, not intractable, without status epilepticus: Secondary | ICD-10-CM | POA: Diagnosis not present

## 2018-07-16 DIAGNOSIS — I110 Hypertensive heart disease with heart failure: Secondary | ICD-10-CM | POA: Diagnosis not present

## 2018-07-16 DIAGNOSIS — L89141 Pressure ulcer of left lower back, stage 1: Secondary | ICD-10-CM | POA: Diagnosis not present

## 2018-07-16 DIAGNOSIS — I5020 Unspecified systolic (congestive) heart failure: Secondary | ICD-10-CM | POA: Diagnosis not present

## 2018-07-16 DIAGNOSIS — I6931 Attention and concentration deficit following cerebral infarction: Secondary | ICD-10-CM | POA: Diagnosis not present

## 2018-07-16 NOTE — Patient Outreach (Signed)
[facility] [facility]) Care Management  07/16/2018  Shelley Cameron 28-Feb-1930 [id number]   Phone call to [employer]-authorizations who confirmed that there is no need for pre-authorization,(confirmtion number [id number],  however there is limited coverage. This Education officer, museum then spoke with the benefits department who stated that patient will have to pay a co-pay of $265.00. The [facility] service [phone] will file the claim with the insurance company.  Plan: Patient's  daughter to be provided with the phone number and co-pay expectation for the non-emergent ambulance ride to the Neurologist on 08/10/18.   Sheralyn Boatman [facility] Care Management [phone]

## 2018-07-16 NOTE — Patient Outreach (Addendum)
  [facility]) Care Management  07/16/2018  Shelley Cameron [birth year] [id number]    Phone call to patient's daughter. Discussed non-emergent transport. Co-pay of $256.00 discussed as well as contact number given for [employer] [phone]. Patient's daughter is unsure if she will use this company due to the co-pay cost and may see if she and her husband can get patient in their personal car. Patient's daughter also confirmed that patient's appointment with Neurologist is 07/29/18 at 11:00am.  It was confirmed that patient has Medicaid part B that only pays her medicare premiums.   Patient's daughter also confirmed that she has contacted [facility] and she has an appointment on 07/18/18 for an assessment to start respite care services.   Patient's daughter  verbalized having no additional community resource needs. This Education officer, museum will sign off at this time. Patient's daughter encouraged to contact this social worker if there are any needs in the future.   Sheralyn Boatman [facility] Care Management [phone]

## 2018-07-17 ENCOUNTER — Other Ambulatory Visit: Payer: Self-pay | Admitting: *Deleted

## 2018-07-17 ENCOUNTER — Observation Stay
Admission: EM | Admit: 2018-07-17 | Discharge: 2018-07-18 | Disposition: A | Discharge status: Home Health | Payer: Medicare HMO | Attending: Specialist | Admitting: Specialist

## 2018-07-17 ENCOUNTER — Other Ambulatory Visit: Payer: Self-pay

## 2018-07-17 DIAGNOSIS — K922 Gastrointestinal hemorrhage, unspecified: Secondary | ICD-10-CM | POA: Diagnosis not present

## 2018-07-17 DIAGNOSIS — E1122 Type 2 diabetes mellitus with diabetic chronic kidney disease: Secondary | ICD-10-CM | POA: Insufficient documentation

## 2018-07-17 DIAGNOSIS — M109 Gout, unspecified: Secondary | ICD-10-CM | POA: Diagnosis not present

## 2018-07-17 DIAGNOSIS — I5020 Unspecified systolic (congestive) heart failure: Secondary | ICD-10-CM | POA: Diagnosis not present

## 2018-07-17 DIAGNOSIS — K625 Hemorrhage of anus and rectum: Secondary | ICD-10-CM

## 2018-07-17 DIAGNOSIS — I480 Paroxysmal atrial fibrillation: Secondary | ICD-10-CM | POA: Diagnosis not present

## 2018-07-17 DIAGNOSIS — Z66 Do not resuscitate: Secondary | ICD-10-CM | POA: Insufficient documentation

## 2018-07-17 DIAGNOSIS — N183 Chronic kidney disease, stage 3 (moderate): Secondary | ICD-10-CM | POA: Diagnosis not present

## 2018-07-17 DIAGNOSIS — Z853 Personal history of malignant neoplasm of breast: Secondary | ICD-10-CM | POA: Insufficient documentation

## 2018-07-17 DIAGNOSIS — Z950 Presence of cardiac pacemaker: Secondary | ICD-10-CM | POA: Insufficient documentation

## 2018-07-17 DIAGNOSIS — F329 Major depressive disorder, single episode, unspecified: Secondary | ICD-10-CM | POA: Diagnosis not present

## 2018-07-17 DIAGNOSIS — Z7901 Long term (current) use of anticoagulants: Secondary | ICD-10-CM | POA: Insufficient documentation

## 2018-07-17 DIAGNOSIS — E876 Hypokalemia: Secondary | ICD-10-CM | POA: Insufficient documentation

## 2018-07-17 DIAGNOSIS — Z9013 Acquired absence of bilateral breasts and nipples: Secondary | ICD-10-CM | POA: Diagnosis not present

## 2018-07-17 DIAGNOSIS — Z8673 Personal history of transient ischemic attack (TIA), and cerebral infarction without residual deficits: Secondary | ICD-10-CM | POA: Diagnosis not present

## 2018-07-17 DIAGNOSIS — Z79899 Other long term (current) drug therapy: Secondary | ICD-10-CM | POA: Diagnosis not present

## 2018-07-17 DIAGNOSIS — Z86711 Personal history of pulmonary embolism: Secondary | ICD-10-CM | POA: Insufficient documentation

## 2018-07-17 DIAGNOSIS — E119 Type 2 diabetes mellitus without complications: Secondary | ICD-10-CM | POA: Diagnosis not present

## 2018-07-17 DIAGNOSIS — E785 Hyperlipidemia, unspecified: Secondary | ICD-10-CM | POA: Insufficient documentation

## 2018-07-17 DIAGNOSIS — I130 Hypertensive heart and chronic kidney disease with heart failure and stage 1 through stage 4 chronic kidney disease, or unspecified chronic kidney disease: Secondary | ICD-10-CM | POA: Insufficient documentation

## 2018-07-17 DIAGNOSIS — R569 Unspecified convulsions: Secondary | ICD-10-CM | POA: Diagnosis not present

## 2018-07-17 DIAGNOSIS — F0390 Unspecified dementia without behavioral disturbance: Secondary | ICD-10-CM | POA: Insufficient documentation

## 2018-07-17 DIAGNOSIS — Z86718 Personal history of other venous thrombosis and embolism: Secondary | ICD-10-CM | POA: Diagnosis not present

## 2018-07-17 DIAGNOSIS — I482 Chronic atrial fibrillation, unspecified: Secondary | ICD-10-CM | POA: Diagnosis not present

## 2018-07-17 DIAGNOSIS — D649 Anemia, unspecified: Secondary | ICD-10-CM | POA: Diagnosis not present

## 2018-07-17 DIAGNOSIS — M79672 Pain in left foot: Secondary | ICD-10-CM

## 2018-07-17 DIAGNOSIS — R1310 Dysphagia, unspecified: Secondary | ICD-10-CM | POA: Diagnosis not present

## 2018-07-17 LAB — CBC WITH DIFFERENTIAL/PLATELET
ABS IMMATURE GRANULOCYTES: 0.03 10*3/uL (ref 0.00–0.07)
BASOS PCT: 1 %
Basophils Absolute: 0 10*3/uL (ref 0.0–0.1)
EOS ABS: 0.4 10*3/uL (ref 0.0–0.5)
EOS PCT: 5 %
HCT: 34.5 % — ABNORMAL LOW (ref 36.0–46.0)
Hemoglobin: 10.6 g/dL — ABNORMAL LOW (ref 12.0–15.0)
Immature Granulocytes: 0 %
Lymphocytes Relative: 37 %
Lymphs Abs: 3.2 10*3/uL (ref 0.7–4.0)
MCH: 25.2 pg — AB (ref 26.0–34.0)
MCHC: 30.7 g/dL (ref 30.0–36.0)
MCV: 81.9 fL (ref 80.0–100.0)
MONO ABS: 0.6 10*3/uL (ref 0.1–1.0)
MONOS PCT: 7 %
Neutro Abs: 4.3 10*3/uL (ref 1.7–7.7)
Neutrophils Relative %: 50 %
PLATELETS: 316 10*3/uL (ref 150–400)
RBC: 4.21 MIL/uL (ref 3.87–5.11)
RDW: 17 % — AB (ref 11.5–15.5)
WBC: 8.6 10*3/uL (ref 4.0–10.5)
nRBC: 0 % (ref 0.0–0.2)

## 2018-07-17 LAB — COMPREHENSIVE METABOLIC PANEL
ALT: 22 U/L (ref 0–44)
AST: 23 U/L (ref 15–41)
Albumin: 3.1 g/dL — ABNORMAL LOW (ref 3.5–5.0)
Alkaline Phosphatase: 105 U/L (ref 38–126)
Anion gap: 11 (ref 5–15)
BILIRUBIN TOTAL: 0.5 mg/dL (ref 0.3–1.2)
BUN: 9 mg/dL (ref 8–23)
CO2: 29 mmol/L (ref 22–32)
CREATININE: 0.74 mg/dL (ref 0.44–1.00)
Calcium: 8.4 mg/dL — ABNORMAL LOW (ref 8.9–10.3)
Chloride: 103 mmol/L (ref 98–111)
GFR calc Af Amer: >60 mL/min (ref >60)
GLUCOSE: 88 mg/dL (ref 70–99)
Potassium: 2.9 mmol/L — ABNORMAL LOW (ref 3.5–5.1)
Sodium: 143 mmol/L (ref 135–145)
TOTAL PROTEIN: 7 g/dL (ref 6.5–8.1)

## 2018-07-17 LAB — TYPE AND SCREEN
ABO/RH(D): B POS
Antibody Screen: NEGATIVE

## 2018-07-17 LAB — HEMOGLOBIN: Hemoglobin: 10.3 g/dL — ABNORMAL LOW (ref 12.0–15.0)

## 2018-07-17 LAB — GLUCOSE, CAPILLARY
GLUCOSE-CAPILLARY: 47 mg/dL — AB (ref 70–99)
Glucose-Capillary: 74 mg/dL (ref 70–99)
Glucose-Capillary: 110 mg/dL — ABNORMAL HIGH (ref 70–99)

## 2018-07-17 MED ORDER — MEMANTINE HCL 5 MG PO TABS
5.0000 mg | ORAL_TABLET | Freq: Two times a day (BID) | ORAL | Status: DC
  Administered 2018-07-17 – 2018-07-18 (×2): 5 mg via ORAL
  Filled 2018-07-17 (×2): qty 1

## 2018-07-17 MED ORDER — ONDANSETRON HCL 4 MG/2ML IJ SOLN: 4.0000 mg | Freq: Four times a day (QID) | INTRAMUSCULAR | Status: DC | PRN

## 2018-07-17 MED ORDER — POTASSIUM CHLORIDE CRYS ER 20 MEQ PO TBCR
40.0000 meq | EXTENDED_RELEASE_TABLET | Freq: Once | ORAL | Status: AC
  Administered 2018-07-17: 40 meq via ORAL
  Filled 2018-07-17: qty 2

## 2018-07-17 MED ORDER — VITAMIN B-12 1000 MCG PO TABS
1000.0000 ug | ORAL_TABLET | Freq: Every day | ORAL | Status: DC
  Administered 2018-07-18: 1000 ug via ORAL
  Filled 2018-07-17: qty 1

## 2018-07-17 MED ORDER — INSULIN ASPART 100 UNIT/ML ~~LOC~~ SOLN: 0.0000 [IU] | Freq: Three times a day (TID) | SUBCUTANEOUS | Status: DC

## 2018-07-17 MED ORDER — VITAMIN D 25 MCG (1000 UNIT) PO TABS
2000.0000 [IU] | ORAL_TABLET | Freq: Every day | ORAL | Status: DC
  Administered 2018-07-18: 2000 [IU] via ORAL
  Filled 2018-07-17: qty 2

## 2018-07-17 MED ORDER — VENLAFAXINE HCL ER 37.5 MG PO CP24
37.5000 mg | ORAL_CAPSULE | Freq: Every day | ORAL | Status: DC
  Administered 2018-07-18: 37.5 mg via ORAL
  Filled 2018-07-17 (×2): qty 1

## 2018-07-17 MED ORDER — POLYETHYLENE GLYCOL 3350 17 G PO PACK: 17.0000 g | PACK | Freq: Every day | ORAL | Status: DC | PRN

## 2018-07-17 MED ORDER — METOPROLOL TARTRATE 25 MG PO TABS
12.5000 mg | ORAL_TABLET | Freq: Two times a day (BID) | ORAL | Status: DC
  Administered 2018-07-17 – 2018-07-18 (×2): 12.5 mg via ORAL
  Filled 2018-07-17 (×2): qty 1

## 2018-07-17 MED ORDER — COLCHICINE 0.6 MG PO TABS: 0.6000 mg | ORAL_TABLET | ORAL | Status: DC | PRN

## 2018-07-17 MED ORDER — SODIUM CHLORIDE 0.9 % IV SOLN: 250.0000 mL | INTRAVENOUS | Status: DC | PRN

## 2018-07-17 MED ORDER — ONDANSETRON HCL 4 MG PO TABS: 4.0000 mg | ORAL_TABLET | Freq: Four times a day (QID) | ORAL | Status: DC | PRN

## 2018-07-17 MED ORDER — SENNOSIDES-DOCUSATE SODIUM 8.6-50 MG PO TABS
1.0000 | ORAL_TABLET | Freq: Every day | ORAL | Status: DC
  Administered 2018-07-18: 1 via ORAL
  Filled 2018-07-17: qty 1

## 2018-07-17 MED ORDER — PRAVASTATIN SODIUM 20 MG PO TABS: 20.0000 mg | ORAL_TABLET | Freq: Every day | ORAL | Status: DC

## 2018-07-17 MED ORDER — LAMOTRIGINE 25 MG PO TABS
25.0000 mg | ORAL_TABLET | Freq: Two times a day (BID) | ORAL | Status: DC
  Administered 2018-07-17 – 2018-07-18 (×2): 25 mg via ORAL
  Filled 2018-07-17 (×2): qty 1

## 2018-07-17 MED ORDER — ORAL CARE MOUTH RINSE
15.0000 mL | Freq: Two times a day (BID) | OROMUCOSAL | Status: DC
  Administered 2018-07-17 – 2018-07-18 (×2): 15 mL via OROMUCOSAL

## 2018-07-17 MED ORDER — SODIUM CHLORIDE 0.9% FLUSH
3.0000 mL | Freq: Two times a day (BID) | INTRAVENOUS | Status: DC
  Administered 2018-07-17 – 2018-07-18 (×2): 3 mL via INTRAVENOUS

## 2018-07-17 MED ORDER — LEVETIRACETAM 750 MG PO TABS
750.0000 mg | ORAL_TABLET | Freq: Two times a day (BID) | ORAL | Status: DC
  Administered 2018-07-17 – 2018-07-18 (×2): 750 mg via ORAL
  Filled 2018-07-17 (×3): qty 1

## 2018-07-17 MED ORDER — POLYETHYLENE GLYCOL 3350 17 GM/SCOOP PO POWD
17.0000 g | Freq: Every day | ORAL | Status: DC | PRN
  Filled 2018-07-17: qty 255

## 2018-07-17 MED ORDER — TRAMADOL HCL 50 MG PO TABS: 25.0000 mg | ORAL_TABLET | ORAL | Status: DC | PRN

## 2018-07-17 MED ORDER — LORATADINE 10 MG PO TABS: 10.0000 mg | ORAL_TABLET | Freq: Every day | ORAL | Status: DC | PRN

## 2018-07-17 MED ORDER — ACETAMINOPHEN 650 MG RE SUPP: 650.0000 mg | Freq: Four times a day (QID) | RECTAL | Status: DC | PRN

## 2018-07-17 MED ORDER — DONEPEZIL HCL 5 MG PO TABS
10.0000 mg | ORAL_TABLET | Freq: Every day | ORAL | Status: DC
  Administered 2018-07-17: 10 mg via ORAL
  Filled 2018-07-17 (×2): qty 2

## 2018-07-17 MED ORDER — LORATADINE 10 MG PO TABS
10.0000 mg | ORAL_TABLET | Freq: Every day | ORAL | Status: DC
  Administered 2018-07-18: 10 mg via ORAL
  Filled 2018-07-17: qty 1

## 2018-07-17 MED ORDER — ACETAMINOPHEN 325 MG PO TABS: 650.0000 mg | ORAL_TABLET | Freq: Four times a day (QID) | ORAL | Status: DC | PRN

## 2018-07-17 MED ORDER — ACETAMINOPHEN 500 MG PO TABS: 1000.0000 mg | ORAL_TABLET | Freq: Four times a day (QID) | ORAL | Status: DC | PRN

## 2018-07-17 MED ORDER — QUETIAPINE FUMARATE 25 MG PO TABS
50.0000 mg | ORAL_TABLET | Freq: Every day | ORAL | Status: DC
  Administered 2018-07-17: 50 mg via ORAL
  Filled 2018-07-17: qty 2

## 2018-07-17 NOTE — Progress Notes (Signed)
Hypoglycemic Event  CBG: 47 at 1756   Treatment: pt.'s food arrived and regular soda was given to pt  Symptoms: asymptomatic  Follow-up CBG: Time: 1833 CBG Result: 74  Possible Reasons for Event: pt did not eat all day     Shelley Cameron [employer]

## 2018-07-17 NOTE — Telephone Encounter (Signed)
This encounter was created in error - please disregard.

## 2018-07-17 NOTE — ED Triage Notes (Addendum)
Pt comes via [facility] from home with c/o rectal bleeding. Pt states pain in her belly at this time. EMS reports stable vital signs. Pt is alert and oriented. EMS also reports family states a growth in pt's rectal as well.  Pt had stroke about 3 weeks ago with no deficients noted at this time per family.

## 2018-07-17 NOTE — Consult Note (Addendum)
[place] Nurse wound consult note Patient examined in [place] and turned to view the buttocks area with the assistance of her primary care RN. Reason for Consult: Sacral and heel wounds Wound type: There are no wounds to report at the time of my assessment.  The bilateral heels have areas of hypo and hyperpigmentation, but not open wounds.  The right heel has some fissures, but no drainage, erythema, or induration.  The buttocks had a small foam dressing that the daughter had placed prior to arrival, at the top of the gluteal fold.  When peeled down, no wound was found.  The patient does not have any rash, erythema, MASD, or evidence of fungal involvement to the buttocks, sacrum, coccyx, or gluteal fold.  The patient has some hyperpigmentation to the buttocks/sacrum/coccyx, but no evidence of wounds.  Plan of care:  Perineal care with no rinse cleanser and moisturizer; moisturizer to bilateral heels, no use of heel foam dressings; turning every 2 hours; and floating heels.  Thank you for the consult.  Discussed plan of care with the patient and bedside nurse.  [place] nurse will not follow at this time.  Please re-consult the [place] team if needed.  Val Riles, RN, MSN, CWOCN, CNS-BC, pager [phone]

## 2018-07-17 NOTE — ED Provider Notes (Signed)
[facility] Emergency Department Provider Note ____________________________________________   I have reviewed the triage vital signs and the triage nursing note.  HISTORY  Chief Complaint Rectal Bleeding   Historian Patient's daughter and caregiver with whom she lives  HPI Shelley Cameron is a 84 y.o. female presents with daughter for rectal bleeding noted this morning.  Patient is on Eliquis for history of a stroke within the past couple months.  Prior to that she had had a head bleed and so was off of blood thinners and then during the interim she had blood clots in the legs in the lungs and had a IVC filter placed and then she had the stroke and just recently over the past week or so was started back on Eliquis.  Patient is essentially bedbound and had a bowel movement and when the daughter went to change her noticed that something pink was at the rectal opening and was tender.  The patient was having a bowel movement which was brown and there was bright red blood that was there as well.  Daughter reported a fairly large amount.  No dark stool or clots it was all bright red.  Patient has external hemorrhoids but these are not irritated.  The pink fleshy material is got back inside.     Past Medical History:  Diagnosis Date  . A-fib ([place])   . Anemia   . Arthritis   . Cancer ([place]) 1998, 1999   breast cancer. Bilateral mastectomy.   . Diabetes mellitus without complication ([place])   . DJD (degenerative joint disease)   . Gout   . Heart murmur   . Hyperlipidemia   . Hypertension   . Lymphedema of right arm   . Moderate dementia ([place])   . Seizures ([place])    Tonic-Clonic seizure activity  . Severe mitral insufficiency   . Stroke [facility]) 2011  . Syncope   . Systolic CHF ([place])   . Urinary incontinence     Patient Active Problem List   Diagnosis Date Noted  . Essential hypertension 06/26/2018  . Hyperlipemia 06/26/2018  . Pseudomonas urinary tract  infection 06/26/2018  . Dementia ([place]) 06/26/2018  . Pressure injury of skin 06/21/2018  . Stroke ([place]) 06/20/2018  . DVT (deep vein thrombosis) in pregnancy 03/25/2018  . Closed head injury without concussion 02/24/2018  . Anemia, unspecified 02/23/2018  . Arthritis 02/23/2018  . Congestive heart failure with left ventricular systolic dysfunction ([place]) 02/23/2018  . Diabetes mellitus type 2, uncomplicated ([place]) 45/40/9811  . Gout 02/23/2018  . Cause of injury, fall 02/13/2018  . Cardiac syncope 11/26/2017  . Sick sinus syndrome ([place]) 08/01/2016  . Chest wall pain 05/30/2016  . B12 deficiency 04/25/2016  . Edema of right lower extremity 01/10/2016  . Chronic a-fib 09/07/2014    Past Surgical History:  Procedure Laterality Date  . BREAST SURGERY Left 08/1997   Dr Smith/mastectomy  . CATARACT EXTRACTION  2000  . EYE SURGERY Bilateral    Cataract Extraction with IOL  . IVC FILTER INSERTION N/A 03/26/2018   Procedure: IVC FILTER INSERTION;  Surgeon: Katha Cabal, MD;  Location: [facility];  Service: Cardiovascular;  Laterality: N/A;  . MASTECTOMY Right 02/19/1997   Dr Bary Castilla  . MASTECTOMY Left 1999   Dr. Rochel Brome[facility]  . PACEMAKER INSERTION Left 08/01/2016   Procedure: INSERTION PACEMAKER;  Surgeon: Isaias Cowman, MD;  Location: [facility] ORS;  Service: Cardiovascular;  Laterality: Left;    Prior to Admission  medications   Medication Sig Start Date End Date Taking? Authorizing Provider  acetaminophen (TYLENOL) 500 MG tablet Take 1,000 mg by mouth every 6 (six) hours as needed for mild pain.   Yes [provider]  apixaban (ELIQUIS) 5 MG TABS tablet Take 1 tablet (5 mg total) by mouth 2 (two) times daily. 06/26/18  Yes Donzetta Starch, NP  cetirizine (ZYRTEC) 10 MG tablet Take 10 mg by mouth daily as needed for allergies.    Yes [provider]  cholecalciferol (VITAMIN D) 1000 units tablet Take 2 tablets by mouth daily.   Yes [provider]  donepezil (ARICEPT) 10 MG tablet Take 10 mg by mouth at bedtime.    Yes [provider]  lamoTRIgine (LAMICTAL) 25 MG tablet Take 25 mg by mouth 2 (two) times daily.  01/11/18  Yes [provider]  levETIRAcetam (KEPPRA) 750 MG tablet Take 750 mg by mouth 2 (two) times daily. 03/12/18  Yes [provider]  loratadine (CLARITIN) 10 MG tablet Take 10 mg by mouth daily as needed for allergies.    Yes [provider]  lovastatin (MEVACOR) 40 MG tablet Take 40 mg by mouth at bedtime.   Yes [provider]  memantine (NAMENDA) 5 MG tablet Take 5 mg by mouth 2 (two) times daily. 12/24/17  Yes [provider]  metoprolol tartrate (LOPRESSOR) 25 MG tablet Take 0.5 tablets (12.5 mg total) by mouth 2 (two) times daily. 06/26/18  Yes Donzetta Starch, NP  Polyethylene Glycol 3350 (PEG 3350) POWD Take 17 g by mouth daily as needed (constipation).    Yes [provider]  QUEtiapine (SEROQUEL) 50 MG tablet Take 50 mg by mouth at bedtime. 10/30/17  Yes [provider]  senna-docusate (SENOKOT-S) 8.6-50 MG tablet Take 1 tablet by mouth daily.   Yes [provider]  traMADol (ULTRAM) 50 MG tablet Take 25 mg by mouth every 4 (four) hours as needed for moderate pain.    Yes [provider]  venlafaxine XR (EFFEXOR-XR) 37.5 MG 24 hr capsule Take 37.5 mg by mouth daily. 12/31/17  Yes [provider]  vitamin B-12 (CYANOCOBALAMIN) 1000 MCG tablet Take 1,000 mcg by mouth daily.   Yes [provider]  ciprofloxacin (CIPRO) 250 MG tablet Take 1 tablet (250 mg total) by mouth 2 (two) times daily. Patient not taking: Reported on 07/17/2018 06/26/18   Donzetta Starch, NP  colchicine 0.6 MG tablet Take 0.6 mg by mouth as needed (Gout).     [provider]    Allergies  Allergen Reactions  . Codeine Nausea Only    Family History  Problem Relation Age of Onset  . Hypertension Father     Social  History Social History   Tobacco Use  . Smoking status: Never Smoker  . Smokeless tobacco: Never Used  Substance Use Topics  . Alcohol use: No  . Drug use: No    Review of Systems  Constitutional: Negative for fever. Eyes: Negative for visual changes. ENT: Negative for sore throat. Cardiovascular: Negative for chest pain. Respiratory: Negative for shortness of breath. Gastrointestinal: Like she did complain of some abdominal pain earlier but no abdominal pain now. Genitourinary: Negative for dysuria. Musculoskeletal: Negative for back pain. Skin: Negative for rash. Neurological: Negative for headache.  ____________________________________________   PHYSICAL EXAM:  VITAL SIGNS: ED Triage Vitals  Enc Vitals Group     BP 07/17/18 1219 (!) 174/79     Pulse Rate 07/17/18 1219  74     Resp 07/17/18 1219 19     Temp 07/17/18 1219 98.1 F (36.7 C)     Temp Source 07/17/18 1219 Oral     SpO2 07/17/18 1219 97 %     Weight 07/17/18 1220 140 lb (63.5 kg)     Height 07/17/18 1220 5\' 3"  (1.6 m)     Head Circumference --      Peak Flow --      Pain Score 07/17/18 1219 5     Pain Loc --      Pain Edu? --      Excl. in [place]? --      Constitutional: Alert and cooperative but disoriented, has dementia.  HEENT      Head: Normocephalic and atraumatic.      Eyes: Conjunctivae are normal. Pupils equal and round.       Ears:         Nose: No congestion/rhinnorhea.      Mouth/Throat: Mucous membranes are moist.      Neck: No stridor. Cardiovascular/Chest: Normal rate, regular rhythm.  No murmurs, rubs, or gallops. Respiratory: Normal respiratory effort without tachypnea nor retractions. Breath sounds are clear and equal bilaterally. No wheezes/rales/rhonchi. Gastrointestinal: Soft. No distention, no guarding, no rebound. Nontender.    Genitourinary/rectal: Few external hemorrhoids which are nonthrombosed.  No mass on digital rectal exam.  Small brown stool without gross  blood. Musculoskeletal: Nontender with normal range of motion in all extremities. No joint effusions.  No lower extremity tenderness.  No edema. Neurologic: No facial droop.  Normal speech and language. No gross or focal neurologic deficits are appreciated. Skin:  Skin is warm, dry and intact. No rash noted. Psychiatric: No agitation.   ____________________________________________  LABS (pertinent positives/negatives) I, Lisa Roca, MD the attending physician have reviewed the labs noted below.  Labs Reviewed  COMPREHENSIVE METABOLIC PANEL - Abnormal; Notable for the following components:      Result Value   Potassium 2.9 (*)    Calcium 8.4 (*)    Albumin 3.1 (*)    All other components within normal limits  CBC WITH DIFFERENTIAL/PLATELET - Abnormal; Notable for the following components:   Hemoglobin 10.6 (*)    HCT 34.5 (*)    MCH 25.2 (*)    RDW 17.0 (*)    All other components within normal limits  TYPE AND SCREEN    ____________________________________________    EKG I, Lisa Roca, MD, the attending physician have personally viewed and interpreted all ECGs.  None ____________________________________________  RADIOLOGY   None __________________________________________  PROCEDURES  Procedure(s) performed: None  Procedures  Critical Care performed: None   ____________________________________________  ED COURSE / ASSESSMENT AND PLAN  Pertinent labs & imaging results that were available during my care of the patient were reviewed by me and considered in my medical decision making (see chart for details).     Patient here for evaluation of rectal bleeding, bright red blood per rectum associated when she was having bowel movement.  She was just recently put back on blood thinner Eliquis.  On exam here no active bleeding on exam.  It actually sounds like what the daughter is describing at home was potentially rectal prolapse which is now  relieved.  Stable vital signs.  Discussed with the daughter that 24-hour observation is probably warranted given blood thinner and rectal bleeding.  I am most suspicious that the bleeding may have been coming from potentially internal hemorrhoids.  Less suspicious for upper  GI bleed based on description of bright red blood per rectum.  Hemoglobin 10.6, up from 9 at the end of October.  Again vital signs stable.  No additional episodes here in the ED.  She is hypokalemic and was given p.o. repletion.  Discussed with hospitalist for admission.    CONSULTATIONS:   Hospitalist for admission.   Patient / Family / Caregiver informed of clinical course, medical decision-making process, and agree with plan.    ___________________________________________   FINAL CLINICAL IMPRESSION(S) / ED DIAGNOSES   Final diagnoses:  Rectal bleeding  Hypokalemia      ___________________________________________         Note: This dictation was prepared with Dragon dictation. Any transcriptional errors that result from this process are unintentional    Lisa Roca, MD 07/17/18 1408

## 2018-07-17 NOTE — ED Notes (Signed)
Report given to Georgie RN 

## 2018-07-17 NOTE — ED Notes (Signed)
Pt resting comfortably.  Daughter at bedside.

## 2018-07-17 NOTE — ED Notes (Signed)
IV team at bedside 

## 2018-07-17 NOTE — Progress Notes (Signed)
Family Meeting Note  Advance Directive:yes  Today a meeting took place with the daughter.  Patient is unable to participate due TK:ZSWFUX capacity dementia   The following clinical team members were present during this meeting:MD  The following were discussed:Patient's diagnosis:rectal bleed , Patient's progosis: Unable to determine and Goals for treatment: DNR  Additional follow-up to be provided: DNR Outpatient palliative care consult at discharge  Time spent during discussion:16 minutes  Cherise Fedder, Ulice Bold, MD

## 2018-07-17 NOTE — Patient Outreach (Addendum)
[facility]) Care Management  07/17/2018  Shelley Cameron [birth year] [id number]   Care coordination  [facility] RN CM collaborated with [place], and received an update on the [facility] SW interventions completed for transportation to medical appointments. Refer to [facility] SW notes. SW has closed case [facility] SW confirms Shelley Cameron's daughter, Shelley Cameron reports her neurology appointment is scheduled with Shelley Cameron on July 29 2018 vs July 31 2018 as CM had been informed. CM also verified 07/29/18 as correct date via care everywhere   Plan [facility] RN CM will follow up with Shelley Omura and Shelley Cameron within 14 days to follow up neurology appointment  [place]. Lavina Hamman, RN, BSN, [place] Coordinator Office number [phone] Mobile number [phone]  Main [facility] number [phone] Fax number [phone]

## 2018-07-17 NOTE — ED Notes (Signed)
Pt cleaned up with assistance from [employer]. Pt repositioned in bed and resting comfortably at this time.

## 2018-07-17 NOTE — ED Notes (Signed)
Unable to get IV access at this time. Able to collect blood tubes full rainbow and type and screen sent to lab

## 2018-07-17 NOTE — ED Notes (Signed)
Family reports that pt had bright red large amounts of blood that started last night. Pt was having bowel movement when family noticed it. Family also reports growth in pt's rectum that is pink in coloration and states it went back in there. States possible hemorrhoids.

## 2018-07-17 NOTE — H&P (Signed)
[facility] [place] NAME: Shelley Cameron    MR#:  [id number]  DATE OF BIRTH:  08/16/1930  DATE OF ADMISSION:  07/17/2018  PRIMARY CARE PHYSICIAN: Clovia Cuff, MD   REQUESTING/REFERRING PHYSICIAN: dr Reita Cliche  CHIEF COMPLAINT:   Rectal bleed HISTORY OF PRESENT ILLNESS:  Shelley Cameron  is a 84 y.o. female with a known history of dementia, breast cancer status post double mastectomy, PAF, recent stroke about 3 weeks ago, traumatic SAH, bilateral DVTs and PE who was recently restarted on anticoagulation after her most recent stroke who presents to the hospital due to rectal bleed.  Patient's daughter is at bedside and reports that yesterday she had several bowel movements with bright red blood per rectum.  Daughter was concerned so she was brought to the ER for further evaluation.  In the emergency room her guaiac test was negative.  She has brown stool.  Her vitals are stable.  She is essentially bedbound from previous stroke.  PAST MEDICAL HISTORY:   Past Medical History:  Diagnosis Date  . A-fib ([place])   . Anemia   . Arthritis   . Cancer ([place]) 1998, 1999   breast cancer. Bilateral mastectomy.   . Diabetes mellitus without complication ([place])   . DJD (degenerative joint disease)   . Gout   . Heart murmur   . Hyperlipidemia   . Hypertension   . Lymphedema of right arm   . Moderate dementia ([place])   . Seizures ([place])    Tonic-Clonic seizure activity  . Severe mitral insufficiency   . Stroke [facility]) 2011  . Syncope   . Systolic CHF ([place])   . Urinary incontinence     PAST SURGICAL HISTORY:   Past Surgical History:  Procedure Laterality Date  . BREAST SURGERY Left 08/1997   Dr Smith/mastectomy  . CATARACT EXTRACTION  2000  . EYE SURGERY Bilateral    Cataract Extraction with IOL  . IVC FILTER INSERTION N/A 03/26/2018   Procedure: IVC FILTER INSERTION;  Surgeon: Katha Cabal, MD;  Location: [facility];  Service:  Cardiovascular;  Laterality: N/A;  . MASTECTOMY Right 02/19/1997   Dr Bary Castilla  . MASTECTOMY Left 1999   Dr. Rochel Brome, [facility]  . PACEMAKER INSERTION Left 08/01/2016   Procedure: INSERTION PACEMAKER;  Surgeon: Isaias Cowman, MD;  Location: [facility] ORS;  Service: Cardiovascular;  Laterality: Left;    SOCIAL HISTORY:   Social History   Tobacco Use  . Smoking status: Never Smoker  . Smokeless tobacco: Never Used  Substance Use Topics  . Alcohol use: No    FAMILY HISTORY:   Family History  Problem Relation Age of Onset  . Hypertension Father     DRUG ALLERGIES:   Allergies  Allergen Reactions  . Codeine Nausea Only    REVIEW OF SYSTEMS:   Review of Systems  Unable to perform ROS: Dementia    MEDICATIONS AT HOME:   Prior to Admission medications   Medication Sig Start Date End Date Taking? Authorizing Provider  acetaminophen (TYLENOL) 500 MG tablet Take 1,000 mg by mouth every 6 (six) hours as needed for mild pain.   Yes [provider]  apixaban (ELIQUIS) 5 MG TABS tablet Take 1 tablet (5 mg total) by mouth 2 (two) times daily. 06/26/18  Yes Donzetta Starch, NP  cetirizine (ZYRTEC) 10 MG tablet Take 10 mg by mouth daily as needed for allergies.    Yes [provider]  cholecalciferol (  VITAMIN D) 1000 units tablet Take 2 tablets by mouth daily.   Yes [provider]  donepezil (ARICEPT) 10 MG tablet Take 10 mg by mouth at bedtime.    Yes [provider]  lamoTRIgine (LAMICTAL) 25 MG tablet Take 25 mg by mouth 2 (two) times daily.  01/11/18  Yes [provider]  levETIRAcetam (KEPPRA) 750 MG tablet Take 750 mg by mouth 2 (two) times daily. 03/12/18  Yes [provider]  loratadine (CLARITIN) 10 MG tablet Take 10 mg by mouth daily as needed for allergies.    Yes [provider]  lovastatin (MEVACOR) 40 MG tablet Take 40 mg by mouth at bedtime.   Yes [provider]  memantine (NAMENDA) 5 MG tablet  Take 5 mg by mouth 2 (two) times daily. 12/24/17  Yes [provider]  metoprolol tartrate (LOPRESSOR) 25 MG tablet Take 0.5 tablets (12.5 mg total) by mouth 2 (two) times daily. 06/26/18  Yes Donzetta Starch, NP  Polyethylene Glycol 3350 (PEG 3350) POWD Take 17 g by mouth daily as needed (constipation).    Yes [provider]  QUEtiapine (SEROQUEL) 50 MG tablet Take 50 mg by mouth at bedtime. 10/30/17  Yes [provider]  senna-docusate (SENOKOT-S) 8.6-50 MG tablet Take 1 tablet by mouth daily.   Yes [provider]  traMADol (ULTRAM) 50 MG tablet Take 25 mg by mouth every 4 (four) hours as needed for moderate pain.    Yes [provider]  venlafaxine XR (EFFEXOR-XR) 37.5 MG 24 hr capsule Take 37.5 mg by mouth daily. 12/31/17  Yes [provider]  vitamin B-12 (CYANOCOBALAMIN) 1000 MCG tablet Take 1,000 mcg by mouth daily.   Yes [provider]  ciprofloxacin (CIPRO) 250 MG tablet Take 1 tablet (250 mg total) by mouth 2 (two) times daily. Patient not taking: Reported on 07/17/2018 06/26/18   Donzetta Starch, NP  colchicine 0.6 MG tablet Take 0.6 mg by mouth as needed (Gout).     [provider]      VITAL SIGNS:  Blood pressure (!) 174/79, pulse 74, temperature 98.1 F (36.7 C), temperature source Oral, resp. rate 19, height 5\' 3"  (1.6 m), weight 63.5 kg, SpO2 97 %.  PHYSICAL EXAMINATION:   Physical Exam  Constitutional: She appears well-developed and well-nourished. No distress.  HENT:  Head: Normocephalic.  Eyes: EOM are normal. No scleral icterus.  Neck: Normal range of motion. Neck supple. No JVD present. No tracheal deviation present.  Cardiovascular: Normal rate, regular rhythm and normal heart sounds. Exam reveals no gallop and no friction rub.  No murmur heard. Pulmonary/Chest: Effort normal and breath sounds normal. No respiratory distress. She has no wheezes. She has no rales. She exhibits no tenderness.   Abdominal: Soft. Bowel sounds are normal. She exhibits no distension and no mass. There is no tenderness. There is no rebound and no guarding.  Musculoskeletal: Normal range of motion. She exhibits no edema.  Neurological: She is alert.  She has 0 out of 5 lower extremity strength  Skin: Skin is warm. Rash noted. No erythema.  She has a sacral ulcer  Psychiatric:  Patient pleasantly confused with dementia      LABORATORY PANEL:   CBC Recent Labs  Lab 07/17/18 1239  WBC 8.6  HGB 10.6*  HCT 34.5*  PLT 316   ------------------------------------------------------------------------------------------------------------------  Chemistries  Recent Labs  Lab 07/17/18 1239  NA 143  K 2.9*  CL 103  CO2 29  GLUCOSE  88  BUN 9  CREATININE 0.74  CALCIUM 8.4*  AST 23  ALT 22  ALKPHOS 105  BILITOT 0.5   ------------------------------------------------------------------------------------------------------------------  Cardiac Enzymes No results for input(s): TROPONINI in the last 168 hours. ------------------------------------------------------------------------------------------------------------------  RADIOLOGY:  No results found.  EKG:   none  IMPRESSION AND PLAN:   84 year old female with a history of SAH taken off anticoagulation and most recent stroke restarted on Eliquis who presents with rectal bleed.  1.  Rectal bleeding: Due to patient's recent stroke and comorbidities at this time we will continue with conservative management. GI bleeding scan ordered Hemoglobin every 6 hours and transfuse if hemoglobin less than 8. This was discussed with the patient's daughter who is in agreement If patient's vitals become unstable or hemoglobin drop significantly or patient has ongoing GI bleed then consider GI consultation. Stop Eliquis 2.  Recent CVA/bedbound status: We will stop Eliquis at this time.  She is high risk to restart anticoagulation.  This was discussed  with the patient's daughter who is in agreement.  3. Chronic AF/pacemaker/ history of PE and DVT: Plan as above to stop Eliquis at this time Continue metoprolol for heart rate control If needed consult [facility] cardiology   4.  Dementia: Continue donepezil and Namenda  5.  Chronic kidney disease stage III: Creatinine is stable  6.  Diabetes: Sliding scale initiated All the records are reviewed and case discussed with ED provider. Management plans discussed with the patient's daughter and she is in agreement  CODE STATUS: DNR  TOTAL TIME TAKING CARE OF THIS PATIENT: 50 minutes.    Olanda Boughner M.D on 07/17/2018 at 2:22 PM  Between 7am to 6pm - Pager - [phone]  After 6pm go to [email or web address] - password [email or web address] [place] [facility]  Office  [phone]  CC: Primary care physician; Clovia Cuff, MD

## 2018-07-18 ENCOUNTER — Observation Stay: Payer: Medicare HMO

## 2018-07-18 DIAGNOSIS — M19072 Primary osteoarthritis, left ankle and foot: Secondary | ICD-10-CM | POA: Diagnosis not present

## 2018-07-18 DIAGNOSIS — K922 Gastrointestinal hemorrhage, unspecified: Secondary | ICD-10-CM | POA: Diagnosis not present

## 2018-07-18 DIAGNOSIS — M6281 Muscle weakness (generalized): Secondary | ICD-10-CM | POA: Diagnosis not present

## 2018-07-18 DIAGNOSIS — Z743 Need for continuous supervision: Secondary | ICD-10-CM | POA: Diagnosis not present

## 2018-07-18 DIAGNOSIS — K625 Hemorrhage of anus and rectum: Secondary | ICD-10-CM | POA: Diagnosis not present

## 2018-07-18 DIAGNOSIS — R5381 Other malaise: Secondary | ICD-10-CM | POA: Diagnosis not present

## 2018-07-18 DIAGNOSIS — N183 Chronic kidney disease, stage 3 (moderate): Secondary | ICD-10-CM | POA: Diagnosis not present

## 2018-07-18 DIAGNOSIS — E119 Type 2 diabetes mellitus without complications: Secondary | ICD-10-CM | POA: Diagnosis not present

## 2018-07-18 DIAGNOSIS — F0390 Unspecified dementia without behavioral disturbance: Secondary | ICD-10-CM | POA: Diagnosis not present

## 2018-07-18 DIAGNOSIS — I482 Chronic atrial fibrillation, unspecified: Secondary | ICD-10-CM | POA: Diagnosis not present

## 2018-07-18 DIAGNOSIS — G40401 Other generalized epilepsy and epileptic syndromes, not intractable, with status epilepticus: Secondary | ICD-10-CM | POA: Diagnosis not present

## 2018-07-18 DIAGNOSIS — I82493 Acute embolism and thrombosis of other specified deep vein of lower extremity, bilateral: Secondary | ICD-10-CM | POA: Diagnosis not present

## 2018-07-18 LAB — BASIC METABOLIC PANEL
Anion gap: 7 (ref 5–15)
BUN: 8 mg/dL (ref 8–23)
CHLORIDE: 105 mmol/L (ref 98–111)
CO2: 31 mmol/L (ref 22–32)
Calcium: 8.1 mg/dL — ABNORMAL LOW (ref 8.9–10.3)
Creatinine, Ser: 0.79 mg/dL (ref 0.44–1.00)
GFR calc Af Amer: >60 mL/min (ref >60)
GFR calc non Af Amer: >60 mL/min (ref >60)
GLUCOSE: 88 mg/dL (ref 70–99)
POTASSIUM: 3.5 mmol/L (ref 3.5–5.1)
Sodium: 143 mmol/L (ref 135–145)

## 2018-07-18 LAB — CBC
HEMATOCRIT: 33.6 % — AB (ref 36.0–46.0)
HEMOGLOBIN: 10.7 g/dL — AB (ref 12.0–15.0)
MCH: 25.4 pg — AB (ref 26.0–34.0)
MCHC: 31.8 g/dL (ref 30.0–36.0)
MCV: 79.8 fL — AB (ref 80.0–100.0)
Platelets: 293 10*3/uL (ref 150–400)
RBC: 4.21 MIL/uL (ref 3.87–5.11)
RDW: 16.7 % — ABNORMAL HIGH (ref 11.5–15.5)
WBC: 6.8 10*3/uL (ref 4.0–10.5)
nRBC: 0 % (ref 0.0–0.2)

## 2018-07-18 LAB — GLUCOSE, CAPILLARY
GLUCOSE-CAPILLARY: 96 mg/dL (ref 70–99)
Glucose-Capillary: 78 mg/dL (ref 70–99)

## 2018-07-18 LAB — HEMOGLOBIN: Hemoglobin: 10.2 g/dL — ABNORMAL LOW (ref 12.0–15.0)

## 2018-07-18 NOTE — Discharge Summary (Signed)
[facility] [place] NAME: Shelley Cameron    MR#:  [id number]  DATE OF BIRTH:  [birth year]  DATE OF ADMISSION:  07/17/2018 ADMITTING PHYSICIAN: Bettey Costa, MD  DATE OF DISCHARGE: 07/18/2018  PRIMARY CARE PHYSICIAN: Clovia Cuff, MD    ADMISSION DIAGNOSIS:  Hypokalemia [E87.6] Rectal bleeding [K62.5]  DISCHARGE DIAGNOSIS:  Active Problems:   GIB (gastrointestinal bleeding)   SECONDARY DIAGNOSIS:   Past Medical History:  Diagnosis Date  . A-fib ([place])   . Anemia   . Arthritis   . Cancer ([place]) 1998, 1999   breast cancer. Bilateral mastectomy.   . Diabetes mellitus without complication ([place])   . DJD (degenerative joint disease)   . Gout   . Heart murmur   . Hyperlipidemia   . Hypertension   . Lymphedema of right arm   . Moderate dementia ([place])   . Seizures ([place])    Tonic-Clonic seizure activity  . Severe mitral insufficiency   . Stroke [facility]) 2011  . Syncope   . Systolic CHF ([place])   . Urinary incontinence     HOSPITAL COURSE:   84 year old female with past medical history of DVT/PE status post IVC filter, history of breast cancer, diabetes, degenerative disc disease, hypertension, hyperlipidemia, dementia, history of seizures who presented to the hospital due to rectal bleeding.  1.  Rectal bleeding- patient presented to the hospital rectal bleeding and was observed overnight in the hospital.  Patient's hemoglobin remained stable, she was on Eliquis for recent stroke which was held. -Patient had no further rectal bleeding while in the hospital and therefore is being discharged home.  She is tolerating p.o. well.  She will be off anticoagulants indefinitely.  2.  History of previous DVT/PE- given her high risk for bleeding she has been taken off anticoagulants. -She is status post IVC filter.  3.  History of previous CVA- patient was on Eliquis but that has been discontinued due to the rectal bleeding. - Advised patient's  daughter that she can use baby aspirin for now. -Continue lovastatin  4.  History of previous seizures- continue Keppra.  5.  History of dementia-patient will continue Aricept.  6.  Essential hypertension-patient will continue her metoprolol.  7.  Depression- pt. Will continue Effexor.  Pt. Is being discharged home with Home Health PT, Aide, Social Work  DISCHARGE CONDITIONS:   Stable.   CONSULTS OBTAINED:    DRUG ALLERGIES:   Allergies  Allergen Reactions  . Codeine Nausea Only    DISCHARGE MEDICATIONS:   Allergies as of 07/18/2018      Reactions   Codeine Nausea Only      Medication List    STOP taking these medications   apixaban 5 MG Tabs tablet Commonly known as:  ELIQUIS   ciprofloxacin 250 MG tablet Commonly known as:  CIPRO     TAKE these medications   acetaminophen 500 MG tablet Commonly known as:  TYLENOL Take 1,000 mg by mouth every 6 (six) hours as needed for mild pain.   cetirizine 10 MG tablet Commonly known as:  ZYRTEC Take 10 mg by mouth daily as needed for allergies.   cholecalciferol 1000 units tablet Commonly known as:  VITAMIN D Take 2 tablets by mouth daily.   colchicine 0.6 MG tablet Take 0.6 mg by mouth as needed (Gout).   donepezil 10 MG tablet Commonly known as:  ARICEPT Take 10 mg by mouth at bedtime.   lamoTRIgine 25 MG tablet Commonly known  as:  LAMICTAL Take 25 mg by mouth 2 (two) times daily.   levETIRAcetam 750 MG tablet Commonly known as:  KEPPRA Take 750 mg by mouth 2 (two) times daily.   loratadine 10 MG tablet Commonly known as:  CLARITIN Take 10 mg by mouth daily as needed for allergies.   lovastatin 40 MG tablet Commonly known as:  MEVACOR Take 40 mg by mouth at bedtime.   memantine 5 MG tablet Commonly known as:  NAMENDA Take 5 mg by mouth 2 (two) times daily.   metoprolol tartrate 25 MG tablet Commonly known as:  LOPRESSOR Take 0.5 tablets (12.5 mg total) by mouth 2 (two) times daily.   PEG  3350 Powd Take 17 g by mouth daily as needed (constipation).   QUEtiapine 50 MG tablet Commonly known as:  SEROQUEL Take 50 mg by mouth at bedtime.   senna-docusate 8.6-50 MG tablet Commonly known as:  Senokot-S Take 1 tablet by mouth daily.   traMADol 50 MG tablet Commonly known as:  ULTRAM Take 25 mg by mouth every 4 (four) hours as needed for moderate pain.   venlafaxine XR 37.5 MG 24 hr capsule Commonly known as:  EFFEXOR-XR Take 37.5 mg by mouth daily.   vitamin B-12 1000 MCG tablet Commonly known as:  CYANOCOBALAMIN Take 1,000 mcg by mouth daily.         DISCHARGE INSTRUCTIONS:   DIET:  Cardiac diet  DISCHARGE CONDITION:  Stable  ACTIVITY:  Activity as tolerated  OXYGEN:  Home Oxygen: No.   Oxygen Delivery: room air  DISCHARGE LOCATION:  Home with [place], Aide, Social Work.    If you experience worsening of your admission symptoms, develop shortness of breath, life threatening emergency, suicidal or homicidal thoughts you must seek medical attention immediately by calling 911 or calling your MD immediately  if symptoms less severe.  You Must read complete instructions/literature along with all the possible adverse reactions/side effects for all the Medicines you take and that have been prescribed to you. Take any new Medicines after you have completely understood and accpet all the possible adverse reactions/side effects.   Please note  You were cared for by a hospitalist during your hospital stay. If you have any questions about your discharge medications or the care you received while you were in the hospital after you are discharged, you can call the unit and asked to speak with the hospitalist on call if the hospitalist that took care of you is not available. Once you are discharged, your primary care physician will handle any further medical issues. Please note that NO REFILLS for any discharge medications will be authorized once you are  discharged, as it is imperative that you return to your primary care physician (or establish a relationship with a primary care physician if you do not have one) for your aftercare needs so that they can reassess your need for medications and monitor your lab values.     Today   No acute events overnight, hemoglobin stable.  Further rectal bleeding.  Discussed with daughter and patient to stay off anticoagulants indefinitely.  Complaining of some left foot pain x-rays of the left foot are negative for acute abnormality.  VITAL SIGNS:  Blood pressure (!) 150/74, pulse 70, temperature (!) 96.5 F (35.8 C), temperature source Axillary, resp. rate 18, height 5\' 3"  (1.6 m), weight 63.5 kg, SpO2 100 %.  I/O:    Intake/Output Summary (Last 24 hours) at 07/18/2018 1355 Last data filed at  07/18/2018 1039 Gross per 24 hour  Intake 123 ml  Output 500 ml  Net -377 ml    PHYSICAL EXAMINATION:  GENERAL:  84 y.o.-year-old patient lying in the bed with no acute distress.  EYES: Pupils equal, round, reactive to light and accommodation. No scleral icterus. Extraocular muscles intact.  HEENT: Head atraumatic, normocephalic. Oropharynx and nasopharynx clear.  NECK:  Supple, no jugular venous distention. No thyroid enlargement, no tenderness.  LUNGS: Normal breath sounds bilaterally, no wheezing, rales,rhonchi. No use of accessory muscles of respiration.  CARDIOVASCULAR: S1, S2 normal. No murmurs, rubs, or gallops.  ABDOMEN: Soft, non-tender, non-distended. Bowel sounds present. No organomegaly or mass.  EXTREMITIES: No pedal edema, cyanosis, or clubbing.  NEUROLOGIC: Cranial nerves II through XII are intact. Globally weak w/ dense left sided weakness from previous CVA.  PSYCHIATRIC: The patient is alert and oriented x 1.  SKIN: No obvious rash, lesion, or ulcer.   DATA REVIEW:   CBC Recent Labs  Lab 07/18/18 0548 07/18/18 1223  WBC 6.8  --   HGB 10.7* 10.2*  HCT 33.6*  --   PLT 293  --      Chemistries  Recent Labs  Lab 07/17/18 1239 07/18/18 0548  NA 143 143  K 2.9* 3.5  CL 103 105  CO2 29 31  GLUCOSE 88 88  BUN 9 8  CREATININE 0.74 0.79  CALCIUM 8.4* 8.1*  AST 23  --   ALT 22  --   ALKPHOS 105  --   BILITOT 0.5  --     Cardiac Enzymes No results for input(s): TROPONINI in the last 168 hours.    RADIOLOGY:  Dg Foot Complete Left  Result Date: 07/18/2018 CLINICAL DATA:  Left foot pain EXAM: LEFT FOOT - COMPLETE 3+ VIEW COMPARISON:  09/28/2015 FINDINGS: Mild degenerative changes at the 1st MTP joint. Mild osteoarthritic changes in the tarsals. No acute bony abnormality. Specifically, no fracture, subluxation, or dislocation. Soft tissues are intact. IMPRESSION: Mild osteoarthritic changes, similar prior study. No acute bony abnormality. Electronically Signed   By: Rolm Baptise M.D.   On: 07/18/2018 10:51      Management plans discussed with the patient, family and they are in agreement.  CODE STATUS:     Code Status Orders  (From admission, onward)         Start     Ordered   07/17/18 1708  Do not attempt resuscitation (DNR)  Continuous    Question Answer Comment  In the event of cardiac or respiratory ARREST Do not call a "code blue"   In the event of cardiac or respiratory ARREST Do not perform Intubation, CPR, defibrillation or ACLS   In the event of cardiac or respiratory ARREST Use medication by any route, position, wound care, and other measures to relive pain and suffering. May use oxygen, suction and manual treatment of airway obstruction as needed for comfort.   Comments DNR/DNI after speaking with daughter      07/17/18 1708        Code Status History    Date Active Date Inactive Code Status Order ID Comments User Context   07/17/2018 1708 07/17/2018 1708 DNR [id number]  Bettey Costa, MD Inpatient    TOTAL TIME TAKING CARE OF THIS PATIENT: 40 minutes.    Henreitta Leber M.D on 07/18/2018 at 1:55 PM  Between 7am to 6pm - Pager  - [phone]  After 6pm go to [email or web address] - [place] [facility]  Office  [phone]  CC: Primary care physician; Clovia Cuff, MD

## 2018-07-18 NOTE — Progress Notes (Addendum)
Discharge teaching given to patient's daughter [name] , patient's daughter verbalized understanding and had no questions. Patient IV removed. Patient will be transported home by EMS. All patient belongings gathered prior to leaving.

## 2018-07-18 NOTE — Discharge Instructions (Signed)
Gastrointestinal Bleeding °Gastrointestinal bleeding is bleeding somewhere along the path food travels through the body (digestive tract). This path is anywhere between the mouth and the opening of the butt (anus). You may have blood in your poop (stools) or have black poop. If you throw up (vomit), there may be blood in it. °This condition can be mild, serious, or even life-threatening. If you have a lot of bleeding, you may need to stay in the hospital. °Follow these instructions at home: °· Take over-the-counter and prescription medicines only as told by your doctor. °· Eat foods that have a lot of fiber in them. These foods include whole grains, fruits, and vegetables. You can also try eating 1-3 prunes each day. °· Drink enough fluid to keep your pee (urine) clear or pale yellow. °· Keep all follow-up visits as told by your doctor. This is important. °Contact a doctor if: °· Your symptoms do not get better. °Get help right away if: °· Your bleeding gets worse. °· You feel dizzy or you pass out (faint). °· You feel weak. °· You have very bad cramps in your back or belly (abdomen). °· You pass large clumps of blood (clots) in your poop. °· Your symptoms are getting worse. °This information is not intended to replace advice given to you by your health care provider. Make sure you discuss any questions you have with your health care provider. °Document Released: 05/22/2008 Document Revised: 01/19/2016 Document Reviewed: 01/31/2015 °Elsevier Interactive Patient Education © 2018 [employer] ° °

## 2018-07-18 NOTE — Evaluation (Signed)
Clinical/Bedside Swallow Evaluation Patient Details  Name: Shelley Cameron MRN: [id number] Date of Birth: 10-May-1930  Today's Date: 07/18/2018 Time: SLP Start Time (ACUTE ONLY): 1110 SLP Stop Time (ACUTE ONLY): 1210 SLP Time Calculation (min) (ACUTE ONLY): 60 min  Past Medical History:  Past Medical History:  Diagnosis Date  . A-fib ([place])   . Anemia   . Arthritis   . Cancer ([place]) 1998, 1999   breast cancer. Bilateral mastectomy.   . Diabetes mellitus without complication ([place])   . DJD (degenerative joint disease)   . Gout   . Heart murmur   . Hyperlipidemia   . Hypertension   . Lymphedema of right arm   . Moderate dementia ([place])   . Seizures ([place])    Tonic-Clonic seizure activity  . Severe mitral insufficiency   . Stroke [facility]) 2011  . Syncope   . Systolic CHF ([place])   . Urinary incontinence    Past Surgical History:  Past Surgical History:  Procedure Laterality Date  . BREAST SURGERY Left 08/1997   Dr Smith/mastectomy  . CATARACT EXTRACTION  2000  . EYE SURGERY Bilateral    Cataract Extraction with IOL  . IVC FILTER INSERTION N/A 03/26/2018   Procedure: IVC FILTER INSERTION;  Surgeon: Katha Cabal, MD;  Location: [place] [facility];  Service: Cardiovascular;  Laterality: N/A;  . MASTECTOMY Right 02/19/1997   Dr Bary Castilla  . MASTECTOMY Left 1999   Dr. Rochel Brome, [facility]  . PACEMAKER INSERTION Left 08/01/2016   Procedure: INSERTION PACEMAKER;  Surgeon: Isaias Cowman, MD;  Location: [facility] ORS;  Service: Cardiovascular;  Laterality: Left;   HPI:  Pt is a 83 y.o. female w/ PMH of Moderate Dementia, breast cancer s/p double mastectomy, a fib ( not on anticoagulation), seizure, stroke ( 2011), HTN, DM, traumatic SAH ~ 4 weeks ago, bilateral DVTs, PE w/ chronic old bilateral MCA infarcts who presented from Home w/ family w/ Daughter reporting that yesterday she had several bowel movements with bright red blood per rectum. Pt is largely bedbound/chair bound at home.  She has been eating "well" per Daughter - "most regular foods that I cut up well". Daughter denies any difficulty swallowing at home.    Assessment / Plan / Recommendation Clinical Impression  Pt appears to present with adequate oropharyngeal phase swallowing function w/ suspected min primary oral dysphagia secondary to cognitive impairments. She has mod-advanced dementia at baseline per chart notes, but per daughter's report, she has been eating a fairly "regular" diet post discharge from [facility] ~4 weeks ago until this admission w/ rectal bleeding. Daughter denied any swallowing problems and stated pt was eating "well" at home w/ meats cut for her. Pt was able to feed self somewhat as well. Pt required positioning support but then fed self w/ setup. Pt consumed po trials of thin liquids, purees, soft solids w/ no overt s/s of aspiration noted; no decline in respiratory effort and no decline in vocal quality in few verbalizations. Swallowing of po's was timely. Pt prefers to use a straw per Daughter's report (as she does at home). ENcouraged pt and Daughter to monitor for Small, Single sips Slowly to lessen risk for aspiration. Oral phase fairly adequate w/ min increased oral phase time noted for bolus management of the increased textured foods. Given time, pt cleared appropriatley. Pt exhibited increased lingual sweeping to finish clearing b/t trials. Educated pt and Daughter on the need to continue such to ensure oral clearing b/t trials. Pt fed self w/  setup(finger foods). Recommend modifying diet to Dysphagia level 3 w/ well-cut meats; thin liquids, Pills given Whole in puree as at home. Supervision and assistance at meals. As Daughter describes pt appears at her baseline, no further skilled ST services are indicated at this time. ST services will be available for any further education if needed while admitted. Daughter agreed stating she helps pt at home w/ her meals and she feels pt is doing "well"  w/ her eating/drinking. Discussed easy to eat foods and foods that have more fluid in them to aid hydration. Encouraged Daughter to f/u w/ a Dietician if needed.  SLP Visit Diagnosis: Dysphagia, oral phase (R13.11)(min)    Aspiration Risk  (reduced following general aspiration precautions)    Diet Recommendation  Dysphagia level 3 w/ well-cut meats, moistened; Thin liquids. General aspiration precautions; feeding support at meals  Medication Administration: Whole meds with puree(for safer swallowing - does adequately w/ this at home)    Other  Recommendations Recommended Consults: (Dietician f/u as needed) Oral Care Recommendations: Oral care BID;Staff/trained caregiver to provide oral care Other Recommendations: (n/a)   Follow up Recommendations None      Frequency and Duration (n/a)  (n/a)       Prognosis Prognosis for Safe Diet Advancement: Good Barriers to Reach Goals: Cognitive deficits;Severity of deficits;Time post onset      Swallow Study   General Date of Onset: 07/17/18 HPI: Pt is a 84 y.o. female w/ PMH of Moderate Dementia, breast cancer s/p double mastectomy, a fib ( not on anticoagulation), seizure, stroke ( 2011), HTN, DM, traumatic SAH ~ 4 weeks ago, bilateral DVTs, PE w/ chronic old bilateral MCA infarcts who presented from Home w/ family w/ Daughter reporting that yesterday she had several bowel movements with bright red blood per rectum. Pt is largely bedbound/chair bound at home. She has been eating "well" per Daughter - "most regular foods that I cut up well". Daughter denies any difficulty swallowing at home.  Type of Study: Bedside Swallow Evaluation Previous Swallow Assessment: 06/21/18 BSE w/ f/u during admission at [facility]; pt discharged on a level 2 minced diet w/ upgrade to more solid foods per Daughter since then Diet Prior to this Study: Dysphagia 2 (chopped);Thin liquids(regular diet at home per Dtr) Temperature Spikes Noted: No(wbc  6.8) Respiratory Status: Room air History of Recent Intubation: No Behavior/Cognition: Cooperative;Pleasant mood;Confused;Distractible;Requires cueing(Awake ) Oral Cavity Assessment: Within Functional Limits Oral Care Completed by SLP: Recent completion by staff Oral Cavity - Dentition: Dentures, top Vision: Functional for self-feeding Self-Feeding Abilities: Able to feed self;Needs assist;Needs set up Patient Positioning: Upright in bed(needed positioning support) Baseline Vocal Quality: Low vocal intensity(w/ the few verbalizations only) Volitional Cough: Cognitively unable to elicit Volitional Swallow: Unable to elicit    Oral/Motor/Sensory Function Overall Oral Motor/Sensory Function: (no unilateral OM weakness noted during boluses)   Ice Chips Ice chips: Not tested   Thin Liquid Thin Liquid: Within functional limits Presentation: Straw;Self Fed(assisted; 7 small, single sips) Oral Phase Impairments: (adequate) Pharyngeal  Phase Impairments: (none)    Nectar Thick Nectar Thick Liquid: Not tested   Honey Thick Honey Thick Liquid: Not tested   Puree Puree: Within functional limits Presentation: Spoon(fed; 1 trial) Oral Phase Impairments: (none) Pharyngeal Phase Impairments: (none)   Solid     Solid: Impaired Presentation: Self Fed(assisted; 5 trials) Oral Phase Impairments: (min increased oral management time w/ trials) Oral Phase Functional Implications: (min increased time) Pharyngeal Phase Impairments: (none) Other Comments: pt then after continued to  feed herself dipping the graham cracker into the applesauce      Orinda Kenner, [place], CCC-SLP Watson,Katherine 07/18/2018,12:43 PM

## 2018-07-18 NOTE — Care Management Note (Signed)
Case Management Note  Patient Details  Name: Shelley Cameron MRN: [id number] Date of Birth: [birth year]   Patient to discharge home today with resumption of home health orders through [facility].  [place] with [facility] notified of discharge.  Patient lives at home with daughter.  Daughter provides 24/7 care. Offered to provide daughter PCS list, however she declines at this time.  Daughter states that eldercare is to come out to the home on Monday to determine if the patient would qualify for charity services. Patient has transport chair, hospital bed, BSC in the home.  Patient previously had a hoyer lift, however it didn't work for their space and returned it.   Patient to transport  Via EMS.  EMS packet and DNR on chart  Subjective/Objective:                    Action/Plan:   Expected Discharge Date:  07/18/18               Expected Discharge Plan:  [place]  In-House Referral:     Discharge planning Services  CM Consult  Post Acute Care Choice:  Home Health, Resumption of Svcs/PTA Provider Choice offered to:  Adult Children  DME Arranged:    DME Agency:     HH Arranged:  RN, Nurse's Aide, Social Work [employer] Agency:  [facility]  Status of Service:  Completed, signed off  If discussed at [employer], dates discussed:    Additional Comments:  Beverly Sessions, RN 07/18/2018, 2:54 PM

## 2018-07-23 ENCOUNTER — Other Ambulatory Visit: Payer: Self-pay | Admitting: *Deleted

## 2018-07-23 NOTE — Patient Outreach (Signed)
[facility]) Care Management  07/23/2018  Shelley Cameron 03-13-1930 [id number]    Phone call from patient's daughter stating that she has spoke with the Neurologist office and they are willing to refill patient's medications as needed without the requirement of a face to face visit. Per patient's daughter, she discussed the fact that patient is bed bound and would need she  non-emergency transport for patient to get to the office.  Per Dr. Lannie Fields  office, they are unable to accommodate a stretcher.  Per patient's daughter, the respite care evaluation did not occur due to the fact patient had to go to the emergency room. She will re-schedule this evaluation for another time.   Plan: Patient's daughter verbalized no additional community resource needs. Patient closed to [place] work at this time.   Shelley Cameron [facility] Care Management [phone]

## 2018-07-24 ENCOUNTER — Emergency Department: Payer: Medicare HMO

## 2018-07-24 ENCOUNTER — Emergency Department
Admission: EM | Admit: 2018-07-24 | Discharge: 2018-07-24 | Disposition: A | Discharge status: Home / Self Care | Payer: Medicare HMO | Attending: Student in an Organized Health Care Education/Training Program | Admitting: Student in an Organized Health Care Education/Training Program

## 2018-07-24 ENCOUNTER — Encounter: Payer: Self-pay | Admitting: Emergency Medicine

## 2018-07-24 ENCOUNTER — Other Ambulatory Visit: Payer: Self-pay

## 2018-07-24 DIAGNOSIS — Z950 Presence of cardiac pacemaker: Secondary | ICD-10-CM | POA: Insufficient documentation

## 2018-07-24 DIAGNOSIS — Z7982 Long term (current) use of aspirin: Secondary | ICD-10-CM | POA: Diagnosis not present

## 2018-07-24 DIAGNOSIS — I5020 Unspecified systolic (congestive) heart failure: Secondary | ICD-10-CM | POA: Insufficient documentation

## 2018-07-24 DIAGNOSIS — F0390 Unspecified dementia without behavioral disturbance: Secondary | ICD-10-CM | POA: Insufficient documentation

## 2018-07-24 DIAGNOSIS — Z853 Personal history of malignant neoplasm of breast: Secondary | ICD-10-CM | POA: Insufficient documentation

## 2018-07-24 DIAGNOSIS — W1789XA Other fall from one level to another, initial encounter: Secondary | ICD-10-CM | POA: Insufficient documentation

## 2018-07-24 DIAGNOSIS — Z79899 Other long term (current) drug therapy: Secondary | ICD-10-CM | POA: Diagnosis not present

## 2018-07-24 DIAGNOSIS — E119 Type 2 diabetes mellitus without complications: Secondary | ICD-10-CM | POA: Diagnosis not present

## 2018-07-24 DIAGNOSIS — Y929 Unspecified place or not applicable: Secondary | ICD-10-CM | POA: Diagnosis not present

## 2018-07-24 DIAGNOSIS — W19XXXA Unspecified fall, initial encounter: Secondary | ICD-10-CM

## 2018-07-24 DIAGNOSIS — R51 Headache: Secondary | ICD-10-CM | POA: Insufficient documentation

## 2018-07-24 DIAGNOSIS — Y939 Activity, unspecified: Secondary | ICD-10-CM | POA: Diagnosis not present

## 2018-07-24 DIAGNOSIS — Z743 Need for continuous supervision: Secondary | ICD-10-CM | POA: Diagnosis not present

## 2018-07-24 DIAGNOSIS — S0990XA Unspecified injury of head, initial encounter: Secondary | ICD-10-CM | POA: Diagnosis not present

## 2018-07-24 DIAGNOSIS — Y999 Unspecified external cause status: Secondary | ICD-10-CM | POA: Insufficient documentation

## 2018-07-24 DIAGNOSIS — I110 Hypertensive heart disease with heart failure: Secondary | ICD-10-CM | POA: Diagnosis not present

## 2018-07-24 DIAGNOSIS — R4182 Altered mental status, unspecified: Secondary | ICD-10-CM | POA: Diagnosis not present

## 2018-07-24 DIAGNOSIS — S098XXA Other specified injuries of head, initial encounter: Secondary | ICD-10-CM | POA: Diagnosis not present

## 2018-07-24 NOTE — ED Notes (Signed)
[facility]  CALLED  FOR  TRANSPORT  HOME 

## 2018-07-24 NOTE — ED Provider Notes (Signed)
[facility] Emergency Department Provider Note    First MD Initiated Contact with Patient 07/24/18 1120     (approximate)  I have reviewed the triage vital signs and the nursing notes.   HISTORY  Chief Complaint Fall  Level V caveat:  Advanced dementia  HPI Shelley Cameron is a 84 y.o. female with a history of dementia previous on regulation that has been stopped secondary to head bleed presents to the ER after the patient was being moved to her wheelchair fell backwards and hit her head.  This was witnessed by family member.  There was no loss of consciousness or change in behavior.  No change in mental status.  Due to her history of head bleeds in the past patient present to the ER due to concern for the same.    Past Medical History:  Diagnosis Date  . A-fib ([place])   . Anemia   . Arthritis   . Cancer ([place]) 1998, 1999   breast cancer. Bilateral mastectomy.   . Diabetes mellitus without complication ([place])   . DJD (degenerative joint disease)   . Gout   . Heart murmur   . Hyperlipidemia   . Hypertension   . Lymphedema of right arm   . Moderate dementia ([place])   . Seizures ([place])    Tonic-Clonic seizure activity  . Severe mitral insufficiency   . Stroke [facility]) 2011  . Syncope   . Systolic CHF ([place])   . Urinary incontinence    Family History  Problem Relation Age of Onset  . Hypertension Father    Past Surgical History:  Procedure Laterality Date  . BREAST SURGERY Left 08/1997   Dr Smith/mastectomy  . CATARACT EXTRACTION  2000  . EYE SURGERY Bilateral    Cataract Extraction with IOL  . IVC FILTER INSERTION N/A 03/26/2018   Procedure: IVC FILTER INSERTION;  Surgeon: Katha Cabal, MD;  Location: [place] CV LAB;  Service: Cardiovascular;  Laterality: N/A;  . MASTECTOMY Right 02/19/1997   Dr Bary Castilla  . MASTECTOMY Left 1999   Dr. Rochel Brome, [facility]  . PACEMAKER INSERTION Left 08/01/2016   Procedure: INSERTION PACEMAKER;  Surgeon:  Isaias Cowman, MD;  Location: [facility] ORS;  Service: Cardiovascular;  Laterality: Left;   Patient Active Problem List   Diagnosis Date Noted  . GIB (gastrointestinal bleeding) 07/17/2018  . Essential hypertension 06/26/2018  . Hyperlipemia 06/26/2018  . Pseudomonas urinary tract infection 06/26/2018  . Dementia ([place]) 06/26/2018  . Pressure injury of skin 06/21/2018  . Stroke ([place]) 06/20/2018  . DVT (deep vein thrombosis) in pregnancy 03/25/2018  . Closed head injury without concussion 02/24/2018  . Anemia, unspecified 02/23/2018  . Arthritis 02/23/2018  . Congestive heart failure with left ventricular systolic dysfunction ([place]) 02/23/2018  . Diabetes mellitus type 2, uncomplicated ([place]) 35/36/1443  . Gout 02/23/2018  . Cause of injury, fall 02/13/2018  . Cardiac syncope 11/26/2017  . Sick sinus syndrome ([place]) 08/01/2016  . Chest wall pain 05/30/2016  . B12 deficiency 04/25/2016  . Edema of right lower extremity 01/10/2016  . Chronic a-fib 09/07/2014      Prior to Admission medications   Medication Sig Start Date End Date Taking? Authorizing Provider  acetaminophen (TYLENOL) 500 MG tablet Take 1,000 mg by mouth every 6 (six) hours as needed for mild pain.   Yes [provider]  aspirin EC 81 MG tablet Take 81 mg by mouth daily.   Yes [provider]  cetirizine (ZYRTEC) 10  MG tablet Take 10 mg by mouth daily as needed for allergies.    Yes [provider]  cholecalciferol (VITAMIN D) 1000 units tablet Take 2 tablets by mouth daily.   Yes [provider]  donepezil (ARICEPT) 10 MG tablet Take 10 mg by mouth at bedtime.    Yes [provider]  lamoTRIgine (LAMICTAL) 25 MG tablet Take 25 mg by mouth 2 (two) times daily.  01/11/18  Yes [provider]  levETIRAcetam (KEPPRA) 750 MG tablet Take 750 mg by mouth 2 (two) times daily. 03/12/18  Yes [provider]  loratadine (CLARITIN) 10 MG tablet Take 10 mg by mouth daily  as needed for allergies.    Yes [provider]  lovastatin (MEVACOR) 40 MG tablet Take 40 mg by mouth at bedtime.   Yes [provider]  memantine (NAMENDA) 5 MG tablet Take 5 mg by mouth 2 (two) times daily. 12/24/17  Yes [provider]  metoprolol tartrate (LOPRESSOR) 25 MG tablet Take 0.5 tablets (12.5 mg total) by mouth 2 (two) times daily. 06/26/18  Yes Donzetta Starch, NP  Polyethylene Glycol 3350 (PEG 3350) POWD Take 17 g by mouth daily as needed (constipation).    Yes [provider]  QUEtiapine (SEROQUEL) 50 MG tablet Take 50 mg by mouth at bedtime. 10/30/17  Yes [provider]  senna-docusate (SENOKOT-S) 8.6-50 MG tablet Take 1 tablet by mouth daily.   Yes [provider]  venlafaxine XR (EFFEXOR-XR) 37.5 MG 24 hr capsule Take 37.5 mg by mouth daily. 12/31/17  Yes [provider]  vitamin B-12 (CYANOCOBALAMIN) 1000 MCG tablet Take 1,000 mcg by mouth daily.   Yes [provider]  colchicine 0.6 MG tablet Take 0.6 mg by mouth as needed (Gout).     [provider]  traMADol (ULTRAM) 50 MG tablet Take 25 mg by mouth every 4 (four) hours as needed for moderate pain.     [provider]    Allergies Codeine    Social History Social History   Tobacco Use  . Smoking status: Never Smoker  . Smokeless tobacco: Never Used  Substance Use Topics  . Alcohol use: No  . Drug use: No    Review of Systems Patient denies headaches, rhinorrhea, blurry vision, numbness, shortness of breath, chest pain, edema, cough, abdominal pain, nausea, vomiting, diarrhea, dysuria, fevers, rashes or hallucinations unless otherwise stated above in HPI. ____________________________________________   PHYSICAL EXAM:  VITAL SIGNS: Vitals:   07/24/18 1113 07/24/18 1115  BP: (!) 162/84 (!) 162/84  Pulse:  75  Resp:  16  Temp:  98 F (36.7 C)  SpO2:  92%    Constitutional: Alert but disoriented.    Eyes:  Conjunctivae are normal.  Head: Atraumatic. No abrasion or contusion Nose: No congestion/rhinnorhea. Mouth/Throat: Mucous membranes are moist.   Neck: No stridor. Painless ROM.  Cardiovascular: Normal rate, regular rhythm. Grossly normal heart sounds.  Good peripheral circulation. Respiratory: Normal respiratory effort.  No retractions. Lungs CTAB. Gastrointestinal: Soft and nontender. No distention. No abdominal bruits. No CVA tenderness. Genitourinary:  Musculoskeletal: No lower extremity tenderness nor edema.  No joint effusions. Neurologic:   No gross focal neurologic deficits are appreciated. No facial droop Skin:  Skin is warm, dry and intact. No rash noted.  ____________________________________________   LABS (all labs ordered are listed, but only abnormal results are displayed)  No results found for this or any previous visit (from the past 24 hour(s)). ____________________________________________  ____________________________________________  RADIOLOGY  I personally reviewed all radiographic images ordered to evaluate for the above acute complaints and reviewed radiology reports and findings.  These findings were personally discussed with the patient.  Please see medical record for radiology report.  ____________________________________________   PROCEDURES  Procedure(s) performed:  Procedures    Critical Care performed: no ____________________________________________   INITIAL IMPRESSION / ASSESSMENT AND PLAN / ED COURSE  Pertinent labs & imaging results that were available during my care of the patient were reviewed by me and considered in my medical decision making (see chart for details).   DDX: sdh, iph, sah, concussion, fracture, contusion  Shelley Cameron is a 84 y.o. who presents to the ED with symptoms as described above.  Due to family concern and her high risk for head bleed patient will have CT imaging performed to evaluate for acute intracranial  abnormality.  No evidence of other associated injury.  Clinical Course as of Jul 24 1224  Thu Jul 24, 2018  1223 No evidence of AICA   [PR]    Clinical Course User Index [PR] Merlyn Lot, MD     As part of my medical decision making, I reviewed the following data within the [place] notes reviewed and incorporated, Labs reviewed, notes from prior ED visits.   ____________________________________________   FINAL CLINICAL IMPRESSION(S) / ED DIAGNOSES  Final diagnoses:  Fall, initial encounter      NEW MEDICATIONS STARTED DURING THIS VISIT:  New Prescriptions   No medications on file     Note:  This document was prepared using Dragon voice recognition software and may include unintentional dictation errors.    Merlyn Lot, MD 07/24/18 1225

## 2018-07-24 NOTE — Discharge Instructions (Signed)
Follow up with PCP.  Return for any additional questions or concerns. °

## 2018-07-24 NOTE — ED Triage Notes (Signed)
Pt was being moved to w/c? And bumped her head. Hx of brain bleed so family wanted her checked out. Dnr. A&ox1 is her normal. No abrasions, bumps or complaints with palpation

## 2018-07-26 DIAGNOSIS — G40909 Epilepsy, unspecified, not intractable, without status epilepticus: Secondary | ICD-10-CM | POA: Diagnosis not present

## 2018-07-26 DIAGNOSIS — I10 Essential (primary) hypertension: Secondary | ICD-10-CM | POA: Diagnosis not present

## 2018-07-26 DIAGNOSIS — I4891 Unspecified atrial fibrillation: Secondary | ICD-10-CM | POA: Diagnosis not present

## 2018-07-26 DIAGNOSIS — G894 Chronic pain syndrome: Secondary | ICD-10-CM | POA: Diagnosis not present

## 2018-07-26 DIAGNOSIS — M6281 Muscle weakness (generalized): Secondary | ICD-10-CM | POA: Diagnosis not present

## 2018-07-28 ENCOUNTER — Other Ambulatory Visit: Payer: Self-pay | Admitting: *Deleted

## 2018-07-28 NOTE — Patient Outreach (Signed)
[facility]) Care Management  07/28/2018  Shelley Cameron Sep 07, 1929 [id number]   Care coordination   [name] updated Cm on 07/23/18 1101 that Shelley Cameron went to the ED on 11/21-22/19 to for GI bleed and is doing better  Neurologist informed daughter can not accommodate a stretcher in the office Daughter gave list of all medications to renew so there does not have to be a neurology visit   [name] states she will update [place] called to speak with Elder care staff about respite care as the scheduled appointment was missed on 07/17/18   [facility] RN CM noted on 07/24/18 Shelley Cameron returned to the ED for a fall  [facility] RN CM called to speak with [name], daughter today about these last ED visits HIPAA verified Daughter confirms during a transfer on 07/24/18 the w/c went backwards as Shelley Cameron let go to [place]. Shelley Cameron hit her head. Her daughter, cousin and firemen assist to get pt up and to ED. ED found no issues and d/c pt home.  Shelley Cameron was seen by Elohim MD on 07/26/18 and re examined without issues noted Evaluated by for pain in left leg and recommendation to continue pain medications Now receiving emmi calls  [facility] nurse brought supplies to the home but the pt has not been seen since Wednesday 07/23/18  Rectal bleeding No further bleeding, off anticoagulant and on ASA only   [facility] RN Called [facility] to confirm her next home visit is for Tuesday 07/29/18   [facility] RN CM spoke with Colletta Maryland in [place] home care  [facility] RN CM spoke with Justice Rocher at main [facility] home care office and left a message at the DME retail store  [place] called to update [name] on [facility] RN visit for 07/29/18 and message left at [facility] Provided her a number for [facility] [place] office in case she gets a call  Discussed follow up call next week with plan for case closure if no further needs identified. She voiced understanding   Plans  follow up call next week with plan for case  closure if no further needs identified.  Routed note to MD   [name] L. Lavina Hamman, RN, BSN, [place] Coordinator Office number [phone] Mobile number [phone]  Main [facility] number [phone] Fax number [phone]

## 2018-07-29 DIAGNOSIS — I5020 Unspecified systolic (congestive) heart failure: Secondary | ICD-10-CM | POA: Diagnosis not present

## 2018-07-29 DIAGNOSIS — G40401 Other generalized epilepsy and epileptic syndromes, not intractable, with status epilepticus: Secondary | ICD-10-CM | POA: Diagnosis not present

## 2018-07-29 DIAGNOSIS — F0391 Unspecified dementia with behavioral disturbance: Secondary | ICD-10-CM | POA: Diagnosis not present

## 2018-07-29 DIAGNOSIS — I824Y2 Acute embolism and thrombosis of unspecified deep veins of left proximal lower extremity: Secondary | ICD-10-CM | POA: Diagnosis not present

## 2018-07-29 DIAGNOSIS — L89141 Pressure ulcer of left lower back, stage 1: Secondary | ICD-10-CM | POA: Diagnosis not present

## 2018-07-29 DIAGNOSIS — I82493 Acute embolism and thrombosis of other specified deep vein of lower extremity, bilateral: Secondary | ICD-10-CM | POA: Diagnosis not present

## 2018-07-29 DIAGNOSIS — F0390 Unspecified dementia without behavioral disturbance: Secondary | ICD-10-CM | POA: Diagnosis not present

## 2018-07-29 DIAGNOSIS — I110 Hypertensive heart disease with heart failure: Secondary | ICD-10-CM | POA: Diagnosis not present

## 2018-07-29 DIAGNOSIS — G40909 Epilepsy, unspecified, not intractable, without status epilepticus: Secondary | ICD-10-CM | POA: Diagnosis not present

## 2018-07-29 DIAGNOSIS — E119 Type 2 diabetes mellitus without complications: Secondary | ICD-10-CM | POA: Diagnosis not present

## 2018-07-29 DIAGNOSIS — I6931 Attention and concentration deficit following cerebral infarction: Secondary | ICD-10-CM | POA: Diagnosis not present

## 2018-07-29 DIAGNOSIS — I482 Chronic atrial fibrillation, unspecified: Secondary | ICD-10-CM | POA: Diagnosis not present

## 2018-07-29 DIAGNOSIS — M6281 Muscle weakness (generalized): Secondary | ICD-10-CM | POA: Diagnosis not present

## 2018-07-29 DIAGNOSIS — L89610 Pressure ulcer of right heel, unstageable: Secondary | ICD-10-CM | POA: Diagnosis not present

## 2018-07-30 ENCOUNTER — Ambulatory Visit: Payer: Self-pay | Admitting: *Deleted

## 2018-07-30 ENCOUNTER — Other Ambulatory Visit: Payer: Self-pay | Admitting: *Deleted

## 2018-07-30 NOTE — Patient Outreach (Signed)
[facility]) Care Management  07/30/2018  Shelley Cameron Apr 28, 1930 [id number]   Care coordination  [facility] RN CM received a message from Shelley Cameron's daughter, Shelley Cameron about assistance with a Carron Brazen chair for Shelley Memoli. [name] reports that [facility] home health staff, Shelley Cameron and Shelley Cameron, Carron Brazen chair representative discussed the benefits of a Carron Brazen chair for the patient and provided a video.  Daughter requested Cm call Shelley Cameron [facility] RN CM called Shelley Cameron and spoke with him at [phone] and recommended he follow his authorization process for obtaining the chair and clarified that CM does not work for [employer] but [place] RN [place] spoke with Shelley Cameron briefly as she was at a MD appointment to update her on the call to Shelley Cameron and informed her of the process Shelley Cameron standardly needs to complete and encouraged her to call back with questions prn   Cm still has a pending call from [facility] retail office staff about assistance in replacing the wheelchair they provided to Shelley Manetta  Plans  follow up call next week with plan for case closure if no further needs identified.  Shelley Felten L. Lavina Hamman, RN, BSN, [place] Coordinator Office number [phone] Mobile number [phone]  Main [facility] number [phone] Fax number [phone]

## 2018-08-01 ENCOUNTER — Ambulatory Visit: Payer: Self-pay | Admitting: *Deleted

## 2018-08-04 DIAGNOSIS — I824Y2 Acute embolism and thrombosis of unspecified deep veins of left proximal lower extremity: Secondary | ICD-10-CM | POA: Diagnosis not present

## 2018-08-04 DIAGNOSIS — F0390 Unspecified dementia without behavioral disturbance: Secondary | ICD-10-CM | POA: Diagnosis not present

## 2018-08-04 DIAGNOSIS — M6281 Muscle weakness (generalized): Secondary | ICD-10-CM | POA: Diagnosis not present

## 2018-08-04 DIAGNOSIS — G40401 Other generalized epilepsy and epileptic syndromes, not intractable, with status epilepticus: Secondary | ICD-10-CM | POA: Diagnosis not present

## 2018-08-04 DIAGNOSIS — I82493 Acute embolism and thrombosis of other specified deep vein of lower extremity, bilateral: Secondary | ICD-10-CM | POA: Diagnosis not present

## 2018-08-04 DIAGNOSIS — F0391 Unspecified dementia with behavioral disturbance: Secondary | ICD-10-CM | POA: Diagnosis not present

## 2018-08-05 ENCOUNTER — Other Ambulatory Visit: Payer: Self-pay | Admitting: *Deleted

## 2018-08-05 NOTE — Patient Outreach (Addendum)
[facility]) Care Management  08/05/2018  Shelley Cameron 1930/07/22 [id number]   Care coordination/case closure  Contact with Shelley Cameron was re started for EMMI stroke on 07/07/18 Follow up calls have been made since 07/07/18 for care coordination  [facility] RN CM left another message at [facility] DME department for the [facility] office [phone] extension   [facility] RN CM was able to reach and speak with Shelley Cameron at [facility] at [phone]. Shelley Cameron confirms pt has a monthly rental of a transport care since 04/17/18. Shelley Cameron shared that [name], daughter can bring the transport chair to the local [employer] store for repair or replacement prn  Shelley Cameron shared details of the transport and shared a reminder that pt/family need to lock both back wheel brakes. Shelley Cameron Dr confirms a transport chair is only use for transport. Shelley Cameron confirms Shelley Cameron came to discuss renting a w/c ramp on 07/07/18. Confirms another new chair during 2019 will become an out of pocket expense for the patient and a new chair would require and order from a MD to meet the medicare requirement of one chair in 5 year time frame  Call to Shelley Cameron who is able to verify HIPAA Reviewed and addressed purpose for call to follow up last calls & DME    Consent: [name] RN CM reviewed Shelley Cameron services with patient. Patient gave verbal consent for services.  [facility] missed Monday visit but the visits changed from twice to once a week. [facility] will be visiting later in the week [facility] spoke with Shelley Cameron on 08/02/18  Pt refusing to take medicine for am and not drink fluids Daughter is trying different methods, applesauce , food, returning to re offer medicines, etc Discussed the main medications to attempt to provide to include keppra and HTN medicines  Allowed daughter time to ventilate  Discuss call to [facility] Discussed differences in transport and standard w/c Door frames of the home is 31 inches wide    Discussed foot care for odor and cracking Elder care staff, Shelley Cameron, to donate items family to pick up   CM consulted with Shelley Cameron of [facility] to get recommendations for new w/c needs Shelley Cameron recommends a standard w/c with Anti tipper, W/c locks, elevated leg rests,   Seat cushion, W/c seat belt, and a seat measuring at 18 inches which is closer to the size of present transport chair seat   Called [facility] calls [phone] Left message to include CM contact information   Plan [facility] RN CM will close case at this time as patient has been assessed and no further needs identified.  Shelley Cameron, daughter agreed and voiced understanding   Pt encouraged to return a call to [name] prn- Shelley Cameron confirmed she would return a call as needed  Routed note to MDs (E[facility] care team)    Shelley Cameron L. Lavina Hamman, RN, BSN, [place] Coordinator Office number [phone] Mobile number [phone]  Main [facility] number [phone] Fax number [phone]

## 2018-08-13 DIAGNOSIS — I482 Chronic atrial fibrillation, unspecified: Secondary | ICD-10-CM | POA: Diagnosis not present

## 2018-08-13 DIAGNOSIS — I5020 Unspecified systolic (congestive) heart failure: Secondary | ICD-10-CM | POA: Diagnosis not present

## 2018-08-13 DIAGNOSIS — F0391 Unspecified dementia with behavioral disturbance: Secondary | ICD-10-CM | POA: Diagnosis not present

## 2018-08-13 DIAGNOSIS — I6931 Attention and concentration deficit following cerebral infarction: Secondary | ICD-10-CM | POA: Diagnosis not present

## 2018-08-13 DIAGNOSIS — G40909 Epilepsy, unspecified, not intractable, without status epilepticus: Secondary | ICD-10-CM | POA: Diagnosis not present

## 2018-08-13 DIAGNOSIS — L89141 Pressure ulcer of left lower back, stage 1: Secondary | ICD-10-CM | POA: Diagnosis not present

## 2018-08-13 DIAGNOSIS — L89610 Pressure ulcer of right heel, unstageable: Secondary | ICD-10-CM | POA: Diagnosis not present

## 2018-08-13 DIAGNOSIS — I110 Hypertensive heart disease with heart failure: Secondary | ICD-10-CM | POA: Diagnosis not present

## 2018-08-13 DIAGNOSIS — E119 Type 2 diabetes mellitus without complications: Secondary | ICD-10-CM | POA: Diagnosis not present

## 2018-08-17 DIAGNOSIS — G40401 Other generalized epilepsy and epileptic syndromes, not intractable, with status epilepticus: Secondary | ICD-10-CM | POA: Diagnosis not present

## 2018-08-17 DIAGNOSIS — M6281 Muscle weakness (generalized): Secondary | ICD-10-CM | POA: Diagnosis not present

## 2018-08-17 DIAGNOSIS — F0390 Unspecified dementia without behavioral disturbance: Secondary | ICD-10-CM | POA: Diagnosis not present

## 2018-08-17 DIAGNOSIS — I82493 Acute embolism and thrombosis of other specified deep vein of lower extremity, bilateral: Secondary | ICD-10-CM | POA: Diagnosis not present

## 2018-08-21 DIAGNOSIS — I5020 Unspecified systolic (congestive) heart failure: Secondary | ICD-10-CM | POA: Diagnosis not present

## 2018-08-21 DIAGNOSIS — I69354 Hemiplegia and hemiparesis following cerebral infarction affecting left non-dominant side: Secondary | ICD-10-CM | POA: Diagnosis not present

## 2018-08-21 DIAGNOSIS — I110 Hypertensive heart disease with heart failure: Secondary | ICD-10-CM | POA: Diagnosis not present

## 2018-08-21 DIAGNOSIS — I69391 Dysphagia following cerebral infarction: Secondary | ICD-10-CM | POA: Diagnosis not present

## 2018-08-23 DIAGNOSIS — I110 Hypertensive heart disease with heart failure: Secondary | ICD-10-CM | POA: Diagnosis not present

## 2018-08-30 DIAGNOSIS — Z7901 Long term (current) use of anticoagulants: Secondary | ICD-10-CM | POA: Diagnosis not present

## 2018-08-30 DIAGNOSIS — Z7902 Long term (current) use of antithrombotics/antiplatelets: Secondary | ICD-10-CM | POA: Diagnosis not present

## 2018-08-30 DIAGNOSIS — I10 Essential (primary) hypertension: Secondary | ICD-10-CM | POA: Diagnosis not present

## 2018-08-30 DIAGNOSIS — I4891 Unspecified atrial fibrillation: Secondary | ICD-10-CM | POA: Diagnosis not present

## 2018-08-30 DIAGNOSIS — R569 Unspecified convulsions: Secondary | ICD-10-CM | POA: Diagnosis not present

## 2018-08-30 DIAGNOSIS — G894 Chronic pain syndrome: Secondary | ICD-10-CM | POA: Diagnosis not present

## 2018-09-12 ENCOUNTER — Other Ambulatory Visit: Payer: Self-pay

## 2018-09-12 NOTE — Patient Outreach (Signed)
Telephone outreach to patient to obtain mRs was successfully completed. mRs= 5.  Spoke with daughter [name] to obtain score. She is on the [facility] Patient is home with hospice.

## 2018-09-25 DIAGNOSIS — I110 Hypertensive heart disease with heart failure: Secondary | ICD-10-CM | POA: Diagnosis not present

## 2018-10-04 IMAGING — CT CT HEAD W/O CM
3 of 4 series · 15 of 47 positions shown, 18 images · non-contrast
Comparison: CT scan March 06, 2018

CLINICAL DATA: History of dementia.  Seizure.

EXAM:
CT HEAD WITHOUT CONTRAST
TECHNIQUE: Contiguous axial images were obtained from the base of the skull
through the vertex without intravenous contrast.

[Series 2: head wo · axial · 0.47mm/px · z∈[-167,-52]mm · 9 of 29 slices shown, 12 images]
[im 3/29  brain]
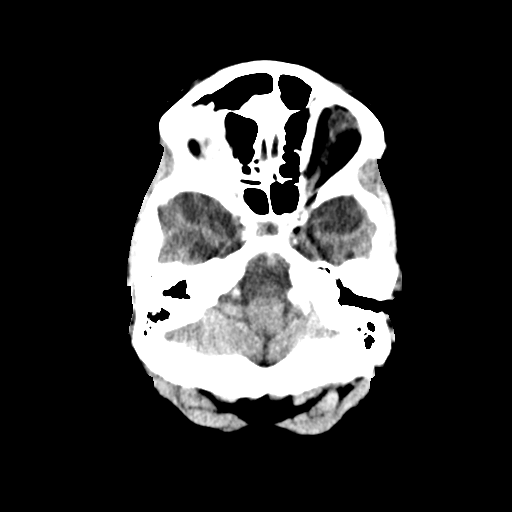
[im 3/29  bone]
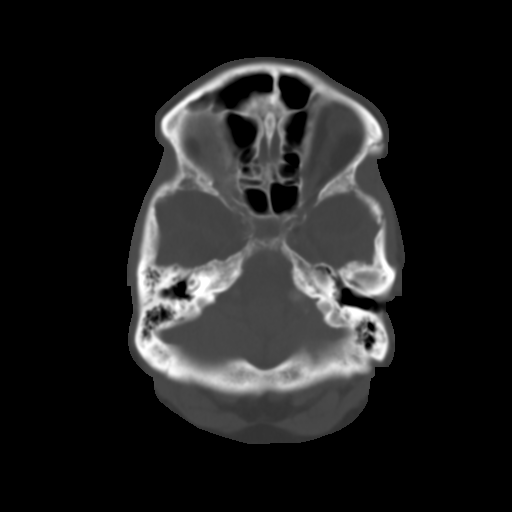
[im 6/29  brain]
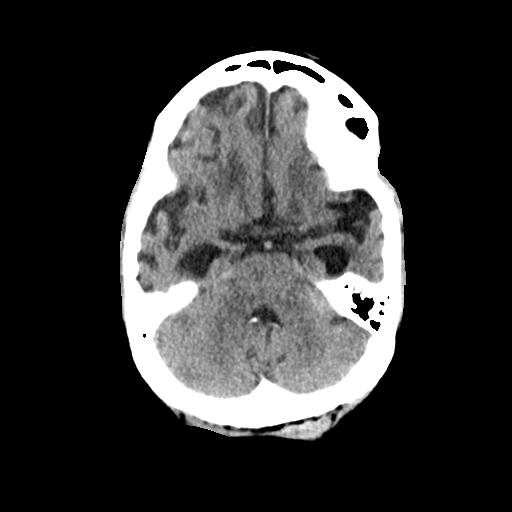
[im 8/29  brain]
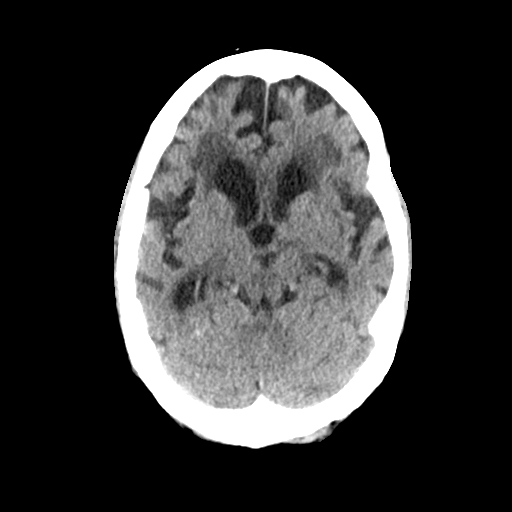
[im 12/29  brain]
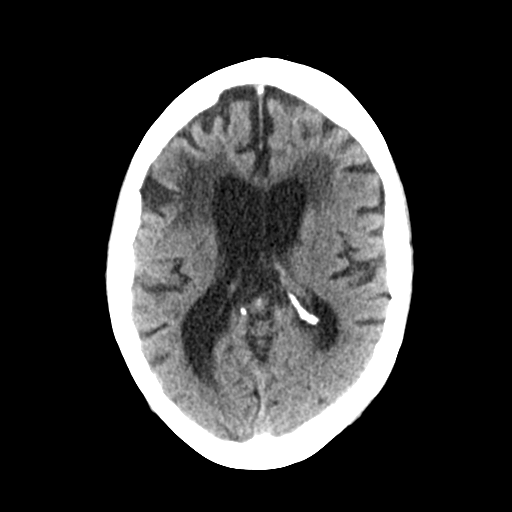
[im 15/29  brain]
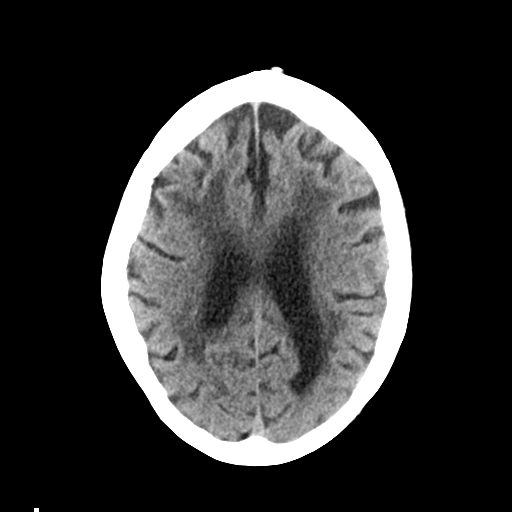
[im 15/29  bone]
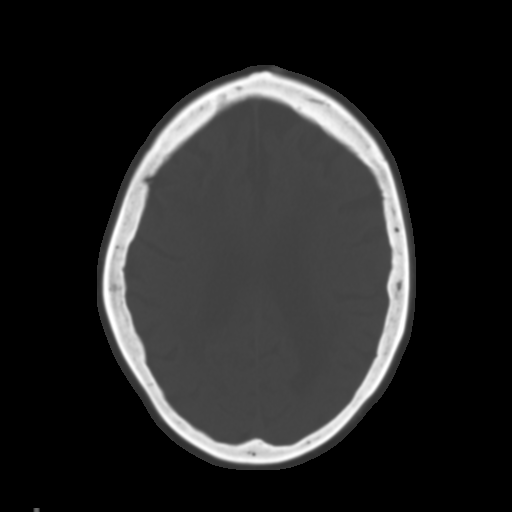
[im 17/29  brain]
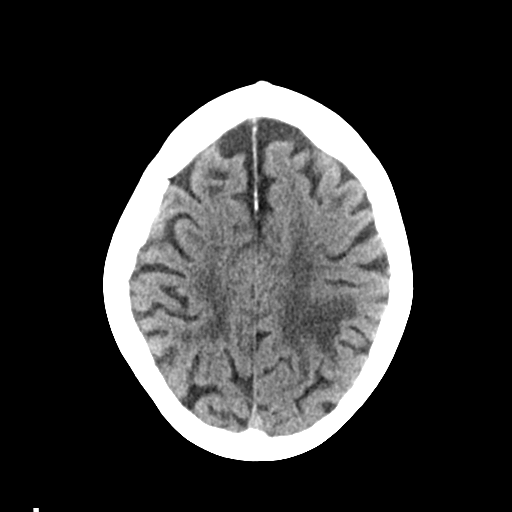
[im 21/29  brain]
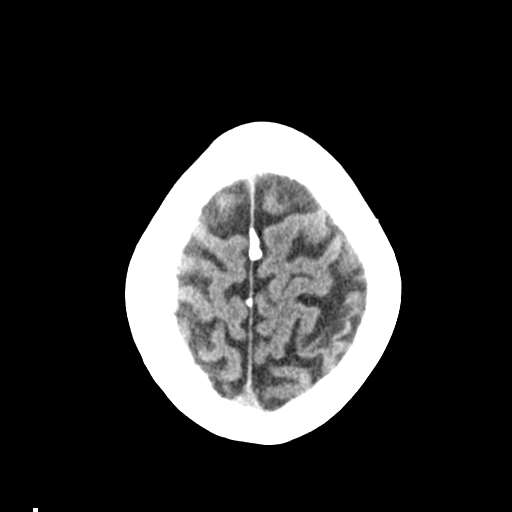
[im 24/29  brain]
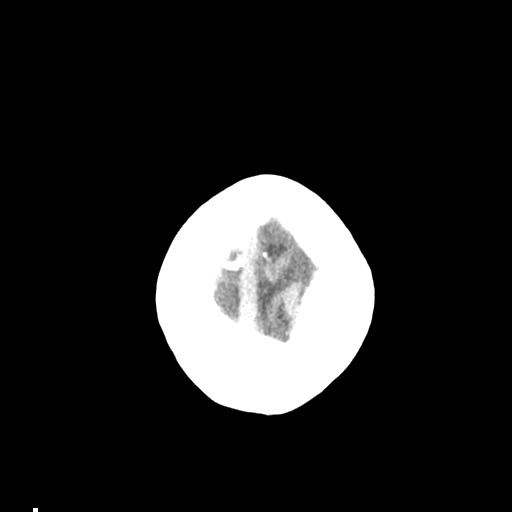
[im 26/29  brain]
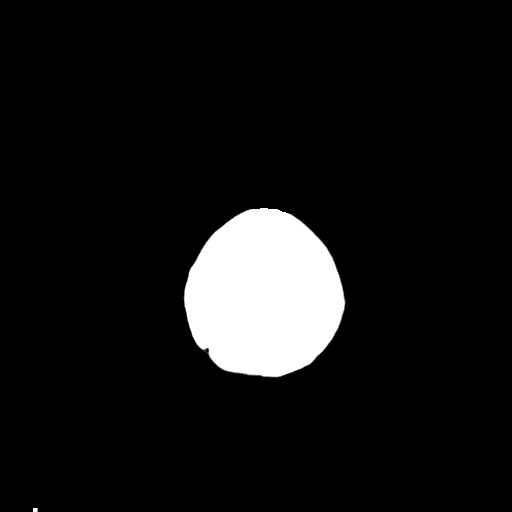
[im 26/29  bone]
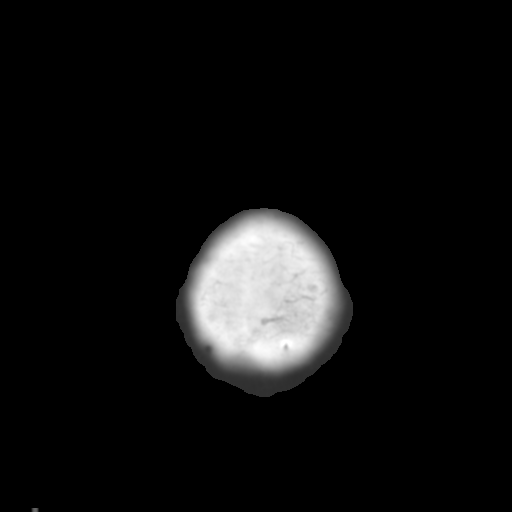

[Series 4: coronal soft tissue · coronal · 0.28mm/px · 3 of 69 slices shown]
[im 23/69  brain]
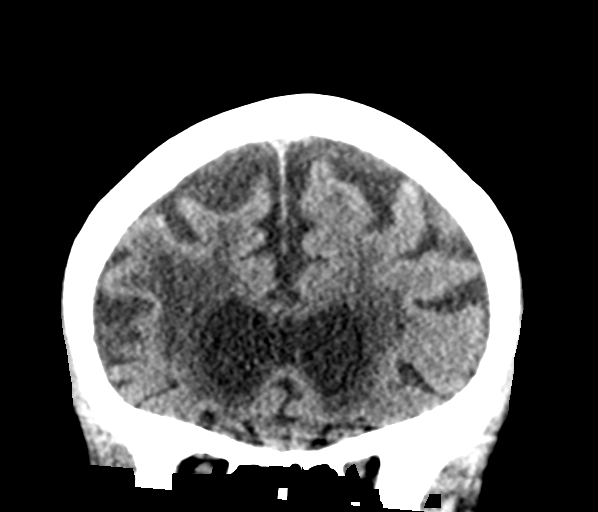
[im 31/69  brain]
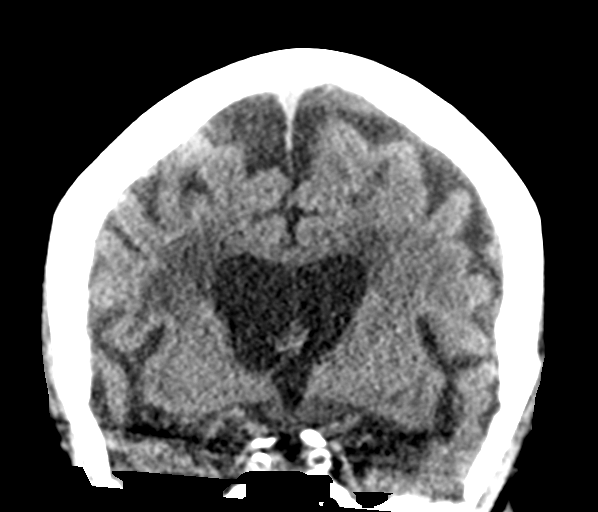
[im 38/69  brain]
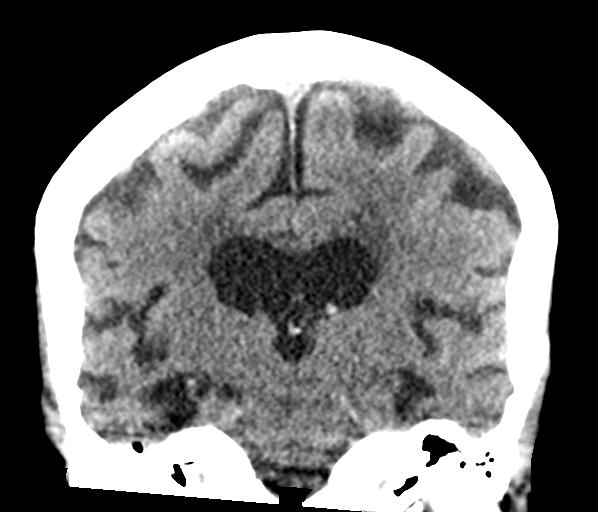

[Series 5: sagittal soft tissue · sagittal · 0.28mm/px · 3 of 50 slices shown]
[im 17/50  brain]
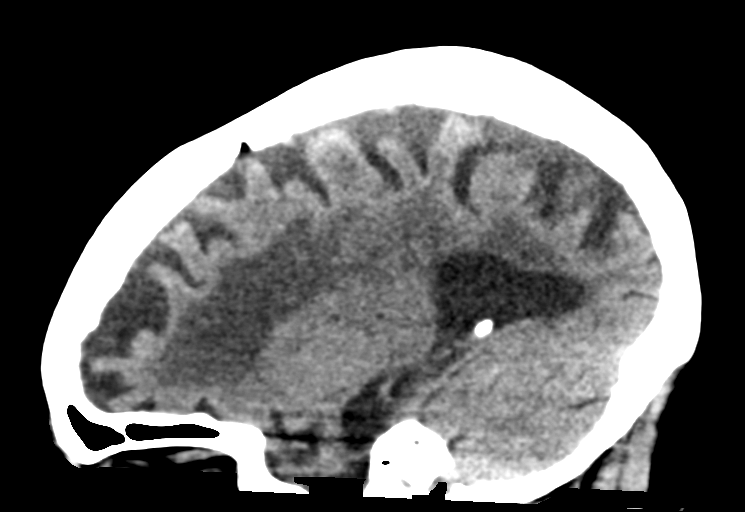
[im 25/50  brain]
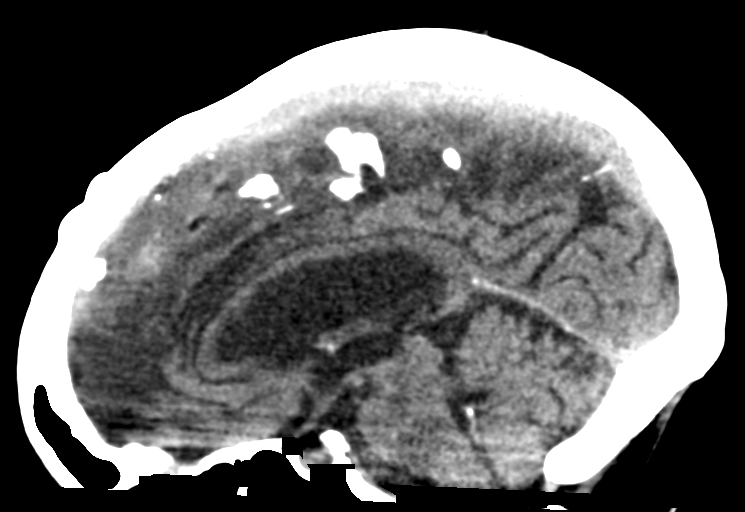
[im 33/50  brain]
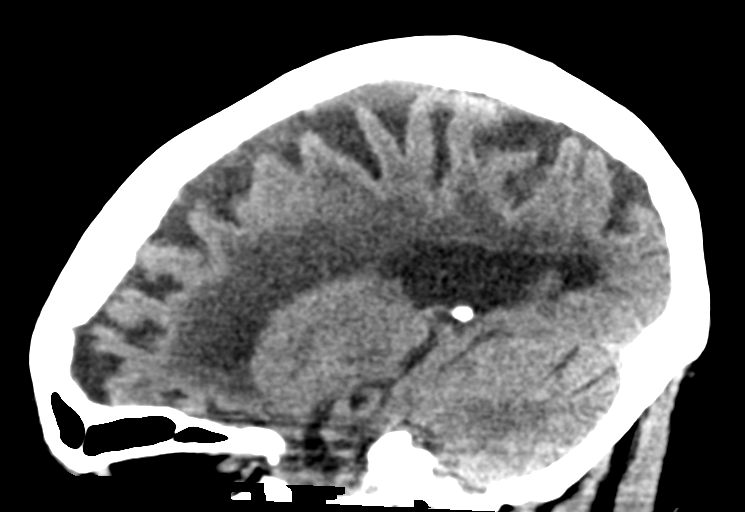

[15 of 47 positions shown; findings below may reference images not displayed]

FINDINGS: Brain: No subdural, epidural, or subarachnoid hemorrhage.
Cerebellum, brainstem, and basal cisterns are normal. Ventricles and
sulci are prominent but stable. White matter changes are moderate to
severe and stable. No acute cortical ischemia or infarct identified.
No mass effect or midline shift.

Vascular: No hyperdense vessel or unexpected calcification.

Skull: Normal. Negative for fracture or focal lesion.

Sinuses/Orbits: No acute finding.

Other: None.
IMPRESSION: Volume loss with prominent sulci and ventricles, stable. Stable
white matter changes. No acute intracranial abnormality.

## 2018-10-26 DEATH — deceased

## 2018-11-26 ENCOUNTER — Telehealth: Payer: Self-pay

## 2018-11-26 NOTE — Telephone Encounter (Signed)
I called [facility] to speak with Cathe Mons about forms to sign for therapy orders. The rep stated she was not in the office but can send her a message I also stated pt never follow up because daughter told schedulers she was home bound. I stated we have orders for Dr. Leonie Man to sign for wound care. I stated he does not manage wound care and those orders need to be sent to her PCP. I stated they need to revised the form without the wound care. They will refax the forms without the wound orders.
